# Patient Record
Sex: Female | Born: 1975 | Race: White | Hispanic: No | Marital: Married | Smoking: Never smoker
Health system: Southern US, Community
[De-identification: ages and names within clinical notes are randomized; demographics above are authoritative.]

## PROBLEM LIST (undated history)

## (undated) DIAGNOSIS — C801 Malignant (primary) neoplasm, unspecified: Secondary | ICD-10-CM

## (undated) DIAGNOSIS — N979 Female infertility, unspecified: Secondary | ICD-10-CM

## (undated) DIAGNOSIS — M1990 Unspecified osteoarthritis, unspecified site: Secondary | ICD-10-CM

## (undated) DIAGNOSIS — I739 Peripheral vascular disease, unspecified: Secondary | ICD-10-CM

## (undated) DIAGNOSIS — F329 Major depressive disorder, single episode, unspecified: Secondary | ICD-10-CM

## (undated) DIAGNOSIS — F32A Depression, unspecified: Secondary | ICD-10-CM

## (undated) DIAGNOSIS — E0590 Thyrotoxicosis, unspecified without thyrotoxic crisis or storm: Secondary | ICD-10-CM

## (undated) DIAGNOSIS — E119 Type 2 diabetes mellitus without complications: Secondary | ICD-10-CM

## (undated) DIAGNOSIS — D6851 Activated protein C resistance: Principal | ICD-10-CM

## (undated) DIAGNOSIS — D759 Disease of blood and blood-forming organs, unspecified: Secondary | ICD-10-CM

## (undated) DIAGNOSIS — I10 Essential (primary) hypertension: Secondary | ICD-10-CM

## (undated) DIAGNOSIS — R7303 Prediabetes: Secondary | ICD-10-CM

## (undated) DIAGNOSIS — R600 Localized edema: Secondary | ICD-10-CM

## (undated) DIAGNOSIS — G4730 Sleep apnea, unspecified: Secondary | ICD-10-CM

## (undated) DIAGNOSIS — M549 Dorsalgia, unspecified: Secondary | ICD-10-CM

## (undated) DIAGNOSIS — F419 Anxiety disorder, unspecified: Secondary | ICD-10-CM

## (undated) DIAGNOSIS — R51 Headache: Secondary | ICD-10-CM

## (undated) DIAGNOSIS — E282 Polycystic ovarian syndrome: Secondary | ICD-10-CM

## (undated) HISTORY — DX: Activated protein C resistance: D68.51

## (undated) HISTORY — DX: Essential (primary) hypertension: I10

## (undated) HISTORY — PX: TONSILLECTOMY: SUR1361

## (undated) HISTORY — DX: Major depressive disorder, single episode, unspecified: F32.9

## (undated) HISTORY — DX: Depression, unspecified: F32.A

## (undated) HISTORY — DX: Dorsalgia, unspecified: M54.9

## (undated) HISTORY — DX: Sleep apnea, unspecified: G47.30

## (undated) HISTORY — DX: Female infertility, unspecified: N97.9

## (undated) HISTORY — DX: Localized edema: R60.0

## (undated) HISTORY — PX: DILATION AND CURETTAGE OF UTERUS: SHX78

## (undated) HISTORY — DX: Prediabetes: R73.03

## (undated) HISTORY — DX: Polycystic ovarian syndrome: E28.2

---

## 1997-09-06 ENCOUNTER — Emergency Department (HOSPITAL_COMMUNITY): Admission: EM | Admit: 1997-09-06 | Discharge: 1997-09-06 | Payer: Self-pay | Admitting: Emergency Medicine

## 1998-07-11 ENCOUNTER — Ambulatory Visit (HOSPITAL_COMMUNITY): Admission: RE | Admit: 1998-07-11 | Discharge: 1998-07-11 | Payer: Self-pay | Admitting: Internal Medicine

## 1998-07-11 ENCOUNTER — Encounter: Payer: Self-pay | Admitting: Internal Medicine

## 1999-03-13 ENCOUNTER — Other Ambulatory Visit: Admission: RE | Admit: 1999-03-13 | Discharge: 1999-03-13 | Payer: Self-pay | Admitting: Obstetrics and Gynecology

## 1999-05-10 ENCOUNTER — Emergency Department (HOSPITAL_COMMUNITY): Admission: EM | Admit: 1999-05-10 | Discharge: 1999-05-10 | Payer: Self-pay | Admitting: Emergency Medicine

## 2000-07-28 ENCOUNTER — Ambulatory Visit (HOSPITAL_COMMUNITY): Admission: RE | Admit: 2000-07-28 | Discharge: 2000-07-28 | Payer: Self-pay | Admitting: Internal Medicine

## 2002-08-31 ENCOUNTER — Encounter: Payer: Self-pay | Admitting: Neurology

## 2002-08-31 ENCOUNTER — Ambulatory Visit (HOSPITAL_COMMUNITY): Admission: RE | Admit: 2002-08-31 | Discharge: 2002-08-31 | Payer: Self-pay | Admitting: Neurology

## 2002-09-05 ENCOUNTER — Ambulatory Visit (HOSPITAL_COMMUNITY): Admission: RE | Admit: 2002-09-05 | Discharge: 2002-09-05 | Payer: Self-pay | Admitting: Neurology

## 2002-09-05 ENCOUNTER — Encounter: Payer: Self-pay | Admitting: Neurology

## 2002-09-13 ENCOUNTER — Encounter: Payer: Self-pay | Admitting: Neurology

## 2002-09-13 ENCOUNTER — Ambulatory Visit: Admission: RE | Admit: 2002-09-13 | Discharge: 2002-09-13 | Payer: Self-pay | Admitting: Neurology

## 2003-04-17 ENCOUNTER — Other Ambulatory Visit: Admission: RE | Admit: 2003-04-17 | Discharge: 2003-04-17 | Payer: Self-pay | Admitting: Obstetrics and Gynecology

## 2003-06-06 ENCOUNTER — Encounter: Admission: RE | Admit: 2003-06-06 | Discharge: 2003-07-27 | Payer: Self-pay | Admitting: Internal Medicine

## 2004-05-24 ENCOUNTER — Other Ambulatory Visit: Admission: RE | Admit: 2004-05-24 | Discharge: 2004-05-24 | Payer: Self-pay | Admitting: Obstetrics and Gynecology

## 2005-05-12 ENCOUNTER — Inpatient Hospital Stay (HOSPITAL_COMMUNITY): Admission: AD | Admit: 2005-05-12 | Discharge: 2005-05-12 | Payer: Self-pay | Admitting: Obstetrics and Gynecology

## 2005-05-29 ENCOUNTER — Inpatient Hospital Stay (HOSPITAL_COMMUNITY): Admission: AD | Admit: 2005-05-29 | Discharge: 2005-06-02 | Payer: Self-pay | Admitting: Obstetrics and Gynecology

## 2005-07-10 ENCOUNTER — Other Ambulatory Visit: Admission: RE | Admit: 2005-07-10 | Discharge: 2005-07-10 | Payer: Self-pay | Admitting: Obstetrics and Gynecology

## 2007-10-07 ENCOUNTER — Encounter: Admission: RE | Admit: 2007-10-07 | Discharge: 2007-10-07 | Payer: Self-pay | Admitting: Internal Medicine

## 2007-10-14 ENCOUNTER — Encounter: Admission: RE | Admit: 2007-10-14 | Discharge: 2007-10-14 | Payer: Self-pay | Admitting: Internal Medicine

## 2008-07-05 ENCOUNTER — Encounter: Admission: RE | Admit: 2008-07-05 | Discharge: 2008-07-05 | Payer: Self-pay | Admitting: Obstetrics and Gynecology

## 2009-07-06 ENCOUNTER — Encounter: Admission: RE | Admit: 2009-07-06 | Discharge: 2009-07-06 | Payer: Self-pay | Admitting: Internal Medicine

## 2009-07-31 IMAGING — MG MM SCREEN MAMMOGRAM BILATERAL
6 series · 6 of 6 positions shown · non-contrast
Comparison: none

DG SCREEN MAMMOGRAM BILATERAL
Bilateral CC and MLO view(s) were taken.

DIGITAL SCREENING MAMMOGRAM WITH CAD:
The breast tissue is almost entirely fatty.  No masses or malignant type calcifications are 
identified.

[R CC (1 of 2)]
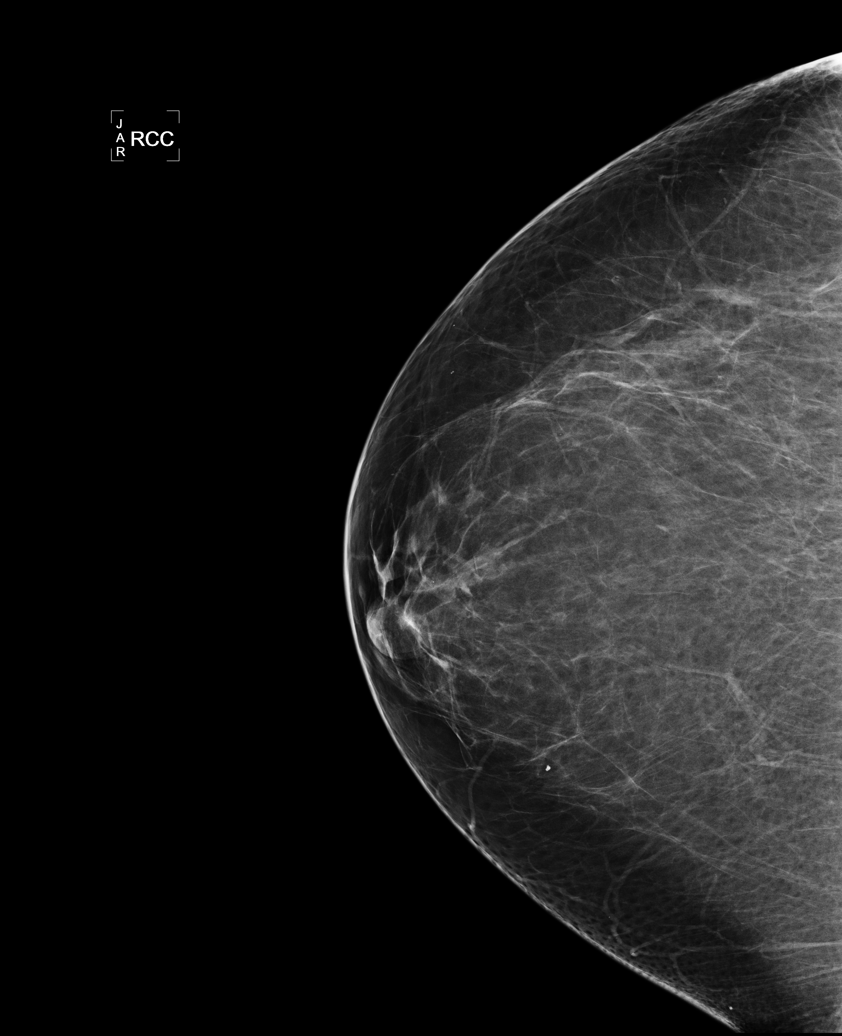

[L CC (1 of 2)]
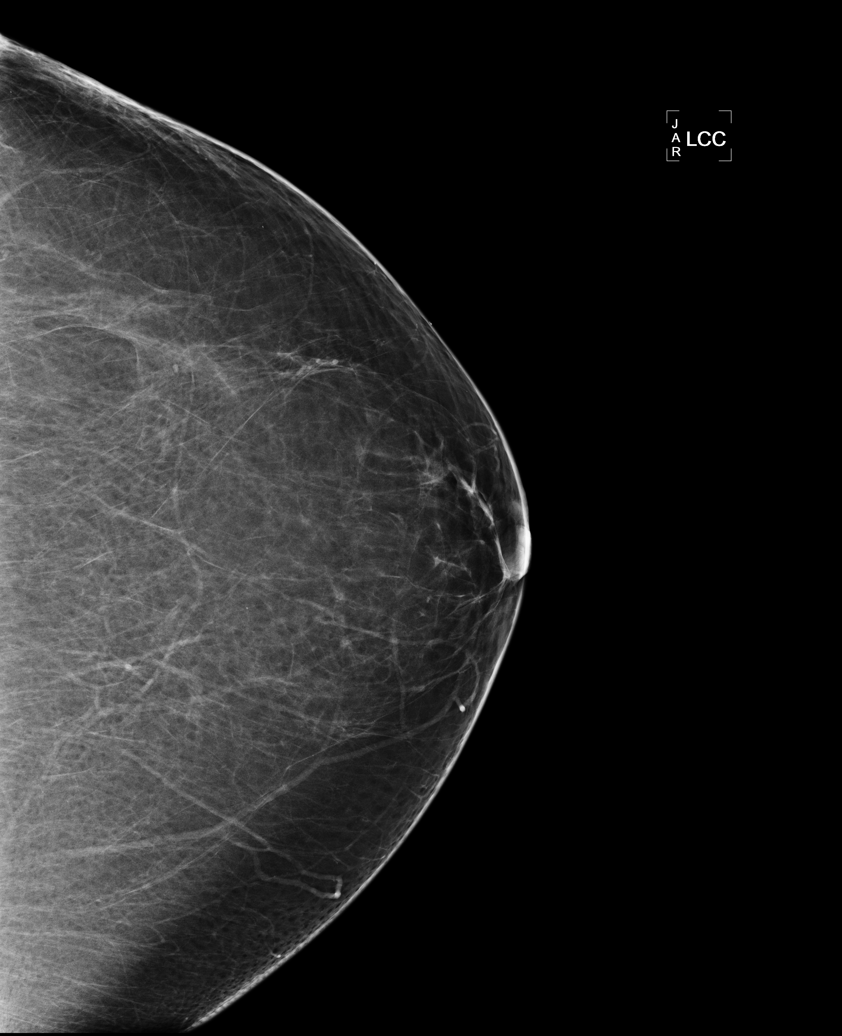

[L MLO]
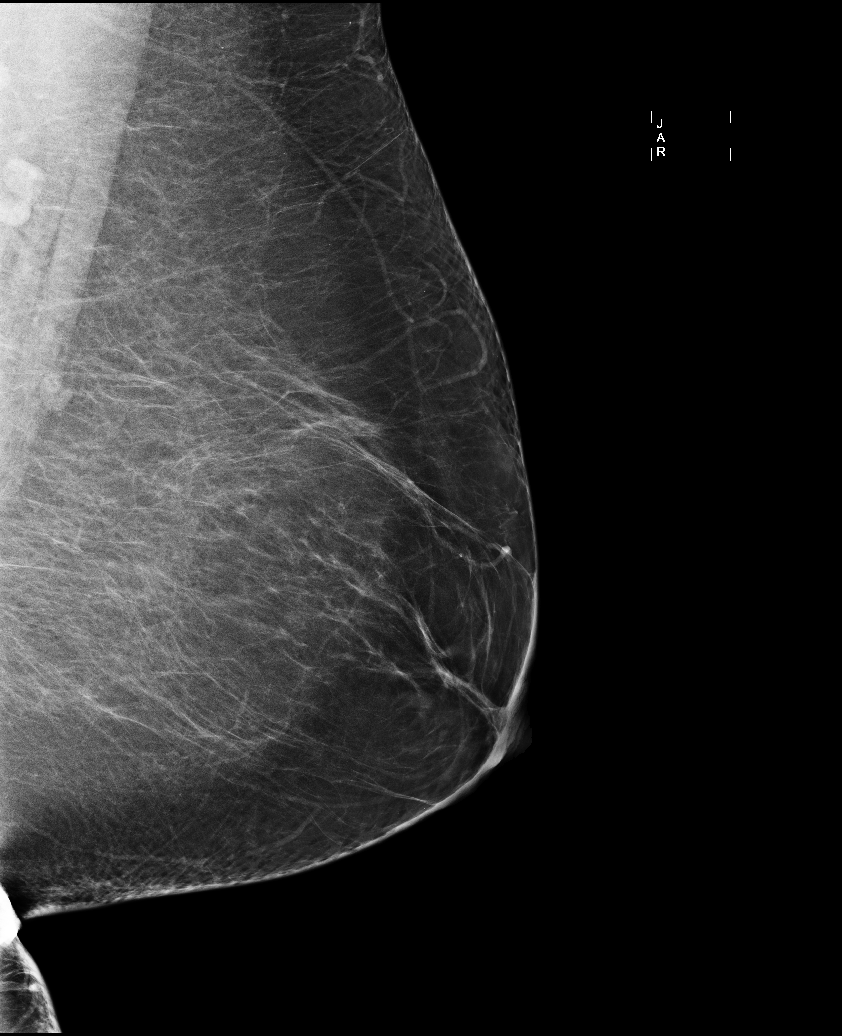

[R MLO]
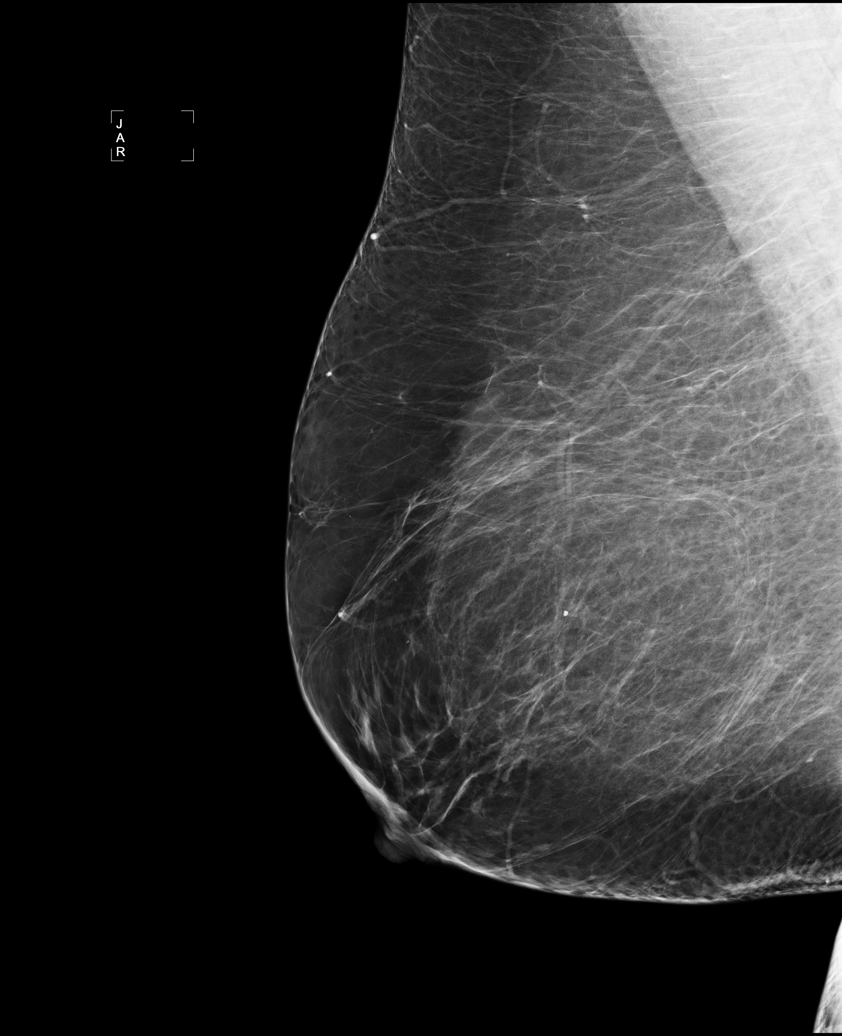

[R CC (2 of 2)]
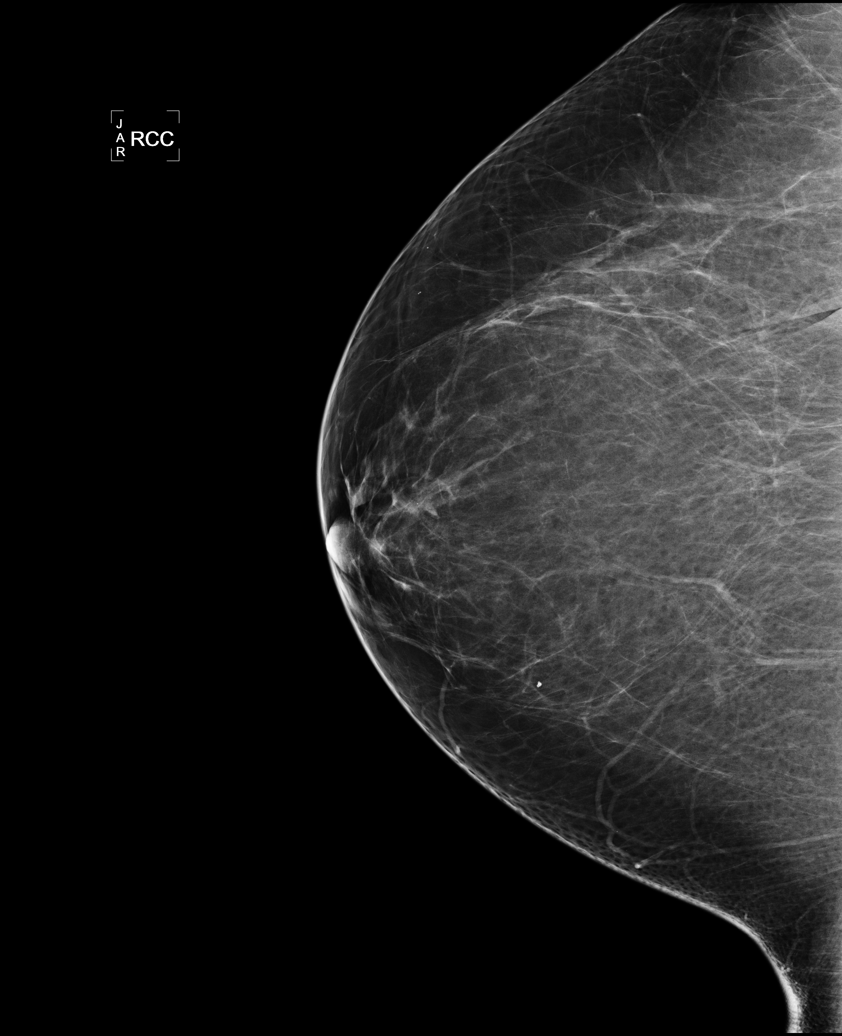

[L CC (2 of 2)]
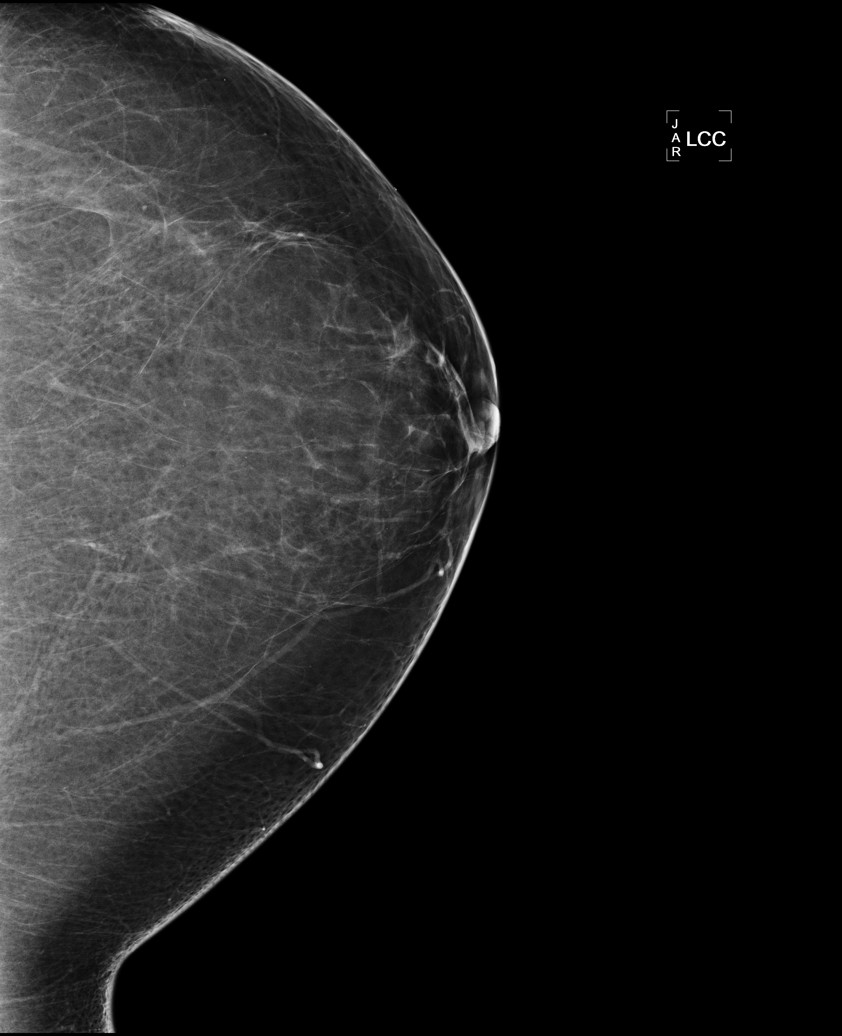

[6 of 6 positions shown; findings below may reference images not displayed]

IMPRESSION: No specific mammographic evidence of malignancy.  Next screening mammogram is recommended in one 
year.

A result letter of this screening mammogram will be mailed directly to the patient.

ASSESSMENT: Negative - BI-RADS 1

Screening mammogram in 1 year.
ANALYZED BY COMPUTER AIDED DETECTION. , THIS PROCEDURE WAS A DIGITAL MAMMOGRAM.

## 2010-04-10 ENCOUNTER — Other Ambulatory Visit: Payer: Self-pay | Admitting: Neurology

## 2010-04-17 ENCOUNTER — Ambulatory Visit
Admission: RE | Admit: 2010-04-17 | Discharge: 2010-04-17 | Disposition: A | Discharge status: Home / Self Care | Payer: Managed Care, Other (non HMO) | Source: Ambulatory Visit | Attending: Neurology | Admitting: Neurology

## 2010-04-17 DIAGNOSIS — R519 Headache, unspecified: Secondary | ICD-10-CM

## 2010-04-17 MED ORDER — GADOBENATE DIMEGLUMINE 529 MG/ML IV SOLN
20.0000 mL | Freq: Once | INTRAVENOUS | Status: AC | PRN
  Administered 2010-04-17: 20 mL via INTRAVENOUS

## 2010-06-17 ENCOUNTER — Other Ambulatory Visit: Payer: Self-pay | Admitting: Internal Medicine

## 2010-06-17 DIAGNOSIS — N63 Unspecified lump in unspecified breast: Secondary | ICD-10-CM

## 2010-06-17 DIAGNOSIS — Z1231 Encounter for screening mammogram for malignant neoplasm of breast: Secondary | ICD-10-CM

## 2010-07-01 ENCOUNTER — Ambulatory Visit
Admission: RE | Admit: 2010-07-01 | Discharge: 2010-07-01 | Disposition: A | Discharge status: Home / Self Care | Payer: Managed Care, Other (non HMO) | Source: Ambulatory Visit | Attending: Internal Medicine | Admitting: Internal Medicine

## 2010-07-01 ENCOUNTER — Other Ambulatory Visit: Payer: Self-pay | Admitting: Internal Medicine

## 2010-07-01 DIAGNOSIS — N63 Unspecified lump in unspecified breast: Secondary | ICD-10-CM

## 2010-07-08 ENCOUNTER — Ambulatory Visit: Payer: Managed Care, Other (non HMO)

## 2010-07-26 NOTE — Op Note (Signed)
Katie Harris, Katie Harris                ACCOUNT NO.:  [id number]   MEDICAL RECORD NO.:  [id number]          PATIENT TYPE:  INP   LOCATION:  9143                          FACILITY:  [facility]   PHYSICIAN:  Huel Cote, M.D. DATE OF BIRTH:  03/30/1975   DATE OF PROCEDURE:  05/29/2005  DATE OF DISCHARGE:                                 OPERATIVE REPORT   PREOPERATIVE DIAGNOSES:  1.  Term pregnancy at 39+ weeks.  2.  Arrest of dilation.  3.  Obesity.  4.  Maternal fever.   POSTOPERATIVE DIAGNOSES:  1.  Term pregnancy at 39+ weeks.  2.  Arrest of dilation.  3.  Obesity.  4.  Maternal fever.   PROCEDURE:  Primary low transverse cesarean section with double layer  closure of the uterus.   SURGEON:  Dr. Huel Cote.   ASSISTANT:  None.   ANESTHESIA:  Spinal.   ESTIMATED BLOOD LOSS:  800 mL.   URINE OUTPUT:  150 mL clear urine.   IV FLUIDS:  2000 mL LR.   FINDINGS:  There is a vigorous female infant, vertex presentation.  Apgars  were 8 and 9.  Weight was 7 pounds 15 ounces.  Normal ovaries and tubes were  noted bilaterally.   PROCEDURE:  The patient was taken to the operating room where epidural  anesthesia was found to be adequate by Allis clamp test.  She was then  prepped and draped in the normal sterile fashion in the dorsal supine  position with a leftward tilt.  Because of her obesity, the abdomen was  taped up to expose the lower abdomen as could best be done.  This provided  nice exposure and the patient was prepped and draped in normal sterile  fashion with a Foley catheter in place.  The incision was then made in a  Pfannenstiel fashion with the scalpel and carried through to the underlying  layer of fascia by both sharp dissection and Bovie cautery.  The fascia was  then opened in the midline, and the incision was extended laterally with  Mayo scissors.  The inferior aspect was grasped with Kocher clamps,  dissected off the rectus muscles.  The superior aspect  was dissected off the  rectus muscles as well.  The peritoneal cavity was then entered bluntly and  the incision extended both superiorly and inferiorly with careful attention  to avoid both bowel, bladder, the peritoneal cavity opened.  The Alexis self-  retaining wound retractor was placed within the incision, and it was ensured  that there was no bowel or omentum underneath the retractor which was then  retracted downward.  The lower uterine segment was then exposed and an  incision made in a transverse fashion with scalpel.  It was noted that the  lower uterine segment was still somewhat thick despite the patient's  laboring for 12-14 hours with adequate contractions.  The incision was then  extended with bandage scissors, and the infant's head was then delivered  atraumatically and bulb suctioned.  The remainder of the infant's body was  delivered and the cord clamped and cut  and handed off to the awaiting  pediatricians.  The placenta was then delivered manually, the uterus cleared  of all clots and debris with moist lap sponge.  The uterine incision was  then closed in 2 layers with 0 chromic.  The first layer a running locked  layer, the second an imbricating layer of the same.  This provided excellent  hemostasis, and no active bleeding was noted.  The uterus was well  contracted.  At this point, the ovaries and tubes were inspected and found  be normal.  The gutters were cleared of all clots and debris.  Any active  bleeding along the fascia was controlled with Bovie cautery, and there was  no active bleeding noted in the subfascial planes.  The fascia was then  closed with 0 Vicryl in a running fashion.  Subcutaneous tissue was closed  with 2-0 plain in a running fashion, and the skin was closed with staples.  Sponge, lap, and needle counts were correct x2, and the patient was taken to  the recovery room in stable condition.      Huel Cote, M.D.  Electronically  Signed     KR/MEDQ  D:  05/30/2005  T:  05/31/2005  Job:  [id number]

## 2010-07-26 NOTE — Discharge Summary (Signed)
Katie Harris, Katie Harris                ACCOUNT NO.:  [id number]   MEDICAL RECORD NO.:  [id number]          PATIENT TYPE:  INP   LOCATION:  9143                          FACILITY:  [facility]   PHYSICIAN:  Huel Cote, M.D. DATE OF BIRTH:  1975/08/29   DATE OF ADMISSION:  05/29/2005  DATE OF DISCHARGE:  06/02/2005                                 DISCHARGE SUMMARY   DISCHARGE DIAGNOSES:  1.  Term pregnancy at 39+ weeks delivered.  2.  Chronic headaches related to pseudotumor cerebri.  3.  Back pain related to herniated disc.  4.  Obesity.  5.  Status post primary low transverse cesarean section.   DISCHARGE MEDICATIONS:  1.  Motrin 600 mg p.o. every 6 hours.  2.  Percocet one to two tablets p.o. every 4 hours.  3.  Protonix 40 mg p.o. daily.   DISCHARGE FOLLOWUP:  The patient is to followup in the office in  approximately 3 days for staple removal.   HOSPITAL COURSE:  The patient is a 35 year old G1, P0 who is admitted at 39+  weeks for induction of labor given chronic headaches, back pain and  favorable cervix status at term.  She had had some borderline blood  pressures but no evidence of preeclampsia.  Indeed, had a history of some  borderline chronic hypertension.  The patient had been requiring some  Vicodin intermittently for her headaches and herniated disk pain, and for  this reason it was felt prudent to proceed with induction of labor.  Prenatal labs are as follows:  O positive, antibody negative, RPR  nonreactive, rubella immune, hepatitis B surface antigen negative, HIV  declined, GC negative, chlamydia negative, group B strep negative, 1-hour  Glucola 146, 3-hour Glucola normal.   PAST OBSTETRICAL HISTORY:  None.   PAST GYNECOLOGICAL HISTORY:  None.   PAST MEDICAL HISTORY:  1.  Pseudotumor cerebri.  2.  Chronic hypertension.  3.  History of a herniated disk.   PAST SURGICAL HISTORY:  Tonsillectomy.   ALLERGIES:  None.   MEDICATIONS:  Vicodin p.r.n. and  Protonix.   She is afebrile with stable vital signs.  Fetal heart rate was reactive.  On  admission cervix was 50, 2-3, and -2 station.  She did have rupture of  membranes performed with internal monitors placed as the baby was not well  traced externally secondary to her body habitus.  The patient was morbidly-  obese with a weight of approximately 330 pounds.  The patient progressed  into adequate labor quite readily with IV Pitocin and by her IUPC had  adequate [place] units.  She reached approximately 5-6 cm at 5:40 p.m.  and at that point began to make extremely slow progress.  Several things  were attempted including increasing Pitocin and position changes to a Sims  position for a possible OP presentation.  The patient was given an  additional 4 hours; however, achieved only one more centimeter of dilation  and no descent of the fetal vertex.  At this point she developed a maternal  temperature to 101.1, and though the fetal status remained overall  reassuring there were some mild variables and some fetal tachycardia noted.  Given her obvious arrest of dilation and possible developing  chorioamnionitis it was felt that it would be the best decision to proceed  with a cesarean section.  The patient was counseled as to risks and benefits  of this procedure carefully and agreed to proceed.  She underwent a primary  low transverse cesarean section with a double layer closure of her uterus  and was delivered of a vigorous female infant in the vertex presentation.  Apgars were 8 and 9, weight was 7 pounds 15 ounces.  She was noted to have  normal ovaries and tubes at the time of C-section.  She was placed on IV  Unasyn both prior to and immediately postoperatively with a good resolution  of her temperature within 12 hours.  Her postoperative hemoglobin was 8.9.  She then did well and postoperative day #4 was tolerating a regular diet,  having no problems with voiding or ambulating, and  was overall doing quite  well.  She did complain of some chest pain on postoperative day #3 which  radiated to her back and was placed on Tums p.r.n. although she had never  restarted her H2 blocker.  This pain did improve significantly at this point  and she was instructed that should it return or become any problem that she  should notify the office immediately.  She had no shortness of breath.  Her  pulse oximetry was 97% on room air and there were no other associated  symptoms with the pain.  Therefore, the patient was felt stable for  discharge home and was discharged on Motrin and Percocet.  She will return  to office in 2-3 days for staple removal of her incision.      Huel Cote, M.D.  Electronically Signed     KR/MEDQ  D:  06/02/2005  T:  06/03/2005  Job:  [id number]

## 2011-05-27 ENCOUNTER — Other Ambulatory Visit: Payer: Self-pay | Admitting: Internal Medicine

## 2011-05-27 DIAGNOSIS — Z1231 Encounter for screening mammogram for malignant neoplasm of breast: Secondary | ICD-10-CM

## 2011-07-02 ENCOUNTER — Other Ambulatory Visit: Payer: Self-pay | Admitting: Internal Medicine

## 2011-07-02 ENCOUNTER — Ambulatory Visit
Admission: RE | Admit: 2011-07-02 | Discharge: 2011-07-02 | Disposition: A | Discharge status: Home / Self Care | Payer: Managed Care, Other (non HMO) | Source: Ambulatory Visit | Attending: Internal Medicine | Admitting: Internal Medicine

## 2011-07-02 DIAGNOSIS — Z1231 Encounter for screening mammogram for malignant neoplasm of breast: Secondary | ICD-10-CM

## 2011-07-08 ENCOUNTER — Ambulatory Visit
Admission: RE | Admit: 2011-07-08 | Discharge: 2011-07-08 | Disposition: A | Discharge status: Home / Self Care | Payer: Managed Care, Other (non HMO) | Source: Ambulatory Visit | Attending: Internal Medicine | Admitting: Internal Medicine

## 2011-07-08 DIAGNOSIS — Z1231 Encounter for screening mammogram for malignant neoplasm of breast: Secondary | ICD-10-CM

## 2012-03-15 ENCOUNTER — Encounter (HOSPITAL_COMMUNITY): Payer: Self-pay | Admitting: Pharmacist

## 2012-03-22 ENCOUNTER — Encounter (HOSPITAL_COMMUNITY)
Admission: RE | Admit: 2012-03-22 | Discharge: 2012-03-22 | Disposition: A | Discharge status: Home / Self Care | Payer: Managed Care, Other (non HMO) | Source: Ambulatory Visit | Attending: Obstetrics and Gynecology | Admitting: Obstetrics and Gynecology

## 2012-03-22 ENCOUNTER — Encounter (HOSPITAL_COMMUNITY): Payer: Self-pay

## 2012-03-22 HISTORY — DX: Thyrotoxicosis, unspecified without thyrotoxic crisis or storm: E05.90

## 2012-03-22 HISTORY — DX: Headache: R51

## 2012-03-22 HISTORY — DX: Essential (primary) hypertension: I10

## 2012-03-22 LAB — CBC
HCT: 38.4 % (ref 36.0–46.0)
Hemoglobin: 13 g/dL (ref 12.0–15.0)
MCH: 29.7 pg (ref 26.0–34.0)
MCHC: 33.9 g/dL (ref 30.0–36.0)
RBC: 4.37 MIL/uL (ref 3.87–5.11)

## 2012-03-22 LAB — BASIC METABOLIC PANEL
BUN: 8 mg/dL (ref 6–23)
CO2: 25 mEq/L (ref 19–32)
Chloride: 100 mEq/L (ref 96–112)
Glucose, Bld: 121 mg/dL — ABNORMAL HIGH (ref 70–99)
Potassium: 3.8 mEq/L (ref 3.5–5.1)

## 2012-03-22 NOTE — Patient Instructions (Addendum)
Your procedure is scheduled on:03/25/12  Enter through the Main Entrance at :6am Pick up desk phone and dial [phone] and inform us of your arrival.  Please call [phone] if you have any problems the morning of surgery.  Remember: Do not eat or drink after midnight:Wed   Take these meds the morning of surgery with a sip of water:BP pill  DO NOT wear jewelry, eye make-up, lipstick,body lotion, or dark fingernail polish. Do not shave for 48 hours prior to surgery.  If you are to be admitted after surgery, leave suitcase in car until your room has been assigned. Patients discharged on the day of surgery will not be allowed to drive home.

## 2012-03-24 ENCOUNTER — Encounter (HOSPITAL_COMMUNITY): Payer: Self-pay | Admitting: Anesthesiology

## 2012-03-24 NOTE — Anesthesia Preprocedure Evaluation (Addendum)
Anesthesia Evaluation  Patient identified by MRN, date of birth, ID band Patient awake    Reviewed: Allergy & Precautions, H&P , NPO status , Patient's Chart, lab work & pertinent test results  Airway Mallampati: III TM Distance: >3 FB Neck ROM: Full    Dental No notable dental hx. (+) Teeth Intact   Pulmonary neg pulmonary ROS,  breath sounds clear to auscultation  Pulmonary exam normal       Cardiovascular hypertension, Pt. on medications and Pt. on home beta blockers Rhythm:Regular Rate:Normal     Neuro/Psych  Headaches, negative psych ROS   GI/Hepatic negative GI ROS, Neg liver ROS,   Endo/Other  Hyperthyroidism Morbid obesity  Renal/GU negative Renal ROS  negative genitourinary   Musculoskeletal negative musculoskeletal ROS (+)   Abdominal (+) + obese,   Peds  Hematology negative hematology ROS (+)   Anesthesia Other Findings   Reproductive/Obstetrics Cervical Polyp                          Anesthesia Physical Anesthesia Plan  ASA: III  Anesthesia Plan: General   Post-op Pain Management:    Induction: Intravenous  Airway Management Planned: LMA  Additional Equipment:   Intra-op Plan:   Post-operative Plan:   Informed Consent: I have reviewed the patients History and Physical, chart, labs and discussed the procedure including the risks, benefits and alternatives for the proposed anesthesia with the patient or authorized representative who has indicated his/her understanding and acceptance.   Dental advisory given  Plan Discussed with: CRNA, Anesthesiologist and Surgeon  Anesthesia Plan Comments:       Anesthesia Quick Evaluation

## 2012-03-24 NOTE — H&P (Signed)
Katie Harris is an 37 y.o. female G1P1  Presents for Hysteroscopy/polypectomy for abnormal uterine bleeding for the last month.  Started post-coital and progressed to daily spotting.  EMB negative and SIUS shos a large endometrial polyp. Pertinent Gynecological History: OB History: G1 P1  2007 C-section 7#15oz   Menstrual History:  No LMP recorded.    Past Medical History  Diagnosis Date  . Hypertension   . Hyperthyroidism     levels were abnormal in Dec- no treatment currently  . Headache     chronic due to pseudo tumor cerebri    Past Surgical History  Procedure Date  . Cesarean section   . Tonsillectomy     No family history on file.  Social History:  reports that she has never smoked. She does not have any smokeless tobacco history on file. She reports that she does not drink alcohol or use illicit drugs.  Allergies: No Known Allergies  No prescriptions prior to admission    ROS  There were no vitals taken for this visit. Physical Exam  Constitutional: She is oriented to person, place, and time. She appears well-developed and well-nourished.  Cardiovascular: Normal rate and regular rhythm.   Respiratory: Effort normal and breath sounds normal.  GI: Soft. Bowel sounds are normal.  Genitourinary: Vagina normal and uterus normal.  Neurological: She is alert and oriented to person, place, and time.  Psychiatric: She has a normal mood and affect. Her behavior is normal.    No results found for this or any previous visit (from the past 24 hour(s)).  No results found.  Assessment/Plan: The pt was counseled ZO:XWRUE and beenfits of procedure including bleeding, infection, and possible uterine perforation.  She will use cytotec prior to procedure.  Oliver Pila 03/24/2012, 5:21 PM

## 2012-03-25 ENCOUNTER — Encounter (HOSPITAL_COMMUNITY)
Admission: RE | Disposition: A | Discharge status: Home / Self Care | Payer: Self-pay | Source: Ambulatory Visit | Attending: Obstetrics and Gynecology

## 2012-03-25 ENCOUNTER — Ambulatory Visit (HOSPITAL_COMMUNITY)
Admission: RE | Admit: 2012-03-25 | Discharge: 2012-03-25 | Disposition: A | Discharge status: Home / Self Care | Payer: Managed Care, Other (non HMO) | Source: Ambulatory Visit | Attending: Obstetrics and Gynecology | Admitting: Obstetrics and Gynecology

## 2012-03-25 ENCOUNTER — Encounter (HOSPITAL_COMMUNITY): Payer: Self-pay | Admitting: Anesthesiology

## 2012-03-25 ENCOUNTER — Ambulatory Visit (HOSPITAL_COMMUNITY): Payer: Managed Care, Other (non HMO) | Admitting: Anesthesiology

## 2012-03-25 DIAGNOSIS — N939 Abnormal uterine and vaginal bleeding, unspecified: Secondary | ICD-10-CM

## 2012-03-25 DIAGNOSIS — N949 Unspecified condition associated with female genital organs and menstrual cycle: Secondary | ICD-10-CM | POA: Insufficient documentation

## 2012-03-25 DIAGNOSIS — Z01812 Encounter for preprocedural laboratory examination: Secondary | ICD-10-CM | POA: Insufficient documentation

## 2012-03-25 DIAGNOSIS — I10 Essential (primary) hypertension: Secondary | ICD-10-CM | POA: Insufficient documentation

## 2012-03-25 DIAGNOSIS — Z01818 Encounter for other preprocedural examination: Secondary | ICD-10-CM | POA: Insufficient documentation

## 2012-03-25 DIAGNOSIS — N938 Other specified abnormal uterine and vaginal bleeding: Secondary | ICD-10-CM | POA: Insufficient documentation

## 2012-03-25 DIAGNOSIS — N840 Polyp of corpus uteri: Secondary | ICD-10-CM | POA: Insufficient documentation

## 2012-03-25 SURGERY — DILATATION & CURETTAGE/HYSTEROSCOPY WITH TRUCLEAR
Anesthesia: Monitor Anesthesia Care | Site: Uterus | Wound class: Clean Contaminated

## 2012-03-25 MED ORDER — GLYCOPYRROLATE 0.2 MG/ML IJ SOLN
INTRAMUSCULAR | Status: AC
  Filled 2012-03-25: qty 1

## 2012-03-25 MED ORDER — KETOROLAC TROMETHAMINE 30 MG/ML IJ SOLN
INTRAMUSCULAR | Status: DC | PRN
  Administered 2012-03-25: 30 mg via INTRAVENOUS

## 2012-03-25 MED ORDER — LACTATED RINGERS IV SOLN
INTRAVENOUS | Status: DC
  Administered 2012-03-25: 07:00:00 via INTRAVENOUS

## 2012-03-25 MED ORDER — KETOROLAC TROMETHAMINE 30 MG/ML IJ SOLN
INTRAMUSCULAR | Status: AC
  Filled 2012-03-25: qty 1

## 2012-03-25 MED ORDER — FENTANYL CITRATE 0.05 MG/ML IJ SOLN: 25.0000 ug | INTRAMUSCULAR | Status: DC | PRN

## 2012-03-25 MED ORDER — EPHEDRINE 5 MG/ML INJ
INTRAVENOUS | Status: AC
  Filled 2012-03-25: qty 10

## 2012-03-25 MED ORDER — SODIUM CHLORIDE 0.9 % IR SOLN
Status: DC | PRN
  Administered 2012-03-25 (×2): 3000 mL

## 2012-03-25 MED ORDER — ONDANSETRON HCL 4 MG/2ML IJ SOLN
INTRAMUSCULAR | Status: DC | PRN
  Administered 2012-03-25: 4 mg via INTRAVENOUS

## 2012-03-25 MED ORDER — PROPOFOL 10 MG/ML IV EMUL
INTRAVENOUS | Status: AC
  Filled 2012-03-25: qty 40

## 2012-03-25 MED ORDER — NORETHINDRONE ACETATE 5 MG PO TABS: 5.0000 mg | ORAL_TABLET | Freq: Every day | ORAL | Status: DC

## 2012-03-25 MED ORDER — ONDANSETRON HCL 4 MG/2ML IJ SOLN
INTRAMUSCULAR | Status: AC
  Filled 2012-03-25: qty 2

## 2012-03-25 MED ORDER — EPHEDRINE SULFATE 50 MG/ML IJ SOLN
INTRAMUSCULAR | Status: DC | PRN
  Administered 2012-03-25: 10 mg via INTRAVENOUS

## 2012-03-25 MED ORDER — FENTANYL CITRATE 0.05 MG/ML IJ SOLN
INTRAMUSCULAR | Status: DC | PRN
  Administered 2012-03-25: 100 ug via INTRAVENOUS

## 2012-03-25 MED ORDER — LIDOCAINE HCL 1 % IJ SOLN
INTRAMUSCULAR | Status: DC | PRN
  Administered 2012-03-25: 20 mL

## 2012-03-25 MED ORDER — INDIGOTINDISULFONATE SODIUM 8 MG/ML IJ SOLN
INTRAMUSCULAR | Status: AC
  Filled 2012-03-25: qty 5

## 2012-03-25 MED ORDER — PROPOFOL 10 MG/ML IV EMUL
INTRAVENOUS | Status: DC | PRN
  Administered 2012-03-25: 320 mg via INTRAVENOUS

## 2012-03-25 MED ORDER — LIDOCAINE HCL (CARDIAC) 20 MG/ML IV SOLN
INTRAVENOUS | Status: DC | PRN
  Administered 2012-03-25: 50 mg via INTRAVENOUS

## 2012-03-25 MED ORDER — GLYCOPYRROLATE 0.2 MG/ML IJ SOLN
INTRAMUSCULAR | Status: DC | PRN
  Administered 2012-03-25: 0.2 mg via INTRAVENOUS

## 2012-03-25 MED ORDER — MIDAZOLAM HCL 5 MG/5ML IJ SOLN
INTRAMUSCULAR | Status: DC | PRN
  Administered 2012-03-25: 2 mg via INTRAVENOUS

## 2012-03-25 MED ORDER — LIDOCAINE HCL (CARDIAC) 20 MG/ML IV SOLN
INTRAVENOUS | Status: AC
  Filled 2012-03-25: qty 5

## 2012-03-25 MED ORDER — FENTANYL CITRATE 0.05 MG/ML IJ SOLN
INTRAMUSCULAR | Status: AC
  Filled 2012-03-25: qty 5

## 2012-03-25 MED ORDER — MIDAZOLAM HCL 2 MG/2ML IJ SOLN
INTRAMUSCULAR | Status: AC
  Filled 2012-03-25: qty 2

## 2012-03-25 MED ORDER — LACTATED RINGERS IV SOLN
INTRAVENOUS | Status: DC
  Administered 2012-03-25: 06:00:00 via INTRAVENOUS

## 2012-03-25 SURGICAL SUPPLY — 21 items
BLADE INCISOR TRUC PLUS 2.9 (ABLATOR) ×1 IMPLANT
CANISTERS HI-FLOW 3000CC (CANNISTER) IMPLANT
CATH ROBINSON RED A/P 16FR (CATHETERS) ×2 IMPLANT
CLOTH BEACON ORANGE TIMEOUT ST (SAFETY) ×2 IMPLANT
CONTAINER PREFILL 10% NBF 60ML (FORM) ×4 IMPLANT
DRAPE HYSTEROSCOPY (DRAPE) ×2 IMPLANT
DRESSING TELFA 8X3 (GAUZE/BANDAGES/DRESSINGS) ×2 IMPLANT
ELECT REM PT RETURN 9FT ADLT (ELECTROSURGICAL)
ELECTRODE REM PT RTRN 9FT ADLT (ELECTROSURGICAL) IMPLANT
GLOVE BIO SURGEON STRL SZ 6.5 (GLOVE) ×2 IMPLANT
GOWN PREVENTION PLUS XLARGE (GOWN DISPOSABLE) ×2 IMPLANT
GOWN STRL REIN XL XLG (GOWN DISPOSABLE) ×4 IMPLANT
INCISOR TRUC PLUS BLADE 2.9 (ABLATOR) ×2
KIT HYSTEROSCOPY TRUCLEAR (ABLATOR) IMPLANT
MORCELLATOR RECIP TRUCLEAR 4.0 (ABLATOR) ×2 IMPLANT
NEEDLE SPNL 22GX3.5 QUINCKE BK (NEEDLE) ×2 IMPLANT
PACK VAGINAL MINOR WOMEN LF (CUSTOM PROCEDURE TRAY) ×2 IMPLANT
PAD OB MATERNITY 4.3X12.25 (PERSONAL CARE ITEMS) ×2 IMPLANT
SYR CONTROL 10ML LL (SYRINGE) ×2 IMPLANT
TOWEL OR 17X24 6PK STRL BLUE (TOWEL DISPOSABLE) ×4 IMPLANT
WATER STERILE IRR 1000ML POUR (IV SOLUTION) ×2 IMPLANT

## 2012-03-25 NOTE — Transfer of Care (Signed)
Immediate Anesthesia Transfer of Care Note  Patient: Katie Harris  Procedure(s) Performed: Procedure(s) (LRB) with comments: DILATATION & CURETTAGE/HYSTEROSCOPY WITH TRUCLEAR (N/A)  Patient Location: PACU  Anesthesia Type:General  Level of Consciousness: awake, alert  and oriented  Airway & Oxygen Therapy: Patient Spontanous Breathing and Patient connected to nasal cannula oxygen  Post-op Assessment: Report given to PACU RN and Post -op Vital signs reviewed and stable  Post vital signs: Reviewed and stable  Complications: No apparent anesthesia complications

## 2012-03-25 NOTE — Anesthesia Postprocedure Evaluation (Signed)
Anesthesia Post Note  Patient: Katie Harris  Procedure(s) Performed: Procedure(s) (LRB): DILATATION & CURETTAGE/HYSTEROSCOPY WITH TRUCLEAR (N/A)  Anesthesia type: General  Patient location: PACU  Post pain: Pain level controlled  Post assessment: Post-op Vital signs reviewed  Last Vitals:  Filed Vitals:   03/25/12 0900  BP: 139/63  Pulse: 77  Temp: 36.8 C  Resp: 16    Post vital signs: Reviewed  Level of consciousness: sedated  Complications: No apparent anesthesia complications

## 2012-03-25 NOTE — Op Note (Signed)
Operative note  Preoperative diagnosis Abnormal uterine bleeding Possible endometrial polyp on ultrasound  Postoperative diagnosis Small endometrial polyps  Procedure Hysteroscopy and truclear resection of small polyps  Surgeon Dr. Huel Cote  Anesthesia LMA Paracervical block  Findings The uterine cavity had small polyps on the posterior surface and some proliferative lining. The cavity otherwise appeared normal  Fluids Estimated blood loss minimal Urine output 50 cc straight catheter prior procedure IV fluid 600 cc LR Hysteroscopic deficit 20 cc  Specimen Endometrial curettings and sampling   Procedure note  After informed consent was obtained from the patient she was taken to the operating room where LMA anesthesia was obtained without difficulty.she was prepped and draped in the normal sterile fashion in the dorsal lithotomy position. An appropriate time out was performed. A speculum was placed within the vagina cervix identified and grasped with a tenaculum on the anterior lip after injection with 1% plain lidocaine. An additional paracervical block was performed with a total of 1% plain lidocaine of 20 cc.  The cervix was partially dilated from her Cytotec use and was easily sounded and dilated with [facility] dilators. The true clear hysteroscope was introduced into the uterine cavity without difficulty and the cavity visualized with findings as previously stated. The true clear blade was then introduced and the polyps removed as well as endometrial sampling performed. At the conclusion of the procedure a curettage was performed as well and all samples were sent to pathology. All instruments and sponges were removed from the vagina and counts were correct. The tenaculum site was treated with silver nitrate for hemostasis. Patient was taken to the recovery room in good condition.

## 2012-03-25 NOTE — Discharge Instructions (Signed)
DISCHARGE INSTRUCTIONS: HYSTEROSCOPY / ENDOMETRIAL ABLATION The following instructions have been prepared to help you care for yourself upon your return home.  MAY TAKE IBUPROFEN/MOTRIN AFTER 2:05PM AS NEEDED FOR CRAMPS  Personal hygiene:  Use sanitary pads for vaginal drainage, not tampons.  Shower the day after your procedure.  NO tub baths, pools or Jacuzzis for 2-3 weeks.  Wipe front to back after using the bathroom.  Activity and limitations:  Do NOT drive or operate any equipment for 24 hours. The effects of anesthesia are still present and drowsiness may result.  Do NOT rest in bed all day.  Walking is encouraged.  Walk up and down stairs slowly.  You may resume your normal activity in one to two days or as indicated by your physician. Sexual activity: NO intercourse for at least 2 weeks after the procedure, or as indicated by your Doctor.  Diet: Eat a light meal as desired this evening. You may resume your usual diet tomorrow.  Return to Work: You may resume your work activities in one to two days or as indicated by Therapist, sports.  What to expect after your surgery: Expect to have vaginal bleeding/discharge for 2-3 days and spotting for up to 10 days. It is not unusual to have soreness for up to 1-2 weeks. You may have a slight burning sensation when you urinate for the first day. Mild cramps may continue for a couple of days. You may have a regular period in 2-6 weeks.  Call your doctor for any of the following:  Excessive vaginal bleeding or clotting, saturating and changing one pad every hour.  Inability to urinate 6 hours after discharge from hospital.  Pain not relieved by pain medication.  Fever of 100.4 F or greater.  Unusual vaginal discharge or odor.  Post Anesthesia Care Unit [phone]

## 2012-03-25 NOTE — Brief Op Note (Signed)
03/25/2012  8:17 AM  PATIENT:  Harless Nakayama  37 y.o. female  PRE-OPERATIVE DIAGNOSIS:  Polyp on [name]  POST-OPERATIVE DIAGNOSIS:  endometrial polyp  PROCEDURE:  Procedure(s) (LRB) with comments: DILATATION & CURETTAGE/HYSTEROSCOPY WITH TRUCLEAR (N/A)  SURGEON:  Surgeon(s) and Role:    * Oliver Pila, MD - Primary    ANESTHESIA:   IV sedation and paracervical block  EBL:  Total I/O In: -  Out: 60 [Urine:50; Blood:10]  BLOOD ADMINISTERED:none  DRAINS: none   LOCAL MEDICATIONS USED:  LIDOCAINE   SPECIMEN:  Endometrial sampling DISPOSITION OF SPECIMEN:  PATHOLOGY  COUNTS:  YES  TOURNIQUET:  * No tourniquets in log *  DICTATION: .Dragon Dictation  PLAN OF CARE: Discharge to home after PACU  PATIENT DISPOSITION:  PACU - hemodynamically stable.

## 2012-03-25 NOTE — Progress Notes (Signed)
Patient ID: Katie Harris, female   DOB: 1975-04-25, 37 y.o.   MRN: [id number] Per pt no changes in dictated H&P.  Brief exam WNL.  Ready to proceed.

## 2012-04-19 ENCOUNTER — Telehealth: Payer: Self-pay | Admitting: Oncology

## 2012-04-19 NOTE — Telephone Encounter (Signed)
LVOM for pt to return call.  °

## 2012-04-20 ENCOUNTER — Telehealth: Payer: Self-pay | Admitting: Oncology

## 2012-04-20 NOTE — Telephone Encounter (Signed)
S/W pt in re NP appt 03/04 @ 3:30 w/Dr. Cyndie Chime.  Referring Dr. Senaida Ores Dx- Factor 5 Welcome packet mailed.

## 2012-04-20 NOTE — Telephone Encounter (Signed)
C/D 04/20/12 for appt. 05/11/12

## 2012-04-21 ENCOUNTER — Encounter: Payer: Self-pay | Admitting: Oncology

## 2012-04-21 ENCOUNTER — Other Ambulatory Visit: Payer: Self-pay | Admitting: Oncology

## 2012-04-21 DIAGNOSIS — D6851 Activated protein C resistance: Secondary | ICD-10-CM | POA: Insufficient documentation

## 2012-04-21 DIAGNOSIS — E0590 Thyrotoxicosis, unspecified without thyrotoxic crisis or storm: Secondary | ICD-10-CM

## 2012-04-21 DIAGNOSIS — I10 Essential (primary) hypertension: Secondary | ICD-10-CM

## 2012-04-21 HISTORY — DX: Activated protein C resistance: D68.51

## 2012-04-21 HISTORY — DX: Thyrotoxicosis, unspecified without thyrotoxic crisis or storm: E05.90

## 2012-04-21 HISTORY — DX: Essential (primary) hypertension: I10

## 2012-04-22 ENCOUNTER — Telehealth: Payer: Self-pay | Admitting: Oncology

## 2012-04-22 NOTE — Telephone Encounter (Signed)
Per 2/12 pof obtain lab appt 2wks prior to 3/4 new pt appt. lmonvm for pt on both cell/home phone re lb appt for 2/19.

## 2012-04-23 ENCOUNTER — Telehealth: Payer: Self-pay | Admitting: Oncology

## 2012-04-23 NOTE — Telephone Encounter (Signed)
Talked to patient, gave her appt for 2/19, unfortunately she cannot make  thatt appt, she will call us Monday to r/s the lab and Financial vis

## 2012-04-24 ENCOUNTER — Other Ambulatory Visit: Payer: Self-pay

## 2012-04-28 ENCOUNTER — Other Ambulatory Visit: Payer: Managed Care, Other (non HMO) | Admitting: Lab

## 2012-04-28 ENCOUNTER — Ambulatory Visit: Payer: Managed Care, Other (non HMO)

## 2012-05-11 ENCOUNTER — Ambulatory Visit: Payer: Managed Care, Other (non HMO)

## 2012-05-11 ENCOUNTER — Ambulatory Visit: Payer: Managed Care, Other (non HMO) | Admitting: Oncology

## 2012-05-11 ENCOUNTER — Other Ambulatory Visit: Payer: Managed Care, Other (non HMO) | Admitting: Lab

## 2012-05-11 ENCOUNTER — Encounter: Payer: Managed Care, Other (non HMO) | Admitting: Oncology

## 2012-09-27 ENCOUNTER — Other Ambulatory Visit: Payer: Self-pay

## 2012-09-27 DIAGNOSIS — Z1231 Encounter for screening mammogram for malignant neoplasm of breast: Secondary | ICD-10-CM

## 2012-10-13 ENCOUNTER — Ambulatory Visit: Payer: Managed Care, Other (non HMO)

## 2012-10-15 ENCOUNTER — Ambulatory Visit
Admission: RE | Admit: 2012-10-15 | Discharge: 2012-10-15 | Disposition: A | Discharge status: Home / Self Care | Payer: Managed Care, Other (non HMO) | Source: Ambulatory Visit

## 2012-10-15 DIAGNOSIS — Z1231 Encounter for screening mammogram for malignant neoplasm of breast: Secondary | ICD-10-CM

## 2013-01-13 ENCOUNTER — Other Ambulatory Visit: Payer: Self-pay

## 2013-09-07 ENCOUNTER — Other Ambulatory Visit: Payer: Self-pay

## 2013-09-07 DIAGNOSIS — Z1231 Encounter for screening mammogram for malignant neoplasm of breast: Secondary | ICD-10-CM

## 2013-10-17 ENCOUNTER — Ambulatory Visit
Admission: RE | Admit: 2013-10-17 | Discharge: 2013-10-17 | Disposition: A | Discharge status: Home / Self Care | Payer: Managed Care, Other (non HMO) | Source: Ambulatory Visit

## 2013-10-17 DIAGNOSIS — Z1231 Encounter for screening mammogram for malignant neoplasm of breast: Secondary | ICD-10-CM

## 2014-08-24 ENCOUNTER — Ambulatory Visit (INDEPENDENT_AMBULATORY_CARE_PROVIDER_SITE_OTHER): Payer: Managed Care, Other (non HMO) | Admitting: Family Medicine

## 2014-08-24 ENCOUNTER — Ambulatory Visit (INDEPENDENT_AMBULATORY_CARE_PROVIDER_SITE_OTHER): Payer: Managed Care, Other (non HMO)

## 2014-08-24 DIAGNOSIS — M62830 Muscle spasm of back: Secondary | ICD-10-CM | POA: Diagnosis not present

## 2014-08-24 DIAGNOSIS — E282 Polycystic ovarian syndrome: Secondary | ICD-10-CM

## 2014-08-24 DIAGNOSIS — M542 Cervicalgia: Secondary | ICD-10-CM | POA: Diagnosis not present

## 2014-08-24 LAB — POCT URINE PREGNANCY: PREG TEST UR: NEGATIVE

## 2014-08-24 MED ORDER — INDOMETHACIN ER 75 MG PO CPCR: 75.0000 mg | ORAL_CAPSULE | Freq: Two times a day (BID) | ORAL | Status: DC

## 2014-08-24 MED ORDER — CYCLOBENZAPRINE HCL 10 MG PO TABS: 10.0000 mg | ORAL_TABLET | Freq: Three times a day (TID) | ORAL | Status: DC | PRN

## 2014-08-24 MED ORDER — HYDROCODONE-ACETAMINOPHEN 5-325 MG PO TABS: 1.0000 | ORAL_TABLET | ORAL | Status: DC | PRN

## 2014-08-24 NOTE — Patient Instructions (Signed)
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 exercises.  Complete these exercises as instructed by your physician, physical therapist or athletic trainer. Progress the resistance and repetitions only as guided.  You may experience muscle soreness or fatigue, but the pain or discomfort you are trying to eliminate should never worsen during these exercises. If this pain does worsen, stop and make certain you are following the directions exactly. If the pain is still present after adjustments, discontinue the exercise until you can discuss the trouble with your clinician. STRENGTHENING - Deep Abdominals, Pelvic Tilt   Lie on a firm bed or floor. Keeping your legs in front of you, bend your knees so they are both pointed toward the ceiling and your feet are flat on the floor.  Tense your lower abdominal muscles to press your low back into the floor. This motion will rotate your pelvis so that your tail bone is scooping upwards rather than pointing at your feet or into the floor.  With a gentle tension and even breathing, hold  this position for __________ seconds. Repeat __________ times. Complete this exercise __________ times per day.  STRENGTHENING - Abdominals, Crunches   Lie on a firm bed or floor. Keeping your legs in front of you, bend your knees so they are both pointed toward the ceiling and your feet are flat  on the floor. Cross your arms over your chest.  Slightly tip your chin down without bending your neck.  Tense your abdominals and slowly lift your trunk high enough to just clear your shoulder blades. Lifting higher can put excessive stress on the low back and does not further strengthen your abdominal muscles.  Control your return to the starting position. Repeat __________ times. Complete this exercise __________ times per day.  STRENGTHENING - Quadruped, Opposite UE/LE Lift   Assume a hands and knees position on a firm surface. Keep your hands under your shoulders and your knees under your hips. You may place padding under your knees for comfort.  Find your neutral spine and gently tense your abdominal muscles so that you can maintain this position. Your shoulders and hips should form a rectangle that is parallel with the floor and is not twisted.  Keeping your trunk steady, lift your right hand no higher than your shoulder and then your left leg no higher than your hip. Make sure you are not holding your breath. Hold this position __________ seconds.  Continuing to keep your abdominal muscles tense and your back steady, slowly return to your starting position. Repeat with the opposite arm and leg. Repeat __________ times. Complete this exercise __________ times per day.  STRENGTHENING - Lower Abdominals, Double Knee Lift  Lie on a firm bed or floor. Keeping your legs in front of you, bend your knees so they are both pointed toward the ceiling and your feet are flat on the floor.  Tense your abdominal muscles to brace your low back and slowly lift both of your knees until they come over your  hips. Be certain not to hold your breath.  Hold __________ seconds. Using your abdominal muscles, return to the starting position in a slow and controlled manner. Repeat __________ times. Complete this exercise __________ times per day.  POSTURE AND BODY MECHANICS CONSIDERATIONS - Spondylolisthesis Keeping correct posture when sitting, standing or completing your activities will reduce the stress put on different body tissues, allowing injured tissues a chance to heal and limiting painful experiences. The following are general guidelines for improved posture. Your physician or physical therapist will provide you with any instructions specific to your needs. While reading these guidelines, remember:  The exercises prescribed by your provider will help you have the flexibility and strength to maintain correct postures.  The correct posture provides the optimal environment for your joints to work. All of your joints have less wear and tear when properly supported by a spine with good posture. This means you will experience a healthier, less painful body.  Correct posture must be practiced with all of your activities, especially prolonged sitting and standing. Correct posture is as important when doing repetitive low-stress activities (typing) as it is when doing a single heavy-load activity (lifting). PROPER SITTING POSTURE In order to minimize stress and discomfort on your spine, you must sit with correct posture. Sitting with good posture should be effortless for a healthy body. Returning to good posture is a gradual process. Many people can work toward this most comfortably by using various supports until they have the flexibility and strength to maintain this posture on their own. When sitting with proper posture, your ears will fall over your shoulders and your shoulders will fall over your hips. You should use the back of the chair to support your upper back. Your low back will be in a neutral  position, just slightly arched. You may place a small  pillow or folded towel at the base of your low back for  support.  When working at a desk, create an environment that supports good, upright posture. Without extra support, muscles fatigue and lead to excessive strain on joints and other tissues. Keep these recommendations in mind: CHAIR:  A chair should be able to slide under your desk when your back makes contact with the back of the chair. This allows you to work closely.  The chair's height should allow your eyes to be level with the upper part of your monitor and your hands to be slightly lower than your elbows. BODY POSITION  Your feet should make contact with the floor. If this is not possible, use a foot rest.  Keep your ears over your shoulders. This will reduce stress on your neck and low back. INCORRECT SITTING POSTURES If you are feeling tired and unable to assume a healthy sitting posture, do not slouch or slump. This puts excessive strain on your back tissues, causing more damage and pain. Healthier options include:  Using more support, like a lumbar pillow.  Switching tasks to something that requires you to be upright or walking.  Taking a brief walk.  Lying down to rest in a neutral-spine position.  CORRECT LIFTING TECHNIQUES DO:   Assume a wide stance. This will provide you more stability and the opportunity to get as close as possible to the object which you are lifting.  Tense your abdominals to brace your spine; then bend at the knees and hips. Keeping your back locked in a neutral-spine position, lift using your leg muscles. Lift with your legs, keeping your back straight.  Test the weight of unknown objects before attempting to lift them.  Try to keep your elbows locked down at your sides in order get the best strength from your shoulders when carrying an object.  Always ask for help when lifting heavy or awkward objects. INCORRECT LIFTING TECHNIQUES DO  NOT:   Lock your knees when lifting, even if it is a small object.  Bend and twist. Pivot at your feet or move your feet when needing to change directions.  Assume that you cannot safely pick up a paperclip without proper posture. Document Released: 02/24/2005 Document Revised: 07/11/2013 Document Reviewed: 06/08/2008 [facility] Patient Information 2015 [place], [place]. This information is not intended to replace advice given to you by your health care provider. Make sure you discuss any questions you have with your health care provider.
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
## 2014-09-07 ENCOUNTER — Telehealth: Payer: Self-pay

## 2014-09-07 DIAGNOSIS — S134XXS Sprain of ligaments of cervical spine, sequela: Secondary | ICD-10-CM

## 2014-09-07 NOTE — Telephone Encounter (Signed)
Referral placed.

## 2014-09-07 NOTE — Telephone Encounter (Signed)
Can we refer? 

## 2014-09-07 NOTE — Telephone Encounter (Signed)
Pt is requesting a referral to a chiropractor. She says she is not feeling any better

## 2014-09-08 NOTE — Telephone Encounter (Signed)
Spoke with pt, advised referral placed.

## 2014-09-15 ENCOUNTER — Other Ambulatory Visit: Payer: Self-pay

## 2014-09-15 DIAGNOSIS — Z1231 Encounter for screening mammogram for malignant neoplasm of breast: Secondary | ICD-10-CM

## 2014-10-23 ENCOUNTER — Ambulatory Visit: Payer: Managed Care, Other (non HMO)

## 2014-11-28 ENCOUNTER — Encounter: Payer: Self-pay | Admitting: Internal Medicine

## 2015-07-12 ENCOUNTER — Ambulatory Visit (INDEPENDENT_AMBULATORY_CARE_PROVIDER_SITE_OTHER): Payer: Managed Care, Other (non HMO) | Admitting: Internal Medicine

## 2015-07-12 ENCOUNTER — Encounter: Payer: Self-pay | Admitting: Internal Medicine

## 2015-07-12 VITALS — BP 138/102 | HR 107 | Ht 68.5 in | Wt 349.6 lb

## 2015-07-12 DIAGNOSIS — E0580 Other thyrotoxicosis without thyrotoxic crisis or storm: Secondary | ICD-10-CM

## 2015-07-12 DIAGNOSIS — E0590 Thyrotoxicosis, unspecified without thyrotoxic crisis or storm: Secondary | ICD-10-CM | POA: Diagnosis not present

## 2015-07-12 NOTE — Progress Notes (Signed)
Patient ID: Katie Harris, female   DOB: [birth year], 40 y.o.   MRN: [id number]   HPI  Katie Harris is a 40 y.o.-year-old female, referred by her PCP, Dr.Shamleffer, for evaluation for and management of  thyrotoxicosis.  Pt has a h/o thyrotoxicosis, dx 11/2014.  I reviewed pt's thyroid tests: 11/07/2014: TSH <0.01, fT4 1.54 (0.61-1.18)  She is on Toprol XL 50 mg in am.   Pt c/o feeling nodules in neck, but no hoarseness, dysphagia/odynophagia, SOB with lying down; she also c/o: - no weight loss - + fatigue - + excessive sweating/heat intolerance - + tremors - + anxiety - + palpitations - no hyperdefecation - + hair thinning and loss  Pt does have a FH of thyroid ds: hypothyroidism in mother, possibly MGM. No FH of thyroid cancer. No h/o radiation tx to head or neck.  No seaweed or kelp, no recent contrast studies.  She is on B6, Boswellia, but was previously on [place], [employer] - stopped 3-4 weeks ago.  + Biotin use - last dose last night.  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 No recent use.  I reviewed her chart and she also has a history of PCOS, pseudotumor cerebri.  ROS: Constitutional: + see HPI Eyes: no blurry vision, no xerophthalmia ENT: no sore throat, + nodules palpated in throat, no dysphagia/odynophagia, + hoarseness, + hypoacusis, + tinnitus Cardiovascular: no CP/SOB/+ palpitations/+ leg swelling Respiratory: +  Cough/no SOB Gastrointestinal: + N/+ V/+ D/no C Musculoskeletal: + muscle/no joint aches Skin: no rashes, + hair loss Neurological: no tremors/numbness/tingling/dizziness, + HA Psychiatric: + both: depression/anxiety  Past Medical History  Diagnosis Date  . Hypertension   . Hyperthyroidism     levels were abnormal in Dec- no treatment currently  . 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 Factor 5 Leiden mutation, heterozygous ([place]) 04/21/2012    Lab 04/16/12  in view of positive Leiden in her father; no personal hx of thrombosis despite prior pregnancy & C-section 2007  . HTN (hypertension), benign 04/21/2012  . Hyperthyroidism 04/21/2012   Past Surgical History  Procedure Laterality Date  . Cesarean section    . Tonsillectomy     Social History   Social History  . Marital Status: Married    Spouse Name: N/A  . Number of Children: 1   Occupational History  . Project coordinator   Social History Main Topics  . Smoking status: Never Smoker   . Smokeless tobacco: Not on file  . Alcohol Use: No  . Drug Use: No   Current Outpatient Prescriptions on File Prior to Visit  Medication Sig Dispense Refill  . cyclobenzaprine (FLEXERIL) 10 MG tablet Take 1 tablet (10 mg total) by mouth 3 (three) times daily as needed for muscle spasms. (Patient taking differently: Take 10 mg by mouth at bedtime. ) 60 tablet 0  . HYDROcodone-acetaminophen (NORCO/VICODIN) 5-325 MG per tablet Take 1-2 tablets by mouth every 4 (four) hours as needed for moderate pain. 45 tablet 0  . indomethacin (INDOCIN SR) 75 MG CR capsule Take 1 capsule (75 mg total) by mouth 2 (two) times daily with a meal. (Patient not taking: Reported on 07/12/2015) 30 capsule 1   No current facility-administered medications on file prior to visit.   No Known Allergies Family History  Problem Relation Age of Onset  . Heart disease Father   . Diabetes Father   . Hypertension  Father   . Hyperlipidemia Father   . Diabetes Maternal Grandmother   . Hyperlipidemia Maternal Grandmother   . Hyperlipidemia Maternal Grandfather   . Hyperlipidemia Paternal Grandmother   . Diabetes Paternal Grandmother   . Cancer Paternal Grandmother   . Heart disease Paternal Grandfather     PE: BP 170/102 mmHg  Pulse 107  Ht 5' 8.5" (1.74 m)  Wt 349 lb 9.6 oz (158.578 kg)  BMI 52.38 kg/m2  SpO2 97%  LMP 07/02/2015 Wt Readings from Last 3 Encounters:  07/12/15 349 lb 9.6 oz (158.578 kg)  08/24/14 353 lb  (160.12 kg)  03/22/12 362 lb (164.202 kg)   Constitutional: obese, in NAD Eyes: PERRLA, EOMI, no exophthalmos, no lid lag, no stare ENT: moist mucous membranes, + thyromegaly - L lobe enlarged, no thyroid bruits, no cervical lymphadenopathy Cardiovascular: tachycardia, RR, No MRG Respiratory: CTA B Gastrointestinal: abdomen soft, NT, ND, BS+ Musculoskeletal: no deformities, strength intact in all 4 Skin: moist, warm, no rashes Neurological: + mild tremor with outstretched hands, DTR normal in all 4  ASSESSMENT: 1. Thyrotoxicosis  PLAN:  1. Patient with a a h/o 2 sets of abnormal TFTs (thyrotoxic profile) - one in 10/2014 and the second in 02/2015 (I only have records of her 10/2014 tests), with thyrotoxic sxs: heat intolerance, tremors, palpitations, anxiety.  - We discussed that possible causes of thyrotoxicosis are:  - lab error  - doubt this as she has congruent sxs and had 2x abnormal test sets - exogenous causes - very likely >> last year, her chiropractor started her on herbal meds, of which 2: Coleus and Boswellia, can stimulate the thyroid. She stopped Coleus 3-4 weeks ago but continues Boswellia. She is also on Biotin, which could artificially lower TSH and increase fT4. I advised her to stop Boswellia from now on, stay away from [facility] and Coleus and hold Biotin x 5-7 days before checking her TFTs - endogenous causes: Graves ds   Thyroiditis toxic multinodular goiter/ toxic adenoma (I cannot feel nodules at palpation of her thyroid). - I suggested that we check the TSH, fT3 and fT4 and also add thyroid stimulating antibodies to screen for Graves' disease- will check in 1 week to give her time off the Biotin. - If the tests remain abnormal, we may need an uptake and scan to differentiate between the 3 above possible etiologies  - we discussed about possible modalities of treatment for the above conditions, to include methimazole use, radioactive iodine ablation or (last resort)  surgery. - I did advice her that we might need to do thyroid ultrasound depending on the results of the uptake and scan (if a cold nodule is present) - I will increase her beta blocker, since she is still tachycardic, tremulous and with High BP - see below - RTC in 3 months, but sooner for repeat labs  Patient Instructions  Please stop Boswellia.  Come back for labs in 1 week. Stop Biotin until then, but you may restart right after labs.  Do not take other herbal supplements for now.  Please increase Toprol XL to 50 mg 2x a day. Let me know if your pulse at home gets <60.   Please come back for a follow-up appointment in 3 months.   Component     Latest Ref Rng 07/19/2015  Triiodothyronine,Free,Serum     2.3 - 4.2 pg/mL 5.2 (H)  T4,Free(Direct)     0.60 - 1.60 ng/dL 1.52  TSH     0.35 - 4.50  uIU/mL 0.05 (L)  TSI     <140 % baseline <89   TFTs are improved and the TSI antibodies are not elevated. This is consistent with improving exogenous thyrotoxicosis. I would like to repeat the thyroid tests in 5-6 weeks after the previous.

## 2015-07-12 NOTE — Progress Notes (Signed)
Pre visit review using our clinic review tool, if applicable. No additional management support is needed unless otherwise documented below in the visit note. 

## 2015-07-12 NOTE — Patient Instructions (Addendum)
Please stop Boswellia.  Come back for labs in 1 week. Stop Biotin until then, but you may restart right after labs.  Do not take other herbal supplements for now.  Please increase Toprol XL to 50 mg 2x a day. Let me know if your pulse at home gets <60.   Please come back for a follow-up appointment in 3 months.  Hyperthyroidism Hyperthyroidism is when the thyroid is too active (overactive). Your thyroid is a large gland that is located in your neck. The thyroid helps to control how your body uses food (metabolism). When your thyroid is overactive, it produces too much of a hormone called thyroxine.  CAUSES Causes of hyperthyroidism may include:  Graves disease. This is when your immune system attacks the thyroid gland. This is the most common cause.  Inflammation of the thyroid gland.  Tumor in the thyroid gland or somewhere else.  Excessive use of thyroid medicines, including:  Prescription thyroid supplement.  Herbal supplements that mimic thyroid hormones.  Solid or fluid-filled lumps within your thyroid gland (thyroid nodules).  Excessive ingestion of iodine. RISK FACTORS  Being female.  Having a family history of thyroid conditions. SIGNS AND SYMPTOMS Signs and symptoms of hyperthyroidism may include:  Nervousness.  Inability to tolerate heat.  Unexplained weight loss.  Diarrhea.  Change in the texture of hair or skin.  Heart skipping beats or making extra beats.  Rapid heart rate.  Loss of menstruation.  Shaky hands.  Fatigue.  Restlessness.  Increased appetite.  Sleep problems.  Enlarged thyroid gland or nodules. DIAGNOSIS  Diagnosis of hyperthyroidism may include:  Medical history and physical exam.  Blood tests.  Ultrasound tests. TREATMENT Treatment may include:  Medicines to control your thyroid.  Surgery to remove your thyroid.  Radiation therapy. HOME CARE INSTRUCTIONS   Take medicines only as directed by your health care  provider.  Do not use any tobacco products, including cigarettes, chewing tobacco, or electronic cigarettes. If you need help quitting, ask your health care provider.  Do not exercise or do physical activity until your health care provider approves.  Keep all follow-up appointments as directed by your health care provider. This is important. SEEK MEDICAL CARE IF:  Your symptoms do not get better with treatment.  You have fever.  You are taking thyroid replacement medicine and you:  Have depression.  Feel mentally and physically slow.  Have weight gain. SEEK IMMEDIATE MEDICAL CARE IF:   You have decreased alertness or a change in your awareness.  You have abdominal pain.  You feel dizzy.  You have a rapid heartbeat.  You have an irregular heartbeat.   This information is not intended to replace advice given to you by your health care provider. Make sure you discuss any questions you have with your health care provider.   Document Released: 02/24/2005 Document Revised: 03/17/2014 Document Reviewed: 07/12/2013 Elsevier Interactive Patient Education [employer].

## 2015-07-19 ENCOUNTER — Other Ambulatory Visit (INDEPENDENT_AMBULATORY_CARE_PROVIDER_SITE_OTHER): Payer: Managed Care, Other (non HMO)

## 2015-07-19 DIAGNOSIS — E0590 Thyrotoxicosis, unspecified without thyrotoxic crisis or storm: Secondary | ICD-10-CM

## 2015-07-19 LAB — TSH: TSH: 0.05 u[IU]/mL — AB (ref 0.35–4.50)

## 2015-07-19 LAB — T3, FREE: T3 FREE: 5.2 pg/mL — AB (ref 2.3–4.2)

## 2015-07-19 LAB — T4, FREE: FREE T4: 1.52 ng/dL (ref 0.60–1.60)

## 2015-07-24 ENCOUNTER — Other Ambulatory Visit: Payer: Self-pay | Admitting: Internal Medicine

## 2015-07-24 DIAGNOSIS — E0590 Thyrotoxicosis, unspecified without thyrotoxic crisis or storm: Secondary | ICD-10-CM | POA: Insufficient documentation

## 2015-07-24 DIAGNOSIS — E0580 Other thyrotoxicosis without thyrotoxic crisis or storm: Secondary | ICD-10-CM

## 2015-07-24 LAB — THYROID STIMULATING IMMUNOGLOBULIN

## 2015-08-31 ENCOUNTER — Other Ambulatory Visit (INDEPENDENT_AMBULATORY_CARE_PROVIDER_SITE_OTHER): Payer: Managed Care, Other (non HMO)

## 2015-08-31 DIAGNOSIS — E0580 Other thyrotoxicosis without thyrotoxic crisis or storm: Secondary | ICD-10-CM

## 2015-08-31 LAB — T4, FREE: Free T4: 1.32 ng/dL (ref 0.60–1.60)

## 2015-08-31 LAB — T3, FREE: T3 FREE: 5 pg/mL — AB (ref 2.3–4.2)

## 2015-08-31 LAB — TSH: TSH: 0.18 u[IU]/mL — AB (ref 0.35–4.50)

## 2015-10-15 ENCOUNTER — Encounter: Payer: Self-pay | Admitting: Internal Medicine

## 2015-10-15 ENCOUNTER — Ambulatory Visit (INDEPENDENT_AMBULATORY_CARE_PROVIDER_SITE_OTHER): Payer: Managed Care, Other (non HMO) | Admitting: Internal Medicine

## 2015-10-15 VITALS — BP 132/84 | HR 72 | Ht 68.0 in | Wt 356.0 lb

## 2015-10-15 DIAGNOSIS — E0590 Thyrotoxicosis, unspecified without thyrotoxic crisis or storm: Secondary | ICD-10-CM | POA: Diagnosis not present

## 2015-10-15 LAB — T4, FREE: Free T4: 1.6 ng/dL (ref 0.60–1.60)

## 2015-10-15 LAB — T3, FREE: T3, Free: 4.5 pg/mL — ABNORMAL HIGH (ref 2.3–4.2)

## 2015-10-15 LAB — TSH: TSH: 0.03 u[IU]/mL — AB (ref 0.35–4.50)

## 2015-10-15 MED ORDER — METHIMAZOLE 5 MG PO TABS: 5.0000 mg | ORAL_TABLET | Freq: Every day | ORAL | 1 refills | Status: DC

## 2015-10-15 NOTE — Progress Notes (Addendum)
Patient ID: Katie Harris, female   DOB: [birth year], 40 y.o.   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 New PCP: Dr. Fara Olden  Pt has a h/o thyrotoxicosis, dx 11/2014. At that time, she was taking several thyrotoxic supplements and also took Biotin. When I saw her last, she was on Biotin, B6 vitamin, Boswellia, but was previously on [place], [employer] - stopped 3-4 weeks prior to the appt. I advised her to stay off all the supplements and hold the Biotin few days before next TFTs.  TFTs started to improve and the TSI antibodies were not elevated. This was consistent with improving exogenous thyrotoxicosis.   I reviewed pt's thyroid tests: Component     Latest Ref Rng & Units 08/31/2015  Triiodothyronine,Free,Serum     2.3 - 4.2 pg/mL 5.0 (H)  T4,Free(Direct)     0.60 - 1.60 ng/dL 1.32  TSH     0.35 - 4.50 uIU/mL 0.18 (L)   Component     Latest Ref Rng 07/19/2015  Triiodothyronine,Free,Serum     2.3 - 4.2 pg/mL 5.2 (H)  T4,Free(Direct)     0.60 - 1.60 ng/dL 1.52  TSH     0.35 - 4.50 uIU/mL 0.05 (L)  TSI     <140 % baseline <89  11/07/2014: TSH <0.01, fT4 1.54 (0.61-1.18)  She is on Toprol XL 50 mg in am >> increased to 75 mg a day by PCP.   Pt c/o feeling nodules in neck, but no hoarseness, dysphagia/odynophagia, SOB with lying down; she denies: - weight gain/loss - fatigue - + excessive sweating/heat intolerance - + tremors - + anxiety - + palpitations  - no hyperdefecation - + hair loss  Pt does have a FH of thyroid ds: hypothyroidism in mother, possibly MGM. No FH of thyroid cancer. No h/o radiation tx to head or neck.  + Biotin use - last dose few days ago.  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 No recent use.  I reviewed her chart and she also has a history of PCOS, pseudotumor  cerebri.  ROS: Constitutional: + see HPI Eyes: no blurry vision, no xerophthalmia ENT: no sore throat, + nodules palpated in throat, no dysphagia/odynophagia, no hoarseness, + hypoacusis, + tinnitus Cardiovascular: no CP/SOB/+ palpitations/+ leg swelling Respiratory: no Cough/no SOB Gastrointestinal: no N/V/D/C Musculoskeletal: + muscle/no joint aches Skin: no rashes, + hair loss Neurological: no tremors/numbness/tingling/dizziness, + HA  Past Medical History:  Diagnosis Date  . Factor 5 Leiden mutation, heterozygous ([place]) 04/21/2012   Lab 04/16/12 in view of positive Leiden in her father; no personal hx of thrombosis despite prior pregnancy & C-section 2007  . 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 HTN (hypertension), benign 04/21/2012  . Hypertension   . Hyperthyroidism    levels were abnormal in Dec- no treatment currently  . Hyperthyroidism 04/21/2012   Past Surgical History:  Procedure Laterality Date  . CESAREAN SECTION    . TONSILLECTOMY     Social History   Social History  . Marital Status: Married    Spouse Name: N/A  . Number of Children: 1   Occupational History  . Project coordinator   Social History Main Topics  . Smoking status: Never Smoker   . Smokeless tobacco: Not on file  . Alcohol Use: No  .  Drug Use: No   Current Outpatient Prescriptions on File Prior to Visit  Medication Sig Dispense Refill  . cyclobenzaprine (FLEXERIL) 10 MG tablet Take 1 tablet (10 mg total) by mouth 3 (three) times daily as needed for muscle spasms. (Patient taking differently: Take 10 mg by mouth at bedtime. ) 60 tablet 0  . DULoxetine (CYMBALTA) 60 MG capsule Take 60 mg by mouth at bedtime.  6  . Multiple Vitamin (MULTIVITAMIN) tablet Take 1 tablet by mouth daily.    Marland Kitchen HYDROcodone-acetaminophen (NORCO/VICODIN) 5-325 MG per tablet Take 1-2 tablets by mouth every 4 (four) hours as needed for moderate pain. (Patient not taking: Reported on 10/15/2015) 45 tablet 0   . indomethacin (INDOCIN SR) 75 MG CR capsule Take 1 capsule (75 mg total) by mouth 2 (two) times daily with a meal. (Patient not taking: Reported on 07/12/2015) 30 capsule 1  . metoprolol succinate (TOPROL-XL) 50 MG 24 hr tablet Take 50 mg by mouth at bedtime.  11   No current facility-administered medications on file prior to visit.    No Known Allergies Family History  Problem Relation Age of Onset  . Heart disease Father   . Diabetes Father   . Hypertension Father   . Hyperlipidemia Father   . Diabetes Maternal Grandmother   . Hyperlipidemia Maternal Grandmother   . Hyperlipidemia Maternal Grandfather   . Hyperlipidemia Paternal Grandmother   . Diabetes Paternal Grandmother   . Cancer Paternal Grandmother   . Heart disease Paternal Grandfather     PE: BP 132/84 (BP Location: Right Arm, Patient Position: Sitting)   Pulse 72   Ht 5\' 8"  (1.727 m)   Wt (!) 356 lb (161.5 kg)   LMP 10/15/2015   SpO2 98%   BMI 54.13 kg/m  Wt Readings from Last 3 Encounters:  10/15/15 (!) 356 lb (161.5 kg)  07/12/15 (!) 349 lb 9.6 oz (158.6 kg)  08/24/14 (!) 353 lb (160.1 kg)   Constitutional: obese, in NAD Eyes: PERRLA, EOMI, no exophthalmos, no lid lag, no stare ENT: moist mucous membranes, + thyromegaly - L lobe enlarged, no thyroid bruits, no cervical lymphadenopathy Cardiovascular: tachycardia, RR, No MRG Respiratory: CTA B Gastrointestinal: abdomen soft, NT, ND, BS+ Musculoskeletal: no deformities, strength intact in all 4 Skin: moist, warm, no rashes Neurological: + mild tremor with outstretched hands, DTR normal in all 4  ASSESSMENT: 1. Thyrotoxicosis  PLAN:  1. Patient with a a h/o 2 sets of abnormal TFTs (thyrotoxic profile) - one in 10/2014 and the second in 02/2015 (I only have records of her 10/2014 tests), with thyrotoxic sxs: heat intolerance, tremors, palpitations, anxiety.  - in 2016 she started several herbal meds, of which 2: Coleus and Boswellia, can stimulate the  thyroid. We stopped Boswellia and she had already stopped Coleus 3-4 weeks prior to our last visit. She was also on Biotin, which could artificially lower TSH and increase fT4 >> advised her to hold Biotin 5 days before labs >> she did not take this since last visit. Her TFTs have progressively improved after stopping the supplements. She still has hot flushes and tremors, although better. - If the tests remain abnormal, we may need an uptake and scan to differentiate between the 3 above possible etiologies  - we again discussed about possible modalities of treatment for the hyperthyroid conditions, to include methimazole use, radioactive iodine ablation or (last resort) surgery. - we might need to do thyroid ultrasound depending on the results of the uptake and scan (if  a cold nodule is present) - continue beta blocker - RTC in 4 months, but sooner for repeat labs  Component     Latest Ref Rng & Units 10/15/2015  Triiodothyronine,Free,Serum     2.3 - 4.2 pg/mL 4.5 (H)  T4,Free(Direct)     0.60 - 1.60 ng/dL 1.60  TSH     0.35 - 4.50 uIU/mL 0.03 (L)   TFTs are still abnormal. At this point, I would like to obtain an uptake and scan. Since she is symptomatic, with palpitations, heat intolerance and tremors, will also start low-dose methimazole, 5 mg daily which we would need to stop 4 days prior to the uptake and scan and resume it afterwards. I plan to obtain a new set of TFTs 6 weeks after starting methimazole.  NM THYROID SNG UPTAKE W/IMAGING  Order: [id number]  Status:  Final result Visible to patient:  No (Not Released) Dx:  Thyrotoxicosis without thyroid storm,...  Details   Reading Physician Reading Date Result Priority  Lavonia Dana, MD 10/25/2015   Narrative    CLINICAL DATA: Thyrotoxicosis without thyroid storm  EXAM: THYROID SCAN AND UPTAKE - 24 HOURS  TECHNIQUE: Following the per oral administration of I-131 sodium iodide, the patient returned at 24 hours and uptake  measurements were acquired with the uptake probe centered on the neck. Thyroid imaging was performed following the intravenous administration of the Tc-46m Pertechnetate.  RADIOPHARMACEUTICALS: 6.1 MicroCuries I-131 sodium iodide orally and 10.1 mCi Technetium-48m pertechnetate IV  COMPARISON: None  FINDINGS: 24 hour radio iodine uptake is calculated at 42%, above the normal range compatible with hyperthyroidism.  Images of the thyroid gland in 3 projections demonstrate a large hot nodule occupying the majority of the LEFT lobe compatible with a thyroid adenoma.  Suppression of uptake in RIGHT lobe.  IMPRESSION: Elevated 24 hour radio iodine uptake of 42%.  Large hot nodule within LEFT thyroid lobe with suppression of uptake within the RIGHT lobe consistent with a toxic adenoma.   Electronically Signed By: Lavonia Dana M.D. 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 Will suggest RAI treatment. We'll stop the methimazole one week prior to this.   Philemon Kingdom, MD PhD [facility]

## 2015-10-15 NOTE — Patient Instructions (Signed)
Please stop at the lab.  Please come back for a follow-up appointment in 4 months.   

## 2015-10-24 ENCOUNTER — Ambulatory Visit (HOSPITAL_COMMUNITY)
Admission: RE | Admit: 2015-10-24 | Discharge: 2015-10-24 | Disposition: A | Discharge status: Home / Self Care | Payer: Managed Care, Other (non HMO) | Source: Ambulatory Visit | Attending: Internal Medicine | Admitting: Internal Medicine

## 2015-10-24 MED ORDER — SODIUM IODIDE I 131 CAPSULE
6.1000 | Freq: Once | INTRAVENOUS | Status: AC | PRN
  Administered 2015-10-24: 6.1 via ORAL

## 2015-10-25 ENCOUNTER — Encounter (HOSPITAL_COMMUNITY)
Admission: RE | Admit: 2015-10-25 | Discharge: 2015-10-25 | Disposition: A | Discharge status: Home / Self Care | Payer: Managed Care, Other (non HMO) | Source: Ambulatory Visit | Attending: Internal Medicine | Admitting: Internal Medicine

## 2015-10-25 DIAGNOSIS — E0590 Thyrotoxicosis, unspecified without thyrotoxic crisis or storm: Secondary | ICD-10-CM | POA: Diagnosis present

## 2015-10-25 MED ORDER — SODIUM PERTECHNETATE TC 99M INJECTION
10.1000 | Freq: Once | INTRAVENOUS | Status: AC | PRN
Start: 2015-10-25 — End: 2015-10-25
  Administered 2015-10-25: 10.1 via INTRAVENOUS

## 2015-10-26 ENCOUNTER — Encounter: Payer: Self-pay | Admitting: Internal Medicine

## 2015-10-30 ENCOUNTER — Encounter: Payer: Self-pay | Admitting: Internal Medicine

## 2015-10-30 ENCOUNTER — Other Ambulatory Visit: Payer: Self-pay | Admitting: Internal Medicine

## 2015-10-30 DIAGNOSIS — E0510 Thyrotoxicosis with toxic single thyroid nodule without thyrotoxic crisis or storm: Secondary | ICD-10-CM | POA: Insufficient documentation

## 2015-11-09 ENCOUNTER — Encounter (HOSPITAL_COMMUNITY): Payer: Self-pay

## 2015-11-09 ENCOUNTER — Encounter (HOSPITAL_COMMUNITY)
Admission: RE | Admit: 2015-11-09 | Discharge: 2015-11-09 | Disposition: A | Discharge status: Home / Self Care | Payer: Managed Care, Other (non HMO) | Source: Ambulatory Visit | Attending: Internal Medicine | Admitting: Internal Medicine

## 2015-11-09 DIAGNOSIS — E0510 Thyrotoxicosis with toxic single thyroid nodule without thyrotoxic crisis or storm: Secondary | ICD-10-CM | POA: Insufficient documentation

## 2015-11-09 LAB — HCG, SERUM, QUALITATIVE: Preg, Serum: NEGATIVE

## 2015-11-23 ENCOUNTER — Encounter: Payer: Self-pay | Admitting: Internal Medicine

## 2015-12-21 ENCOUNTER — Encounter: Payer: Self-pay | Admitting: Internal Medicine

## 2015-12-21 ENCOUNTER — Other Ambulatory Visit (INDEPENDENT_AMBULATORY_CARE_PROVIDER_SITE_OTHER): Payer: Managed Care, Other (non HMO)

## 2015-12-21 DIAGNOSIS — E0590 Thyrotoxicosis, unspecified without thyrotoxic crisis or storm: Secondary | ICD-10-CM

## 2015-12-21 LAB — T3, FREE: T3 FREE: 3.8 pg/mL (ref 2.3–4.2)

## 2015-12-21 LAB — TSH: TSH: 0.11 u[IU]/mL — AB (ref 0.35–4.50)

## 2015-12-21 LAB — T4, FREE: FREE T4: 0.89 ng/dL (ref 0.60–1.60)

## 2016-02-14 ENCOUNTER — Ambulatory Visit: Payer: Managed Care, Other (non HMO) | Admitting: Internal Medicine

## 2016-03-28 ENCOUNTER — Encounter: Payer: Self-pay | Admitting: Internal Medicine

## 2016-03-28 NOTE — Telephone Encounter (Signed)
Pt has confirmed the appt move from Tuesday to Monday at 430. Pt is moved

## 2016-03-31 ENCOUNTER — Ambulatory Visit (INDEPENDENT_AMBULATORY_CARE_PROVIDER_SITE_OTHER): Payer: Managed Care, Other (non HMO) | Admitting: Internal Medicine

## 2016-03-31 ENCOUNTER — Encounter: Payer: Self-pay | Admitting: Internal Medicine

## 2016-03-31 VITALS — BP 124/84 | HR 89 | Wt 365.0 lb

## 2016-03-31 DIAGNOSIS — E0510 Thyrotoxicosis with toxic single thyroid nodule without thyrotoxic crisis or storm: Secondary | ICD-10-CM | POA: Diagnosis not present

## 2016-03-31 NOTE — Patient Instructions (Signed)
Please stop at the lab.  If we need to start levothyroxine, take the thyroid hormone every day, with water, at least 30 minutes before breakfast, separated by at least 4 hours from: - acid reflux medications - calcium - iron - multivitamins  Please come back for a follow-up appointment in 4 months.  

## 2016-03-31 NOTE — Progress Notes (Signed)
Patient ID: SUMIE NESTA, female   DOB: 1975/11/16, 41 y.o.   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 Last visit 5 mo ago. PCP: Dr. Fara Olden  Reviewed and addended hx: Pt has a h/o thyrotoxicosis, dx 11/2014. At that time, she was taking several thyrotoxic supplements and also took Biotin. When I saw her last, she was on Biotin, B6 vitamin, Boswellia, but was previously on [employer], [employer] - stopped 3-4 weeks prior to the appt. I advised her to stay off all the supplements and hold the Biotin few days before next TFTs.  TSI antibodies were not elevated.   TFTs continued to fluctuate with suppressed TSH and elevated free T3.  At last visit, I suggested a Thyroid Uptake and scan which she had on 10/25/2015: Elevated 24 hour radio iodine uptake of 42%.  Large hot nodule within LEFT thyroid lobe with suppression of uptake within the RIGHT lobe consistent with a toxic adenoma.  11/09/2015: RAI tx  I reviewed pt's thyroid tests - last set of TFTs 1 mo after RAI tx >> better: Lab Results  Component Value Date   TSH 0.11 (L) 12/21/2015   TSH 0.03 (L) 10/15/2015   TSH 0.18 (L) 08/31/2015   TSH 0.05 (L) 07/19/2015   FREET4 0.89 12/21/2015   FREET4 1.60 10/15/2015   FREET4 1.32 08/31/2015   FREET4 1.52 07/19/2015   Full panels: Component     Latest Ref Rng & Units 08/31/2015  Triiodothyronine,Free,Serum     2.3 - 4.2 pg/mL 5.0 (H)  T4,Free(Direct)     0.60 - 1.60 ng/dL 1.32  TSH     0.35 - 4.50 uIU/mL 0.18 (L)   Component     Latest Ref Rng 07/19/2015  Triiodothyronine,Free,Serum     2.3 - 4.2 pg/mL 5.2 (H)  T4,Free(Direct)     0.60 - 1.60 ng/dL 1.52  TSH     0.35 - 4.50 uIU/mL 0.05 (L)  TSI     <140 % baseline <89  11/07/2014: TSH <0.01, fT4 1.54 (0.61-1.18)  She was on Toprol XL 75 mg a day in am >> decreased to 50 mg   Pt c/o feeling nodules  in neck, but no hoarseness, dysphagia/odynophagia, SOB with lying down; she c/o: - + fatigue  - felt more hot flushes in last 2 weeks - + softer stools in the last week - + weight gain - + fatigue - resolved tremors - resolved anxiety - rosolved palpitations  - no hyperdefecation - + hair loss  Pt does have a FH of thyroid ds: hypothyroidism in mother, possibly MGM. No FH of thyroid cancer. No h/o radiation tx to head or neck. No Biotin use.  She was on Prednisone in the past, for a prolonged period of time for chondrocondritis in the past. No recent use.  I reviewed her chart and she also has a history of PCOS, pseudotumor cerebri.  ROS: Constitutional: + see HPI Eyes: no blurry vision, no xerophthalmia ENT: no sore throat, + nodules palpated in throat, no dysphagia/odynophagia, no hoarseness, + hypoacusis, + tinnitus Cardiovascular: no CP/SOB/palpitations/leg swelling Respiratory: no Cough/no SOB Gastrointestinal: no N/V/D/C Musculoskeletal: no muscle/no joint aches Skin: no rashes, + hair loss Neurological: no tremors/numbness/tingling/dizziness, + HA  Past Medical History:  Diagnosis Date  . Factor 5 Leiden mutation, heterozygous ([place]) 04/21/2012   Lab 04/16/12 in view of positive Leiden in her father; no  personal hx of thrombosis despite prior pregnancy & C-section 2007  . 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 HTN (hypertension), benign 04/21/2012  . Hypertension   . Hyperthyroidism    levels were abnormal in Dec- no treatment currently  . Hyperthyroidism 04/21/2012   Past Surgical History:  Procedure Laterality Date  . CESAREAN SECTION    . TONSILLECTOMY     Social History   Social History  . Marital Status: Married    Spouse Name: N/A  . Number of Children: 1   Occupational History  . Project coordinator   Social History Main Topics  . Smoking status: Never Smoker   . Smokeless tobacco: Not on file  . Alcohol Use: No  . Drug Use: No    Current Outpatient Prescriptions on File Prior to Visit  Medication Sig Dispense Refill  . cyclobenzaprine (FLEXERIL) 10 MG tablet Take 1 tablet (10 mg total) by mouth 3 (three) times daily as needed for muscle spasms. (Patient taking differently: Take 10 mg by mouth at bedtime. ) 60 tablet 0  . DULoxetine (CYMBALTA) 60 MG capsule Take 60 mg by mouth at bedtime.  6  . HYDROcodone-acetaminophen (NORCO/VICODIN) 5-325 MG per tablet Take 1-2 tablets by mouth every 4 (four) hours as needed for moderate pain. (Patient not taking: Reported on 10/15/2015) 45 tablet 0  . indomethacin (INDOCIN SR) 75 MG CR capsule Take 1 capsule (75 mg total) by mouth 2 (two) times daily with a meal. (Patient not taking: Reported on 07/12/2015) 30 capsule 1  . methimazole (TAPAZOLE) 5 MG tablet Take 1 tablet (5 mg total) by mouth daily. 60 tablet 1  . metoprolol (LOPRESSOR) 50 MG tablet     . metoprolol succinate (TOPROL-XL) 50 MG 24 hr tablet Take 50 mg by mouth at bedtime.  11  . Multiple Vitamin (MULTIVITAMIN) tablet Take 1 tablet by mouth daily.     No current facility-administered medications on file prior to visit.    No Known Allergies Family History  Problem Relation Age of Onset  . Heart disease Father   . Diabetes Father   . Hypertension Father   . Hyperlipidemia Father   . Diabetes Maternal Grandmother   . Hyperlipidemia Maternal Grandmother   . Hyperlipidemia Maternal Grandfather   . Hyperlipidemia Paternal Grandmother   . Diabetes Paternal Grandmother   . Cancer Paternal Grandmother   . Heart disease Paternal Grandfather     PE: BP 124/84 (BP Location: Right Arm, Patient Position: Sitting)   Pulse 89   Wt (!) 365 lb (165.6 kg)   LMP 02/22/2016   SpO2 98%   BMI 55.50 kg/m  Wt Readings from Last 3 Encounters:  03/31/16 (!) 365 lb (165.6 kg)  10/15/15 (!) 356 lb (161.5 kg)  07/12/15 (!) 349 lb 9.6 oz (158.6 kg)   Constitutional: obese, in NAD Eyes: PERRLA, EOMI, no exophthalmos, no lid lag,  no stare ENT: moist mucous membranes, + thyromegaly - L lobe full, no cervical lymphadenopathy Cardiovascular: RRR, No MRG Respiratory: CTA B Gastrointestinal: abdomen soft, NT, ND, BS+ Musculoskeletal: no deformities, strength intact in all 4 Skin: moist, warm, no rashes Neurological: + very slight tremor with outstretched hands, DTR normal in all 4  ASSESSMENT: 1. Toxic thyroid nodule  PLAN:  1. 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 TFTs improved 1 mo later, but she did not come for labs afterwards as advised - we will check TFTs today >> may need LT4 - we discussed how to take this correctly in case we need to start: every day, with water, at least 30 minutes before breakfast, separated by at least 4 hours from: - acid reflux medications - calcium - iron - multivitamins - continue beta blocker (this was started before, for HTN) - RTC in 4 months, but sooner for repeat labs  Component     Latest Ref Rng & Units 03/31/2016  Triiodothyronine,Free,Serum     2.3 - 4.2 pg/mL 2.7  T4,Free(Direct)     0.60 - 1.60 ng/dL 0.55 (L)  TSH     0.35 - 4.50 uIU/mL 2.37   TSH and fT3 normal, but free t4 low. Will need to continue to follow them closely >> repeat in 6 weeks.  Philemon Kingdom, MD PhD [facility]

## 2016-04-01 ENCOUNTER — Ambulatory Visit: Payer: Managed Care, Other (non HMO) | Admitting: Internal Medicine

## 2016-04-01 LAB — T4, FREE: Free T4: 0.55 ng/dL — ABNORMAL LOW (ref 0.60–1.60)

## 2016-04-01 LAB — TSH: TSH: 2.37 u[IU]/mL (ref 0.35–4.50)

## 2016-04-01 LAB — T3, FREE: T3 FREE: 2.7 pg/mL (ref 2.3–4.2)

## 2016-06-03 ENCOUNTER — Other Ambulatory Visit: Payer: Managed Care, Other (non HMO)

## 2016-07-09 ENCOUNTER — Encounter (HOSPITAL_COMMUNITY): Payer: Self-pay | Admitting: *Deleted

## 2016-07-09 ENCOUNTER — Emergency Department (HOSPITAL_COMMUNITY)
Admission: EM | Admit: 2016-07-09 | Discharge: 2016-07-09 | Disposition: A | Discharge status: Home / Self Care | Payer: Managed Care, Other (non HMO) | Attending: Emergency Medicine | Admitting: Emergency Medicine

## 2016-07-09 ENCOUNTER — Emergency Department (HOSPITAL_COMMUNITY): Payer: Managed Care, Other (non HMO)

## 2016-07-09 DIAGNOSIS — R109 Unspecified abdominal pain: Secondary | ICD-10-CM | POA: Diagnosis present

## 2016-07-09 DIAGNOSIS — Z79899 Other long term (current) drug therapy: Secondary | ICD-10-CM | POA: Diagnosis not present

## 2016-07-09 DIAGNOSIS — I10 Essential (primary) hypertension: Secondary | ICD-10-CM | POA: Insufficient documentation

## 2016-07-09 DIAGNOSIS — R1033 Periumbilical pain: Secondary | ICD-10-CM | POA: Diagnosis not present

## 2016-07-09 LAB — CBC WITH DIFFERENTIAL/PLATELET
BASOS PCT: 1 %
Basophils Absolute: 0.1 10*3/uL (ref 0.0–0.1)
EOS ABS: 0.2 10*3/uL (ref 0.0–0.7)
Eosinophils Relative: 2 %
HCT: 37.3 % (ref 36.0–46.0)
Hemoglobin: 12.3 g/dL (ref 12.0–15.0)
Lymphocytes Relative: 27 %
Lymphs Abs: 2.5 10*3/uL (ref 0.7–4.0)
MCH: 30.1 pg (ref 26.0–34.0)
MCHC: 33 g/dL (ref 30.0–36.0)
MCV: 91.2 fL (ref 78.0–100.0)
MONO ABS: 0.5 10*3/uL (ref 0.1–1.0)
MONOS PCT: 5 %
Neutro Abs: 5.9 10*3/uL (ref 1.7–7.7)
Neutrophils Relative %: 65 %
Platelets: 264 10*3/uL (ref 150–400)
RBC: 4.09 MIL/uL (ref 3.87–5.11)
RDW: 13.5 % (ref 11.5–15.5)
WBC: 9 10*3/uL (ref 4.0–10.5)

## 2016-07-09 LAB — COMPREHENSIVE METABOLIC PANEL
ALBUMIN: 3.5 g/dL (ref 3.5–5.0)
ALK PHOS: 65 U/L (ref 38–126)
ALT: 22 U/L (ref 14–54)
AST: 23 U/L (ref 15–41)
Anion gap: 6 (ref 5–15)
BUN: 6 mg/dL (ref 6–20)
CALCIUM: 9 mg/dL (ref 8.9–10.3)
CO2: 27 mmol/L (ref 22–32)
CREATININE: 0.65 mg/dL (ref 0.44–1.00)
Chloride: 104 mmol/L (ref 101–111)
GFR calc non Af Amer: >60 mL/min (ref >60)
GLUCOSE: 99 mg/dL (ref 65–99)
Potassium: 3.9 mmol/L (ref 3.5–5.1)
SODIUM: 137 mmol/L (ref 135–145)
Total Bilirubin: 0.6 mg/dL (ref 0.3–1.2)
Total Protein: 6.7 g/dL (ref 6.5–8.1)

## 2016-07-09 LAB — POC URINE PREG, ED: PREG TEST UR: NEGATIVE

## 2016-07-09 MED ORDER — DOXYCYCLINE HYCLATE 100 MG PO CAPS: 100.0000 mg | ORAL_CAPSULE | Freq: Two times a day (BID) | ORAL | 0 refills | Status: DC

## 2016-07-09 MED ORDER — HYDROCODONE-ACETAMINOPHEN 5-325 MG PO TABS: 1.0000 | ORAL_TABLET | Freq: Four times a day (QID) | ORAL | 0 refills | Status: DC | PRN

## 2016-07-09 MED ORDER — IOPAMIDOL (ISOVUE-300) INJECTION 61%
INTRAVENOUS | Status: AC
  Administered 2016-07-09: 100 mL
  Filled 2016-07-09: qty 100

## 2016-07-09 MED ORDER — MORPHINE SULFATE (PF) 4 MG/ML IV SOLN
4.0000 mg | Freq: Once | INTRAVENOUS | Status: AC
  Administered 2016-07-09: 4 mg via INTRAVENOUS
  Filled 2016-07-09: qty 1

## 2016-07-09 NOTE — ED Triage Notes (Signed)
Pt states last nite was leaning over to get groceries and felt a sharp stabbing pain to belly button, no bleeding.  Then got in shower and it started bleeding.  She has had belly button infections before.

## 2016-07-09 NOTE — ED Provider Notes (Signed)
[facility] DEPT Provider Note   CSN: [id number] Arrival date & time: 07/09/16  1016     History   Chief Complaint Chief Complaint  Patient presents with  . Abdominal Pain    HPI Katie Harris is a 41 y.o. female.  The history is provided by the patient and medical records. No language interpreter was used.  Abdominal Pain   Associated symptoms include nausea.   Katie Harris is a 41 y.o. female  with a PMH of Factor V Leiden who presents to the Emergency Department complaining of periumbilical pain since yesterday around 5 pm. Patient states that she was in her car when she leaned over across the center console to grab something that was falling. When this occurred, she felt sharp periumbilical pain. Pain was worse with any type of movement. She has a history of infections to the belly button, therefore looked at the site very thoroughly and felt as if everything was fine from that standpoint. Late last night, she took a shower and noted bleeding from the site. Bleeding has persisted throughout the night and into the morning. She scheduled an appointment with her primary doctor who referred her to the emergency department for further evaluation, recommending possible CT scan. No fever or chills. Intermittently feels nauseous, but feels as if this is more related to the pain. No emesis. No back pain. No medications taken today for her symptoms. She feels as if pain is better when lying completely still. This does feel different from prior periumbilical infections.   Past Medical History:  Diagnosis Date  . Factor 5 Leiden mutation, heterozygous ([place]) 04/21/2012   Lab 04/16/12 in view of positive Leiden in her father; no personal hx of thrombosis despite prior pregnancy & C-section 2007  . 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 HTN (hypertension), benign 04/21/2012  . Hypertension   . Hyperthyroidism    levels were abnormal in Dec- no treatment currently  .  Hyperthyroidism 04/21/2012    Patient Active Problem List   Diagnosis Date Noted  . 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 Thyrotoxicosis 07/24/2015  . Factor 5 Leiden mutation, heterozygous ([place]) 04/21/2012  . HTN (hypertension), benign 04/21/2012    Past Surgical History:  Procedure Laterality Date  . CESAREAN SECTION    . TONSILLECTOMY      OB History    No data available       Home Medications    Prior to Admission medications   Medication Sig Start Date End Date Taking? Authorizing Provider  cyclobenzaprine (FLEXERIL) 10 MG tablet Take 1 tablet (10 mg total) by mouth 3 (three) times daily as needed for muscle spasms. Patient taking differently: Take 10 mg by mouth at bedtime.  08/24/14  Yes Shawnee Knapp, MD  DULoxetine (CYMBALTA) 60 MG capsule Take 60 mg by mouth at bedtime. 06/29/15  Yes Historical Provider, MD  metoprolol (LOPRESSOR) 50 MG tablet Take 50 mg by mouth 2 (two) times daily.  10/14/15  Yes Historical Provider, MD  Multiple Vitamin (MULTIVITAMIN) tablet Take 1 tablet by mouth daily.   Yes Historical Provider, MD  doxycycline (VIBRAMYCIN) 100 MG capsule Take 1 capsule (100 mg total) by mouth 2 (two) times daily. 07/09/16   Ozella Almond Ward, PA-C  HYDROcodone-acetaminophen (NORCO/VICODIN) 5-325 MG per tablet Take 1-2 tablets by mouth every 4 (four) hours as needed for moderate pain. Patient not taking: Reported on 10/15/2015 08/24/14   Shawnee Knapp, MD  indomethacin (INDOCIN  SR) 75 MG CR capsule Take 1 capsule (75 mg total) by mouth 2 (two) times daily with a meal. Patient not taking: Reported on 07/12/2015 08/24/14   Shawnee Knapp, MD  metoprolol succinate (TOPROL-XL) 50 MG 24 hr tablet Take 50 mg by mouth at bedtime. 06/09/15   Historical Provider, MD    Family History Family History  Problem Relation Age of Onset  . Heart disease Father   . Diabetes Father   . Hypertension Father   . Hyperlipidemia Father   . Diabetes Maternal Grandmother   . Hyperlipidemia Maternal  Grandmother   . Hyperlipidemia Maternal Grandfather   . Hyperlipidemia Paternal Grandmother   . Diabetes Paternal Grandmother   . Cancer Paternal Grandmother   . Heart disease Paternal Grandfather     Social History Social History  Substance Use Topics  . Smoking status: Never Smoker  . Smokeless tobacco: Never Used  . Alcohol use No     Allergies   Patient has no known allergies.   Review of Systems Review of Systems  Gastrointestinal: Positive for abdominal pain and nausea.  Skin: Positive for wound (Bleeding from belly button).  All other systems reviewed and are negative.    Physical Exam Updated Vital Signs BP 122/82   Pulse 76   Temp 99.1 F (37.3 C) (Oral)   Resp 16   LMP 05/24/2016 Comment: Pt having abnormal bleeding  SpO2 98%   Physical Exam  Constitutional: She is oriented to person, place, and time. She appears well-developed and well-nourished. No distress.  HENT:  Head: Normocephalic and atraumatic.  Cardiovascular: Normal rate, regular rhythm and normal heart sounds.   No murmur heard. Pulmonary/Chest: Effort normal and breath sounds normal. No respiratory distress.  Abdominal: Soft. Bowel sounds are normal. She exhibits no distension. There is tenderness.  Periumbilical erythema which is mildly warm to touch and tender to palpation. She does have bleeding from the umbilicus as well as small amount of purulent discharge noted with Q-tip placed deep.   Musculoskeletal: She exhibits no edema.  Neurological: She is alert and oriented to person, place, and time.  Skin: Skin is warm and dry.  Nursing note and vitals reviewed.    ED Treatments / Results  Labs (all labs ordered are listed, but only abnormal results are displayed) Labs Reviewed  COMPREHENSIVE METABOLIC PANEL  CBC WITH DIFFERENTIAL/PLATELET  POC URINE PREG, ED    EKG  EKG Interpretation None       Radiology Ct Abdomen Pelvis W Contrast  Result Date: 07/09/2016 CLINICAL  DATA:  Periumbilical region pain EXAM: CT ABDOMEN AND PELVIS WITH CONTRAST TECHNIQUE: Multidetector CT imaging of the abdomen and pelvis was performed using the standard protocol following bolus administration of intravenous contrast. CONTRAST:  1104mL ISOVUE-300 IOPAMIDOL (ISOVUE-300) INJECTION 61% COMPARISON:  None. FINDINGS: Lower chest: Lung bases are clear. Hepatobiliary: There is hepatic steatosis. No focal liver lesions are evident. Gallbladder wall is not appreciably thickened. There is no biliary duct dilatation. Pancreas: No pancreatic mass or inflammatory focus. Spleen: No splenic lesions are evident. There is a small accessory spleen medial to the spleen inferiorly. Adrenals/Urinary Tract: Adrenals appear unremarkable bilaterally. There is a 1 x 1 cm parapelvic cyst on the left. There is no appreciable hydronephrosis on either side. There is no renal or ureteral calculus on either side. Urinary bladder is midline with wall thickness within normal limits. Stomach/Bowel: There is no bowel wall or mesenteric thickening. There is no bowel obstruction. No free air or portal  venous air. Vascular/Lymphatic: There is mild calcification in the aorta and proximal common iliac arteries. No aneurysm evident. Major mesenteric vessels appear patent. There is no appreciable adenopathy in the abdomen or pelvis. Reproductive: Uterus is anteverted. There is no pelvic mass or pelvic fluid collection. Other: Appendix appears diminutive. There is no periappendiceal region inflammation. No ascites or abscess evident in the abdomen or pelvis. There is a small ventral hernia containing only fat. Musculoskeletal: There is scarring in the periumbilical region. There are no blastic or lytic bone lesions. There is no intramuscular lesion. IMPRESSION: No bowel obstruction or bowel wall thickening. No abscess. No periappendiceal region inflammation. No renal or ureteral calculus.  No hydronephrosis. There is hepatic steatosis without  focal liver lesion. There is a small ventral hernia containing only fat. There is scarring in the periumbilical region. Electronically Signed   By: Lowella Grip III M.D.   On: 07/09/2016 14:48    Procedures Procedures (including critical care time)  Medications Ordered in ED Medications  morphine 4 MG/ML injection 4 mg (4 mg Intravenous Given 07/09/16 1413)  iopamidol (ISOVUE-300) 61 % injection (100 mLs  Contrast Given 07/09/16 1430)     Initial Impression / Assessment and Plan / ED Course  I have reviewed the triage vital signs and the nursing notes.  Pertinent labs & imaging results that were available during my care of the patient were reviewed by me and considered in my medical decision making (see chart for details).    Katie Harris is a 40 y.o. female with hx of Factor V Leiden who presents to ED for periumbilical pain with bleeding and discharge noted. Seen by PCP who sent patient to ER for further evaluation. Labs wdl. Afebrile and hemodynamically stable. CT reassuring with no abscess or intra-abdominal bleeding. There is scarring noted in them. Umbilical region. She does have very well demarcated area of cellulitis around the umbilicus. This was marked with a skin marker. Will treat with doxycycline and have patient follow up with PCP for wound check in 2 days. Patient understands reasons to return to ER including fevers, spreading redness, new/worsening symptoms. All questions answered.   Patient discussed with Dr. Venora Maples who agrees with treatment plan.    Final Clinical Impressions(s) / ED Diagnoses   Final diagnoses:  Periumbilical pain  Omphalitis    New Prescriptions New Prescriptions   DOXYCYCLINE (VIBRAMYCIN) 100 MG CAPSULE    Take 1 capsule (100 mg total) by mouth 2 (two) times daily.     [facility] Ward, PA-C 07/09/16 [place], MD 07/09/16 1540

## 2016-07-09 NOTE — ED Notes (Signed)
Pt ambulated to restroom to collect urine sample.

## 2016-07-09 NOTE — Discharge Instructions (Signed)
It was my pleasure taking care of you today!   Please take all of your antibiotics until finished!   Please keep area clean and dry. We have marked your infection site. If the redness spreads outside of this marked line despite antibiotic use, please return to ER as this is a sign infection is getting worse. Also return to ER for fevers, new or worsening symptoms, any additional concerns.

## 2016-07-29 ENCOUNTER — Ambulatory Visit (INDEPENDENT_AMBULATORY_CARE_PROVIDER_SITE_OTHER): Payer: Managed Care, Other (non HMO) | Admitting: Internal Medicine

## 2016-07-29 VITALS — BP 124/84 | HR 94 | Wt 378.0 lb

## 2016-07-29 DIAGNOSIS — E0510 Thyrotoxicosis with toxic single thyroid nodule without thyrotoxic crisis or storm: Secondary | ICD-10-CM | POA: Diagnosis not present

## 2016-07-29 DIAGNOSIS — E0580 Other thyrotoxicosis without thyrotoxic crisis or storm: Secondary | ICD-10-CM

## 2016-07-29 NOTE — Progress Notes (Signed)
Patient ID: ZAMZAM WHINERY, female   DOB: 03/24/1975, 41 y.o.   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 Last visit 4 months ago. PCP: Dr. Fara Olden  Reviewed and addended hx: Pt has a h/o thyrotoxicosis, dx 11/2014. At that time, she was taking several thyrotoxic supplements and also took Biotin. When I saw her last, she was on Biotin, B6 vitamin, [place], but was previously on [place], [employer] - stopped 3-4 weeks prior to the appt. I advised her to stay off all the supplements and hold the Biotin few days before next TFTs.  TSI antibodies were not elevated.   TFTs continued to fluctuate but she continued to have suppressed TSH and elevated free T3.  Thyroid Uptake and scan which she had on 10/25/2015: Elevated 24 hour radio iodine uptake of 42%.  Large hot nodule within LEFT thyroid lobe with suppression of uptake within the RIGHT lobe consistent with a toxic adenoma.   11/09/2015: RAI tx  I reviewed pt's thyroid tests - last set of TFTs 1 mo after RAI tx >> better: Lab Results  Component Value Date   TSH 2.37 03/31/2016   TSH 0.11 (L) 12/21/2015   TSH 0.03 (L) 10/15/2015   TSH 0.18 (L) 08/31/2015   TSH 0.05 (L) 07/19/2015   FREET4 0.55 (L) 03/31/2016   FREET4 0.89 12/21/2015   FREET4 1.60 10/15/2015   FREET4 1.32 08/31/2015   FREET4 1.52 07/19/2015   T3FREE 2.7 03/31/2016   T3FREE 3.8 12/21/2015   T3FREE 4.5 (H) 10/15/2015   T3FREE 5.0 (H) 08/31/2015   T3FREE 5.2 (H) 07/19/2015  11/07/2014: TSH <0.01, fT4 1.54 (0.61-1.18)  Component     Latest Ref Rng 07/19/2015  TSI     <140 % baseline <89   She was on Toprol XL 75 mg a day in am >> now on 50 mg 2x a day.  Pt c/o: - + Fatigue  - + weight gain - + palpitations (pulse high at rest occasionally) - + in last 1.5 mo >> more hot flushes - no diarrhea - resolved tremors - resolved anxiety, + depession - no  hyperdefecation - no hair loss - + irreg menses  Pt does have a FH of thyroid ds: hypothyroidism in mother, possibly MGM. No FH of thyroid cancer. No h/o radiation tx to head or neck.  No seaweed or kelp. No recent contrast studies. No herbal supplements. No Biotin use. No recent steroids use.   She also has a history of PCOS and pseudotumor cerebri.  ROS: Constitutional:+ see HPI Eyes: no blurry vision, no xerophthalmia ENT: no sore throat, no nodules palpated in throat, no dysphagia, no odynophagia, no hoarseness Cardiovascular: no CP/no SOB/+ palpitations/no leg swelling Respiratory: no cough/no SOB/no wheezing Gastrointestinal: no N/no V/no D/no C/no acid reflux Musculoskeletal: no muscle aches/no joint aches Skin: no rashes, no hair loss Neurological: no tremors/no numbness/no tingling/no dizziness  I reviewed pt's medications, allergies, PMH, social hx, family hx, and changes were documented in the history of present illness. Otherwise, unchanged from my initial visit note.  Past Medical History:  Diagnosis Date  . Factor 5 Leiden mutation, heterozygous ([place]) 04/21/2012   Lab 04/16/12 in view of positive Leiden in her father; no personal hx of thrombosis despite prior pregnancy & C-section 2007  . 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 HTN (hypertension), benign 04/21/2012  . Hypertension   .  Hyperthyroidism    levels were abnormal in Dec- no treatment currently  . Hyperthyroidism 04/21/2012   Past Surgical History:  Procedure Laterality Date  . CESAREAN SECTION    . TONSILLECTOMY     Social History   Social History  . Marital Status: Married    Spouse Name: N/A  . Number of Children: 1   Occupational History  . Project coordinator   Social History Main Topics  . Smoking status: Never Smoker   . Smokeless tobacco: Not on file  . Alcohol Use: No  . Drug Use: No   Current Outpatient Prescriptions on File Prior to Visit  Medication Sig  Dispense Refill  . cyclobenzaprine (FLEXERIL) 10 MG tablet Take 1 tablet (10 mg total) by mouth 3 (three) times daily as needed for muscle spasms. (Patient taking differently: Take 10 mg by mouth at bedtime. ) 60 tablet 0  . doxycycline (VIBRAMYCIN) 100 MG capsule Take 1 capsule (100 mg total) by mouth 2 (two) times daily. 20 capsule 0  . DULoxetine (CYMBALTA) 60 MG capsule Take 60 mg by mouth at bedtime.  6  . HYDROcodone-acetaminophen (NORCO/VICODIN) 5-325 MG tablet Take 1 tablet by mouth every 6 (six) hours as needed for severe pain. 8 tablet 0  . indomethacin (INDOCIN SR) 75 MG CR capsule Take 1 capsule (75 mg total) by mouth 2 (two) times daily with a meal. (Patient not taking: Reported on 07/12/2015) 30 capsule 1  . metoprolol (LOPRESSOR) 50 MG tablet Take 50 mg by mouth 2 (two) times daily.     . metoprolol succinate (TOPROL-XL) 50 MG 24 hr tablet Take 50 mg by mouth at bedtime.  11  . Multiple Vitamin (MULTIVITAMIN) tablet Take 1 tablet by mouth daily.     No current facility-administered medications on file prior to visit.    No Known Allergies Family History  Problem Relation Age of Onset  . Heart disease Father   . Diabetes Father   . Hypertension Father   . Hyperlipidemia Father   . Diabetes Maternal Grandmother   . Hyperlipidemia Maternal Grandmother   . Hyperlipidemia Maternal Grandfather   . Hyperlipidemia Paternal Grandmother   . Diabetes Paternal Grandmother   . Cancer Paternal Grandmother   . Heart disease Paternal Grandfather     PE: BP 124/84 (BP Location: Left Arm, Patient Position: Sitting)   Pulse 94   Wt (!) 378 lb (171.5 kg)   LMP 07/16/2016   SpO2 98%   BMI 57.47 kg/m  Wt Readings from Last 3 Encounters:  07/29/16 (!) 378 lb (171.5 kg)  03/31/16 (!) 365 lb (165.6 kg)  10/15/15 (!) 356 lb (161.5 kg)   Constitutional: obese, in NAD Eyes: PERRLA, EOMI, no exophthalmos ENT: moist mucous membranes, + thyromegaly - Palpable left thyroid nodule, no cervical  lymphadenopathy Cardiovascular: RRR, No MRG Respiratory: CTA B Gastrointestinal: abdomen soft, NT, ND, BS+ Musculoskeletal: no deformities, strength intact in all 4 Skin: moist, warm, no rashes Neurological: no tremor with outstretched hands, DTR normal in all 4   ASSESSMENT: 1. Toxic thyroid nodule - s/p ablation  PLAN:  1. Patient with a h/o thyrotoxicosis, with a recently found left toxic adenoma, now status post RAI treatment in 11/2015. Subsequent TFTs were normal in 03/2016, except for a slightly low free T4. She did not return for labs since then, unfortunately. At this visit, she complains of weight gain (she gained 13 pounds since last visit), fatigue, tachycardia, anxiety intolerance. We discussed that this is a combination of  thyrotoxic and hypothyroid symptoms. - We will check her TFTs today to see if she needs thyroid hormone supplementation. In this case, we discussed about how to take her levothyroxine correctly: Every day, with water, at least 30 minutes before breakfast, separated by at least 4 hours from acid reflux medicines, calcium, iron, multivitamins. - She continues on the blockers she still has tachycardia so we'll not stop. However, these were started before, for high blood pressure. - I will see her back in 4 months, but most likely sooner for repeat labs   Office Visit on 07/29/2016  Component Date Value Ref Range Status  . TSH 07/29/2016 2.78  0.35 - 4.50 uIU/mL Final  . Free T4 07/29/2016 0.72  0.60 - 1.60 ng/dL Final   Comment: Specimens from patients who are undergoing biotin therapy and /or ingesting biotin supplements may contain high levels of biotin.  The higher biotin concentration in these specimens interferes with this Free T4 assay.  Specimens that contain high levels  of biotin may cause false high results for this Free T4 assay.  Please interpret results in light of the total clinical presentation of the patient.    . T3, Free 07/29/2016 3.3  2.3 -  4.2 pg/mL Final   Normal TFTs >> will repeat in 4 mo.  Philemon Kingdom, MD PhD [facility]

## 2016-07-29 NOTE — Patient Instructions (Signed)
Please stop at the lab.  If we need to start Levothyroxine, Take the thyroid hormone every day, with water, at least 30 minutes before breakfast, separated by at least 4 hours from: - acid reflux medications - calcium - iron - multivitamins  Please return in 4 months.

## 2016-07-30 ENCOUNTER — Encounter: Payer: Self-pay | Admitting: Internal Medicine

## 2016-07-30 LAB — T3, FREE: T3 FREE: 3.3 pg/mL (ref 2.3–4.2)

## 2016-07-30 LAB — T4, FREE: FREE T4: 0.72 ng/dL (ref 0.60–1.60)

## 2016-07-30 LAB — TSH: TSH: 2.78 u[IU]/mL (ref 0.35–4.50)

## 2016-10-09 ENCOUNTER — Encounter (HOSPITAL_COMMUNITY)
Admission: RE | Admit: 2016-10-09 | Discharge: 2016-10-09 | Disposition: A | Discharge status: Home / Self Care | Payer: Managed Care, Other (non HMO) | Source: Ambulatory Visit | Attending: Obstetrics and Gynecology | Admitting: Obstetrics and Gynecology

## 2016-10-09 ENCOUNTER — Other Ambulatory Visit: Payer: Self-pay

## 2016-10-09 ENCOUNTER — Encounter (HOSPITAL_COMMUNITY): Payer: Self-pay

## 2016-10-09 DIAGNOSIS — Z01818 Encounter for other preprocedural examination: Secondary | ICD-10-CM | POA: Insufficient documentation

## 2016-10-09 DIAGNOSIS — N939 Abnormal uterine and vaginal bleeding, unspecified: Secondary | ICD-10-CM | POA: Diagnosis not present

## 2016-10-09 HISTORY — DX: Malignant (primary) neoplasm, unspecified: C80.1

## 2016-10-09 HISTORY — DX: Type 2 diabetes mellitus without complications: E11.9

## 2016-10-09 HISTORY — DX: Anxiety disorder, unspecified: F41.9

## 2016-10-09 HISTORY — DX: Unspecified osteoarthritis, unspecified site: M19.90

## 2016-10-09 HISTORY — DX: Peripheral vascular disease, unspecified: I73.9

## 2016-10-09 HISTORY — DX: Disease of blood and blood-forming organs, unspecified: D75.9

## 2016-10-09 LAB — CBC
HEMATOCRIT: 38.5 % (ref 36.0–46.0)
HEMOGLOBIN: 13 g/dL (ref 12.0–15.0)
MCH: 30.5 pg (ref 26.0–34.0)
MCHC: 33.8 g/dL (ref 30.0–36.0)
MCV: 90.4 fL (ref 78.0–100.0)
Platelets: 294 10*3/uL (ref 150–400)
RBC: 4.26 MIL/uL (ref 3.87–5.11)
RDW: 14.2 % (ref 11.5–15.5)
WBC: 8.6 10*3/uL (ref 4.0–10.5)

## 2016-10-09 LAB — TYPE AND SCREEN
ABO/RH(D): O POS
ANTIBODY SCREEN: NEGATIVE

## 2016-10-09 LAB — BASIC METABOLIC PANEL
ANION GAP: 8 (ref 5–15)
BUN: 10 mg/dL (ref 6–20)
CO2: 26 mmol/L (ref 22–32)
Calcium: 9 mg/dL (ref 8.9–10.3)
Chloride: 102 mmol/L (ref 101–111)
Creatinine, Ser: 0.62 mg/dL (ref 0.44–1.00)
GFR calc Af Amer: >60 mL/min (ref >60)
Glucose, Bld: 149 mg/dL — ABNORMAL HIGH (ref 65–99)
POTASSIUM: 4.1 mmol/L (ref 3.5–5.1)
SODIUM: 136 mmol/L (ref 135–145)

## 2016-10-09 LAB — ABO/RH: ABO/RH(D): O POS

## 2016-10-09 NOTE — Patient Instructions (Addendum)
Your procedure is scheduled on: Friday October 17, 2016 at 7:30 am  Enter through the [employer] [facility] at: 6:00 am  Pick up the phone at the desk and dial [phone].  Call this number if you have problems the morning of surgery: [phone].  Remember: Do NOT eat food or drink any liquids after Midnight on Thursday August 9th:  Take these medicines the morning of surgery with a SIP OF WATER: Lopressor(Metoprolol), Cymbalta(DULoxetine)  STOP ALL VITAMINS AND SUPPLEMENTS TODAY  DO NOT SMOKE DAY OF SURGERY  Do NOT wear jewelry (body piercing), metal hair clips/bobby pins, make-up, or nail polish. Do NOT wear lotions, powders, or perfumes.  You may wear deoderant. Do NOT shave for 48 hours prior to surgery. Do NOT bring valuables to the hospital. Contacts, dentures, or bridgework may not be worn into surgery.  Have a responsible adult drive you home and stay with you for 24 hours after your procedure

## 2016-10-16 ENCOUNTER — Encounter (HOSPITAL_COMMUNITY): Payer: Self-pay | Admitting: Anesthesiology

## 2016-10-16 NOTE — H&P (Signed)
Katie Harris is an 41 y.o. female G1P1 who presents for scheduled hysteroscopy, D&C polypectomy and placement of Mirena IUD. Patient has had increasingly abnormal bleeding.  She has had heavy irregular bleeding lasting greater than ten days at time. Heavy with clots and unpredictable.  EMB negative 06/03/16. She has a history of endometrial polyps and had a D&C polypectomy in 2014 which good result until recently.  On SIUS has an area suspicious for a sessile polyp.     Pertinent Gynecological History:  OB History:C-Section x 1 Menstrual History:  No LMP recorded.--irregular    Past Medical History:  Diagnosis Date  . Anxiety   . Arthritis    lower back  . 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 Cancer ([place])    basal cell removed from left hairline area  . Diabetes mellitus without complication ([place])    recent A1C 6.5 no meds presently  . Factor 5 Leiden mutation, heterozygous ([place]) 04/21/2012   Lab 04/16/12 in view of positive Leiden in her father; no personal hx of thrombosis despite prior pregnancy & C-section 2007  . 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 HTN (hypertension), benign 04/21/2012  . Hypertension   . Hyperthyroidism    levels were abnormal in Dec- no treatment currently  . Hyperthyroidism 04/21/2012  . Peripheral vascular disease ([facility])    neuropathy bilateral feet    Past Surgical History:  Procedure Laterality Date  . CESAREAN SECTION    . DILATION AND CURETTAGE OF UTERUS     polpectomy  . TONSILLECTOMY      Family History  Problem Relation Age of Onset  . Heart disease Father   . Diabetes Father   . Hypertension Father   . Hyperlipidemia Father   . Diabetes Maternal Grandmother   . Hyperlipidemia Maternal Grandmother   . Hyperlipidemia Maternal Grandfather   . Hyperlipidemia Paternal Grandmother   . Diabetes Paternal Grandmother   . Cancer Paternal Grandmother   . Heart disease Paternal Grandfather     Social History:   reports that she has never smoked. She has never used smokeless tobacco. She reports that she does not drink alcohol or use drugs.  Allergies: No Known Allergies  No prescriptions prior to admission.    Review of Systems  Cardiovascular: Negative for chest pain.  Gastrointestinal: Negative for abdominal pain.    There were no vitals taken for this visit. Physical Exam  Constitutional:  Obese  Cardiovascular: Normal rate and regular rhythm.   Respiratory: Effort normal.  GI: Soft.  Genitourinary: Vagina normal and uterus normal.  Neurological: She is alert.  Psychiatric: She has a normal mood and affect.    No results found for this or any previous visit (from the past 24 hour(s)).  No results found.  Assessment/Plan: Pt has been counseled on risks and benefits of Hysteroscopy/polypectomy including bleeding and uterine perforation.  She was given option of placing Mirena after D&C to try and keep lining thin and prevent the heavy irregular bleeding and would like to proceed with having that placed as well.  Ready to proceed.  Logan Bores 10/16/2016, 5:39 PM

## 2016-10-16 NOTE — Anesthesia Preprocedure Evaluation (Addendum)
Anesthesia Evaluation  Patient identified by MRN, date of birth, ID band Patient awake    Reviewed: Allergy & Precautions, NPO status , Patient's Chart, lab work & pertinent test results  Airway Mallampati: III  TM Distance: >3 FB Neck ROM: Full    Dental no notable dental hx. (+) Teeth Intact   Pulmonary neg pulmonary ROS,    Pulmonary exam normal breath sounds clear to auscultation       Cardiovascular hypertension, Pt. on medications and Pt. on home beta blockers + Peripheral Vascular Disease  Normal cardiovascular exam Rhythm:Regular Rate:Normal     Neuro/Psych  Headaches, Anxiety Hx/o Pseudotumor cerebri- chronic HA    GI/Hepatic   Endo/Other  diabetes, Well Controlled, Type 2Hyperthyroidism Morbid obesity  Renal/GU   negative genitourinary   Musculoskeletal  (+) Arthritis ,   Abdominal (+) + obese,   Peds  Hematology  (+) Blood dyscrasia, , Factor V Leiden mutation- heterozygous   Anesthesia Other Findings   Reproductive/Obstetrics AUB                            Anesthesia Physical Anesthesia Plan  ASA: III  Anesthesia Plan: General   Post-op Pain Management:    Induction: Intravenous  PONV Risk Score and Plan: 4 or greater and Ondansetron, Dexamethasone, Midazolam, Scopolamine patch - Pre-op and Propofol infusion  Airway Management Planned: LMA  Additional Equipment:   Intra-op Plan: Delibrate Circulatory arrest per surgeon request  Post-operative Plan: Extubation in OR  Informed Consent: I have reviewed the patients History and Physical, chart, labs and discussed the procedure including the risks, benefits and alternatives for the proposed anesthesia with the patient or authorized representative who has indicated his/her understanding and acceptance.   Dental advisory given  Plan Discussed with: CRNA, Anesthesiologist and Surgeon  Anesthesia Plan Comments:         Anesthesia Quick Evaluation

## 2016-10-17 ENCOUNTER — Ambulatory Visit (HOSPITAL_COMMUNITY): Payer: Managed Care, Other (non HMO) | Admitting: Anesthesiology

## 2016-10-17 ENCOUNTER — Encounter (HOSPITAL_COMMUNITY)
Admission: RE | Disposition: A | Discharge status: Home / Self Care | Payer: Self-pay | Source: Ambulatory Visit | Attending: Obstetrics and Gynecology

## 2016-10-17 ENCOUNTER — Encounter (HOSPITAL_COMMUNITY): Payer: Self-pay

## 2016-10-17 ENCOUNTER — Ambulatory Visit (HOSPITAL_COMMUNITY)
Admission: RE | Admit: 2016-10-17 | Discharge: 2016-10-17 | Disposition: A | Discharge status: Home / Self Care | Payer: Managed Care, Other (non HMO) | Source: Ambulatory Visit | Attending: Obstetrics and Gynecology | Admitting: Obstetrics and Gynecology

## 2016-10-17 DIAGNOSIS — N840 Polyp of corpus uteri: Secondary | ICD-10-CM | POA: Diagnosis not present

## 2016-10-17 DIAGNOSIS — I10 Essential (primary) hypertension: Secondary | ICD-10-CM | POA: Diagnosis not present

## 2016-10-17 DIAGNOSIS — Z6841 Body Mass Index (BMI) 40.0 and over, adult: Secondary | ICD-10-CM | POA: Insufficient documentation

## 2016-10-17 DIAGNOSIS — N939 Abnormal uterine and vaginal bleeding, unspecified: Secondary | ICD-10-CM | POA: Diagnosis not present

## 2016-10-17 DIAGNOSIS — Z79899 Other long term (current) drug therapy: Secondary | ICD-10-CM | POA: Diagnosis not present

## 2016-10-17 DIAGNOSIS — Z85828 Personal history of other malignant neoplasm of skin: Secondary | ICD-10-CM | POA: Insufficient documentation

## 2016-10-17 DIAGNOSIS — F419 Anxiety disorder, unspecified: Secondary | ICD-10-CM | POA: Insufficient documentation

## 2016-10-17 DIAGNOSIS — D6851 Activated protein C resistance: Secondary | ICD-10-CM | POA: Diagnosis not present

## 2016-10-17 DIAGNOSIS — E1151 Type 2 diabetes mellitus with diabetic peripheral angiopathy without gangrene: Secondary | ICD-10-CM | POA: Insufficient documentation

## 2016-10-17 HISTORY — PX: DILATATION & CURETTAGE/HYSTEROSCOPY WITH MYOSURE: SHX6511

## 2016-10-17 HISTORY — PX: INTRAUTERINE DEVICE (IUD) INSERTION: SHX5877

## 2016-10-17 LAB — GLUCOSE, CAPILLARY
GLUCOSE-CAPILLARY: 144 mg/dL — AB (ref 65–99)
Glucose-Capillary: 141 mg/dL — ABNORMAL HIGH (ref 65–99)

## 2016-10-17 LAB — PREGNANCY, URINE: PREG TEST UR: NEGATIVE

## 2016-10-17 SURGERY — DILATATION & CURETTAGE/HYSTEROSCOPY WITH MYOSURE
Anesthesia: General

## 2016-10-17 MED ORDER — LIDOCAINE HCL 1 % IJ SOLN
INTRAMUSCULAR | Status: DC | PRN
  Administered 2016-10-17: 20 mL

## 2016-10-17 MED ORDER — MIDAZOLAM HCL 2 MG/2ML IJ SOLN
INTRAMUSCULAR | Status: AC
  Filled 2016-10-17: qty 2

## 2016-10-17 MED ORDER — ONDANSETRON HCL 4 MG/2ML IJ SOLN
INTRAMUSCULAR | Status: DC | PRN
  Administered 2016-10-17: 4 mg via INTRAVENOUS

## 2016-10-17 MED ORDER — MEPERIDINE HCL 25 MG/ML IJ SOLN: 6.2500 mg | INTRAMUSCULAR | Status: DC | PRN

## 2016-10-17 MED ORDER — LACTATED RINGERS IV SOLN
INTRAVENOUS | Status: DC
  Administered 2016-10-17 (×2): via INTRAVENOUS

## 2016-10-17 MED ORDER — SCOPOLAMINE 1 MG/3DAYS TD PT72
1.0000 | MEDICATED_PATCH | Freq: Once | TRANSDERMAL | Status: DC
  Administered 2016-10-17: 1.5 mg via TRANSDERMAL

## 2016-10-17 MED ORDER — KETOROLAC TROMETHAMINE 30 MG/ML IJ SOLN
INTRAMUSCULAR | Status: AC
  Filled 2016-10-17: qty 1

## 2016-10-17 MED ORDER — FENTANYL CITRATE (PF) 100 MCG/2ML IJ SOLN: 25.0000 ug | INTRAMUSCULAR | Status: DC | PRN

## 2016-10-17 MED ORDER — KETOROLAC TROMETHAMINE 30 MG/ML IJ SOLN
INTRAMUSCULAR | Status: DC | PRN
  Administered 2016-10-17: 30 mg via INTRAVENOUS

## 2016-10-17 MED ORDER — DEXAMETHASONE SODIUM PHOSPHATE 4 MG/ML IJ SOLN
INTRAMUSCULAR | Status: DC | PRN
  Administered 2016-10-17: 4 mg via INTRAVENOUS

## 2016-10-17 MED ORDER — LACTATED RINGERS IV SOLN: INTRAVENOUS | Status: DC

## 2016-10-17 MED ORDER — LIDOCAINE HCL (CARDIAC) 20 MG/ML IV SOLN
INTRAVENOUS | Status: AC
  Filled 2016-10-17: qty 5

## 2016-10-17 MED ORDER — SODIUM CHLORIDE 0.9 % IR SOLN
Status: DC | PRN
  Administered 2016-10-17: 3000 mL

## 2016-10-17 MED ORDER — MIDAZOLAM HCL 2 MG/2ML IJ SOLN
INTRAMUSCULAR | Status: DC | PRN
  Administered 2016-10-17: 2 mg via INTRAVENOUS

## 2016-10-17 MED ORDER — SCOPOLAMINE 1 MG/3DAYS TD PT72
MEDICATED_PATCH | TRANSDERMAL | Status: AC
  Administered 2016-10-17: 1.5 mg via TRANSDERMAL
  Filled 2016-10-17: qty 1

## 2016-10-17 MED ORDER — PROPOFOL 10 MG/ML IV BOLUS
INTRAVENOUS | Status: AC
  Filled 2016-10-17: qty 20

## 2016-10-17 MED ORDER — DEXAMETHASONE SODIUM PHOSPHATE 4 MG/ML IJ SOLN
INTRAMUSCULAR | Status: AC
  Filled 2016-10-17: qty 1

## 2016-10-17 MED ORDER — METOCLOPRAMIDE HCL 5 MG/ML IJ SOLN: 10.0000 mg | Freq: Once | INTRAMUSCULAR | Status: DC | PRN

## 2016-10-17 MED ORDER — LIDOCAINE HCL (CARDIAC) 20 MG/ML IV SOLN
INTRAVENOUS | Status: DC | PRN
  Administered 2016-10-17: 100 mg via INTRAVENOUS

## 2016-10-17 MED ORDER — PROPOFOL 10 MG/ML IV BOLUS
INTRAVENOUS | Status: DC | PRN
  Administered 2016-10-17: 280 mg via INTRAVENOUS

## 2016-10-17 MED ORDER — FENTANYL CITRATE (PF) 100 MCG/2ML IJ SOLN
INTRAMUSCULAR | Status: AC
  Filled 2016-10-17: qty 2

## 2016-10-17 MED ORDER — LIDOCAINE HCL 1 % IJ SOLN
INTRAMUSCULAR | Status: AC
  Filled 2016-10-17: qty 20

## 2016-10-17 MED ORDER — ONDANSETRON HCL 4 MG/2ML IJ SOLN
INTRAMUSCULAR | Status: AC
  Filled 2016-10-17: qty 2

## 2016-10-17 MED ORDER — FENTANYL CITRATE (PF) 100 MCG/2ML IJ SOLN
INTRAMUSCULAR | Status: DC | PRN
  Administered 2016-10-17: 100 ug via INTRAVENOUS

## 2016-10-17 MED ORDER — HYDROCODONE-ACETAMINOPHEN 7.5-325 MG PO TABS: 1.0000 | ORAL_TABLET | Freq: Once | ORAL | Status: DC | PRN

## 2016-10-17 SURGICAL SUPPLY — 18 items
CANISTER SUCT 3000ML PPV (MISCELLANEOUS) ×3 IMPLANT
CATH ROBINSON RED A/P 16FR (CATHETERS) ×3 IMPLANT
CLOTH BEACON ORANGE TIMEOUT ST (SAFETY) ×3 IMPLANT
CONTAINER PREFILL 10% NBF 60ML (FORM) ×6 IMPLANT
DEVICE MYOSURE LITE (MISCELLANEOUS) ×3 IMPLANT
DEVICE MYOSURE REACH (MISCELLANEOUS) IMPLANT
FILTER ARTHROSCOPY CONVERTOR (FILTER) ×3 IMPLANT
GLOVE BIO SURGEON STRL SZ 6.5 (GLOVE) ×2 IMPLANT
GLOVE BIO SURGEONS STRL SZ 6.5 (GLOVE) ×1
GLOVE BIOGEL PI IND STRL 7.0 (GLOVE) ×1 IMPLANT
GLOVE BIOGEL PI INDICATOR 7.0 (GLOVE) ×2
GOWN STRL REUS W/TWL LRG LVL3 (GOWN DISPOSABLE) ×6 IMPLANT
PACK VAGINAL MINOR WOMEN LF (CUSTOM PROCEDURE TRAY) ×3 IMPLANT
PAD OB MATERNITY 4.3X12.25 (PERSONAL CARE ITEMS) ×3 IMPLANT
SEAL ROD LENS SCOPE MYOSURE (ABLATOR) ×3 IMPLANT
TOWEL OR 17X24 6PK STRL BLUE (TOWEL DISPOSABLE) ×6 IMPLANT
TUBING AQUILEX INFLOW (TUBING) ×3 IMPLANT
TUBING AQUILEX OUTFLOW (TUBING) ×3 IMPLANT

## 2016-10-17 NOTE — Progress Notes (Signed)
Patient ID: Katie Harris, female   DOB: [birth year], 41 y.o.   MRN: [id number] Per pt no changes in dictated H&P.  Brief exam WNL.  Ready to proceed.

## 2016-10-17 NOTE — Anesthesia Procedure Notes (Signed)
Procedure Name: LMA Insertion Date/Time: 10/17/2016 7:26 AM Performed by: Raenette Rover Pre-anesthesia Checklist: Patient identified, Emergency Drugs available, Suction available and Patient being monitored Patient Re-evaluated:Patient Re-evaluated prior to induction Oxygen Delivery Method: Circle system utilized Preoxygenation: Pre-oxygenation with 100% oxygen Induction Type: IV induction Ventilation: Mask ventilation without difficulty LMA: LMA with gastric port inserted LMA Size: 4.0 Number of attempts: 1 Placement Confirmation: positive ETCO2,  CO2 detector and breath sounds checked- equal and bilateral Tube secured with: Tape Dental Injury: Teeth and Oropharynx as per pre-operative assessment

## 2016-10-17 NOTE — Transfer of Care (Signed)
Immediate Anesthesia Transfer of Care Note  Patient: Katie Harris  Procedure(s) Performed: Procedure(s) with comments: DILATATION & CURETTAGE/HYSTEROSCOPY WITH MYOSURE POLYPECTOMY (N/A) - myosure rep will be here confirmed on 10/06/16 INTRAUTERINE DEVICE (IUD) INSERTION MIRENA (N/A)  Patient Location: PACU  Anesthesia Type:General  Level of Consciousness: awake, alert , oriented and patient cooperative  Airway & Oxygen Therapy: Patient Spontanous Breathing and Patient connected to nasal cannula oxygen  Post-op Assessment: Report given to RN and Post -op Vital signs reviewed and stable  Post vital signs: Reviewed and stable  Last Vitals:  Vitals:   10/17/16 0618  BP: (!) 182/95  Pulse: 95  Resp: (!) 22  Temp: 37.2 C  SpO2: 100%    Last Pain:  Vitals:   10/17/16 0618  TempSrc: Oral      Patients Stated Pain Goal: 3 (03/70/96 4383)  Complications: No apparent anesthesia complications

## 2016-10-17 NOTE — Op Note (Signed)
Operative Note    Preoperative Diagnosis Abnormal uterine bleeding Endometrial polyp  Postoperative Diagnosis same  Procedure Hysteroscopy with Myosure resection of endometrial polyp and curretage Insertion of Mirena IUD  Surgeon Paula Compton, MD  Anesthesia LMA  Fluids: EBL 33mL UOP 71mL IVF 86mL Hysteroscopic deficit 156mL  Findings Normal uterine cavity except a sessile polyp on posterior wall, 1.5cm  Specimen Endometrial polyp and currettings  Procedure Note Patient was taken to the operating room where LMA anesthesia was obtained without difficulty. She was then prepped and draped in the normal sterile fashion in the dorsal lithotomy position. An appropriate time out was performed. A speculum was then placed within the vagina and the anterior lip of the cervix identified and injected with approximately 2 cc of 1% plain lidocaine. An additional 9 cc each was placed at 2 and 10:00 for a paracervical block. Uterus was then sounded to approximately 12 cm and the Pratt dilators utilized to dilate the cervix up to approximately 24. The Myosure operating scope was then introduced into the cervix and the cavity inspected with findings as previously noted. The Myosure lite operating blade was then introduced through the scope and under direct visualization the polyp was removed in entirety. There was no active bleeding at the conclusion of the removal. The operating scope was then removed from the cervix a small curette inserted into the uterine fundus. A gentle curettage was performed in all 4 quadrants to sample any remaining endometrium. All tissue specimens were handed off to pathology.  THe Mirena IUD was then easily introduced and deployed into the fundus, strings trimmed to 2cm.   The tenaculum was then removed from the cervix and the site rendered hemostatic with silver nitrate. Finally the speculum was removed from the vagina and the patient awakened and taken to the  recovery room in good condition.

## 2016-10-17 NOTE — OR Nursing (Signed)
MIRENA IUD PLACED BY DR. Marvel Plan.  GTIN [id number] S/N [id number] EXP NOV 2020 LOT [id number]  BROUGHT BY DR. Marvel Plan FROM HER OFFICE. IMPLANTED AT 0745.

## 2016-10-17 NOTE — Discharge Instructions (Signed)
No IBUPROFEN BEFORE 2 PM TODAY Post Anesthesia Home Care Instructions  Activity: Get plenty of rest for the remainder of the day. A responsible individual must stay with you for 24 hours following the procedure.  For the next 24 hours, DO NOT: -Drive a car -Paediatric nurse -Drink alcoholic beverages -Take any medication unless instructed by your physician -Make any legal decisions or sign important papers.  Meals: Start with liquid foods such as gelatin or soup. Progress to regular foods as tolerated. Avoid greasy, spicy, heavy foods. If nausea and/or vomiting occur, drink only clear liquids until the nausea and/or vomiting subsides. Call your physician if vomiting continues.  Special Instructions/Symptoms: Your throat may feel dry or sore from the anesthesia or the breathing tube placed in your throat during surgery. If this causes discomfort, gargle with warm salt water. The discomfort should disappear within 24 hours.  If you had a scopolamine patch placed behind your ear for the management of post- operative nausea and/or vomiting:  1. The medication in the patch is effective for 72 hours, after which it should be removed.  Wrap patch in a tissue and discard in the trash. Wash hands thoroughly with soap and water. 2. You may remove the patch earlier than 72 hours if you experience unpleasant side effects which may include dry mouth, dizziness or visual disturbances. 3. Avoid touching the patch. Wash your hands with soap and water after contact with the patch.   DISCHARGE INSTRUCTIONS: D&C / D&E The following instructions have been prepared to help you care for yourself upon your return home.   Personal hygiene:  Use sanitary pads for vaginal drainage, not tampons.  Shower the day after your procedure.  NO tub baths, pools or Jacuzzis for 2-3 weeks.  Wipe front to back after using the bathroom.  Activity and limitations:  Do NOT drive or operate any equipment for 24  hours. The effects of anesthesia are still present and drowsiness may result.  Do NOT rest in bed all day.  Walking is encouraged.  Walk up and down stairs slowly.  You may resume your normal activity in one to two days or as indicated by your physician.  Sexual activity: NO intercourse for at least 2 weeks after the procedure, or as indicated by your physician.  Diet: Eat a light meal as desired this evening. You may resume your usual diet tomorrow.  Return to work: You may resume your work activities in one to two days or as indicated by your doctor.  What to expect after your surgery: Expect to have vaginal bleeding/discharge for 2-3 days and spotting for up to 10 days. It is not unusual to have soreness for up to 1-2 weeks. You may have a slight burning sensation when you urinate for the first day. Mild cramps may continue for a couple of days. You may have a regular period in 2-6 weeks.  Call your doctor for any of the following:  Excessive vaginal bleeding, saturating and changing one pad every hour.  Inability to urinate 6 hours after discharge from hospital.  Pain not relieved by pain medication.  Fever of 100.4 F or greater.  Unusual vaginal discharge or odor.   Call for an appointment:    Patients signature: ______________________  Nurses signature ________________________  Support person's signature_______________________

## 2016-10-17 NOTE — Anesthesia Postprocedure Evaluation (Signed)
Anesthesia Post Note  Patient: Katie Harris  Procedure(s) Performed: Procedure(s) (LRB): DILATATION & CURETTAGE/HYSTEROSCOPY WITH MYOSURE POLYPECTOMY (N/A) INTRAUTERINE DEVICE (IUD) INSERTION MIRENA (N/A)     Patient location during evaluation: PACU Anesthesia Type: General Level of consciousness: awake and alert and oriented Pain management: pain level controlled Vital Signs Assessment: post-procedure vital signs reviewed and stable Respiratory status: spontaneous breathing, nonlabored ventilation and respiratory function stable Cardiovascular status: blood pressure returned to baseline and stable Postop Assessment: no signs of nausea or vomiting Anesthetic complications: no    Last Vitals:  Vitals:   10/17/16 0830 10/17/16 0900  BP: (!) 145/86 (!) 146/78  Pulse: 65 64  Resp: 16 17  Temp:    SpO2: 96% 98%    Last Pain:  Vitals:   10/17/16 0618  TempSrc: Oral   Pain Goal: Patients Stated Pain Goal: 3 (10/17/16 0900)               Henryk Ursin A.

## 2016-10-18 ENCOUNTER — Encounter (HOSPITAL_COMMUNITY): Payer: Self-pay | Admitting: Obstetrics and Gynecology

## 2016-11-28 ENCOUNTER — Ambulatory Visit: Payer: Managed Care, Other (non HMO) | Admitting: Internal Medicine

## 2016-12-02 ENCOUNTER — Telehealth: Payer: Self-pay

## 2016-12-02 NOTE — Telephone Encounter (Signed)
I called pt, per Dr. Jaynee Eagles request, to discuss if pt would be amenable to seeing Dr. Leta Baptist on 12/05/16 at 8:00am instead of Dr. Jaynee Eagles on 12/04/16 at 3:30pm. Pt cannot make the 8:00am appt on Friday and asked to keep the 12/04/16 appt as scheduled.

## 2016-12-04 ENCOUNTER — Ambulatory Visit (INDEPENDENT_AMBULATORY_CARE_PROVIDER_SITE_OTHER): Payer: Managed Care, Other (non HMO) | Admitting: Neurology

## 2016-12-04 ENCOUNTER — Encounter: Payer: Self-pay | Admitting: Neurology

## 2016-12-04 VITALS — BP 172/100 | HR 82 | Ht 69.0 in | Wt 378.8 lb

## 2016-12-04 DIAGNOSIS — E531 Pyridoxine deficiency: Secondary | ICD-10-CM

## 2016-12-04 DIAGNOSIS — E538 Deficiency of other specified B group vitamins: Secondary | ICD-10-CM | POA: Diagnosis not present

## 2016-12-04 DIAGNOSIS — R0683 Snoring: Secondary | ICD-10-CM

## 2016-12-04 DIAGNOSIS — E1140 Type 2 diabetes mellitus with diabetic neuropathy, unspecified: Secondary | ICD-10-CM | POA: Insufficient documentation

## 2016-12-04 DIAGNOSIS — R51 Headache with orthostatic component, not elsewhere classified: Secondary | ICD-10-CM

## 2016-12-04 DIAGNOSIS — H05119 Granuloma of unspecified orbit: Secondary | ICD-10-CM | POA: Diagnosis not present

## 2016-12-04 DIAGNOSIS — G8929 Other chronic pain: Secondary | ICD-10-CM

## 2016-12-04 DIAGNOSIS — G08 Intracranial and intraspinal phlebitis and thrombophlebitis: Secondary | ICD-10-CM

## 2016-12-04 DIAGNOSIS — H4710 Unspecified papilledema: Secondary | ICD-10-CM

## 2016-12-04 DIAGNOSIS — H5713 Ocular pain, bilateral: Secondary | ICD-10-CM | POA: Diagnosis not present

## 2016-12-04 DIAGNOSIS — G629 Polyneuropathy, unspecified: Secondary | ICD-10-CM

## 2016-12-04 DIAGNOSIS — G932 Benign intracranial hypertension: Secondary | ICD-10-CM

## 2016-12-04 DIAGNOSIS — H547 Unspecified visual loss: Secondary | ICD-10-CM

## 2016-12-04 DIAGNOSIS — R519 Headache, unspecified: Secondary | ICD-10-CM

## 2016-12-04 DIAGNOSIS — E519 Thiamine deficiency, unspecified: Secondary | ICD-10-CM | POA: Diagnosis not present

## 2016-12-04 DIAGNOSIS — G609 Hereditary and idiopathic neuropathy, unspecified: Secondary | ICD-10-CM

## 2016-12-04 NOTE — Patient Instructions (Signed)
Ophthalmology referral Dr. Katy Fitch MRI of the brain and orbits Sleep evaluation for sleep apnea [facility]  Labwork     Idiopathic Intracranial Hypertension Idiopathic intracranial hypertension (IIH) is a condition that increases pressure around the brain. The fluid that surrounds the brain and spinal cord (cerebrospinal fluid, CSF) increases and causes the pressure. Idiopathic means that the cause of this condition is not known. IIH affects the brain and spinal cord (is a neurological disorder). If this condition is not treated, it can cause vision loss or blindness. What increases the risk? You are more likely to develop this condition if:  You are severely overweight (obese).  You are a woman who has not gone through menopause.  You take certain medicines, such as birth control or steroids.  What are the signs or symptoms? Symptoms of IIH include:  Headaches. This is the most common symptom.  Pain in the shoulders or neck.  Nausea and vomiting.  A "rushing water" or pulsing sound within the ears (pulsatile tinnitus).  Double vision.  Blurred vision.  Brief episodes of complete vision loss.  How is this diagnosed? This condition may be diagnosed based on:  Your symptoms.  Your medical history.  CT scan of the brain.  MRI of the brain.  Magnetic resonance venogram (MRV) to check veins in the brain.  Diagnostic lumbar puncture. This is a procedure to remove and examine a sample of cerebrospinal fluid. This procedure can determine whether too much fluid may be causing IIH.  A thorough eye exam to check for swelling or nerve damage in the eyes.  How is this treated? Treatment for this condition depends on your symptoms. The goal of treatment is to decrease the pressure around your brain. Common treatments include:  Medicines to decrease the production of spinal fluid and lower the pressure within your skull.  Medicines to prevent or treat  headaches.  Surgery to place drains (shunts) in your brain to remove excess fluid.  Lumbar puncture to remove excess cerebrospinal fluid.  Follow these instructions at home:  If you are overweight or obese, work with your health care provider to lose weight.  Take over-the-counter and prescription medicines only as told by your health care provider.  Do not drive or use heavy machinery while taking medicines that can make you sleepy.  Keep all follow-up visits as told by your health care provider. This is important. Contact a health care provider if:  You have changes in your vision, such as: ? Double vision. ? Not being able to see colors (color vision). Get help right away if:  You have any of the following symptoms and they get worse or do not get better. ? Headaches. ? Nausea. ? Vomiting. ? Vision changes or difficulty seeing. Summary  Idiopathic intracranial hypertension (IIH) is a condition that increases pressure around the brain. The cause is not known (is idiopathic).  The most common symptom of IIH is headaches.  Treatment may include medicines or surgery to relieve the pressure on your brain. This information is not intended to replace advice given to you by your health care provider. Make sure you discuss any questions you have with your health care provider. Document Released: 05/05/2001 Document Revised: 01/16/2016 Document Reviewed: 01/16/2016 Elsevier Interactive Patient Education  2017 [employer].   Migraine Headache A migraine headache is an intense, throbbing pain on one side or both sides of the head. Migraines may also cause other symptoms, such as nausea, vomiting, and sensitivity  to light and noise. What are the causes? Doing or taking certain things may also trigger migraines, such as:  Alcohol.  Smoking.  Medicines, such as: ? Medicine used to treat chest pain (nitroglycerine). ? Birth control pills. ? Estrogen pills. ? Certain blood  pressure medicines.  Aged cheeses, chocolate, or caffeine.  Foods or drinks that contain nitrates, glutamate, aspartame, or tyramine.  Physical activity.  Other things that may trigger a migraine include:  Menstruation.  Pregnancy.  Hunger.  Stress, lack of sleep, too much sleep, or fatigue.  Weather changes.  What increases the risk? The following factors may make you more likely to experience migraine headaches:  Age. Risk increases with age.  Family history of migraine headaches.  Being Caucasian.  Depression and anxiety.  Obesity.  Being a woman.  Having a hole in the heart (patent foramen ovale) or other heart problems.  What are the signs or symptoms? The main symptom of this condition is pulsating or throbbing pain. Pain may:  Happen in any area of the head, such as on one side or both sides.  Interfere with daily activities.  Get worse with physical activity.  Get worse with exposure to bright lights or loud noises.  Other symptoms may include:  Nausea.  Vomiting.  Dizziness.  General sensitivity to bright lights, loud noises, or smells.  Before you get a migraine, you may get warning signs that a migraine is developing (aura). An aura may include:  Seeing flashing lights or having blind spots.  Seeing bright spots, halos, or zigzag lines.  Having tunnel vision or blurred vision.  Having numbness or a tingling feeling.  Having trouble talking.  Having muscle weakness.  How is this diagnosed? A migraine headache can be diagnosed based on:  Your symptoms.  A physical exam.  Tests, such as CT scan or MRI of the head. These imaging tests can help rule out other causes of headaches.  Taking fluid from the spine (lumbar puncture) and analyzing it (cerebrospinal fluid analysis, or CSF analysis).  How is this treated? A migraine headache is usually treated with medicines that:  Relieve pain.  Relieve nausea.  Prevent migraines  from coming back.  Treatment may also include:  Acupuncture.  Lifestyle changes like avoiding foods that trigger migraines.  Follow these instructions at home: Medicines  Take over-the-counter and prescription medicines only as told by your health care provider.  Do not drive or use heavy machinery while taking prescription pain medicine.  To prevent or treat constipation while you are taking prescription pain medicine, your health care provider may recommend that you: ? Drink enough fluid to keep your urine clear or pale yellow. ? Take over-the-counter or prescription medicines. ? Eat foods that are high in fiber, such as fresh fruits and vegetables, whole grains, and beans. ? Limit foods that are high in fat and processed sugars, such as fried and sweet foods. Lifestyle  Avoid alcohol use.  Do not use any products that contain nicotine or tobacco, such as cigarettes and e-cigarettes. If you need help quitting, ask your health care provider.  Get at least 8 hours of sleep every night.  Limit your stress. General instructions   Keep a journal to find out what may trigger your migraine headaches. For example, write down: ? What you eat and drink. ? How much sleep you get. ? Any change to your diet or medicines.  If you have a migraine: ? Avoid things that make your symptoms worse, such  as bright lights. ? It may help to lie down in a dark, quiet room. ? Do not drive or use heavy machinery. ? Ask your health care provider what activities are safe for you while you are experiencing symptoms.  Keep all follow-up visits as told by your health care provider. This is important. Contact a health care provider if:  You develop symptoms that are different or more severe than your usual migraine symptoms. Get help right away if:  Your migraine becomes severe.  You have a fever.  You have a stiff neck.  You have vision loss.  Your muscles feel weak or like you cannot  control them.  You start to lose your balance often.  You develop trouble walking.  You faint. This information is not intended to replace advice given to you by your health care provider. Make sure you discuss any questions you have with your health care provider. Document Released: 02/24/2005 Document Revised: 09/14/2015 Document Reviewed: 08/13/2015 Elsevier Interactive Patient Education  2017 [employer]    Peripheral Neuropathy Peripheral neuropathy is a type of nerve damage. It affects nerves that carry signals between the spinal cord and other parts of the body. These are called peripheral nerves. With peripheral neuropathy, one nerve or a group of nerves may be damaged. What are the causes? Many things can damage peripheral nerves. For some people with peripheral neuropathy, the cause is unknown. Some causes include:  Diabetes. This is the most common cause of peripheral neuropathy.  Injury to a nerve.  Pressure or stress on a nerve that lasts a long time.  Too little vitamin B. Alcoholism can lead to this.  Infections.  Autoimmune diseases, such as multiple sclerosis and systemic lupus erythematosus.  Inherited nerve diseases.  Some medicines, such as cancer drugs.  Toxic substances, such as lead and mercury.  Too little blood flowing to the legs.  Kidney disease.  Thyroid disease.  What are the signs or symptoms? Different people have different symptoms. The symptoms you have will depend on which of your nerves is damaged. Common symptoms include:  Loss of feeling (numbness) in the feet and hands.  Tingling in the feet and hands.  Pain that burns.  Very sensitive skin.  Weakness.  Not being able to move a part of the body (paralysis).  Muscle twitching.  Clumsiness or poor coordination.  Loss of balance.  Not being able to control your bladder.  Feeling dizzy.  Sexual problems.  How is this diagnosed? Peripheral neuropathy is a  symptom, not a disease. Finding the cause of peripheral neuropathy can be hard. To figure that out, your health care provider will take a medical history and do a physical exam. A neurological exam will also be done. This involves checking things affected by your brain, spinal cord, and nerves (nervous system). For example, your health care provider will check your reflexes, how you move, and what you can feel. Other types of tests may also be ordered, such as:  Blood tests.  A test of the fluid in your spinal cord.  Imaging tests, such as CT scans or an MRI.  Electromyography (EMG). This test checks the nerves that control muscles.  Nerve conduction velocity tests. These tests check how fast messages pass through your nerves.  Nerve biopsy. A small piece of nerve is removed. It is then checked under a microscope.  How is this treated?  Medicine is often used to treat peripheral neuropathy. Medicines may include: ? Pain-relieving medicines. Prescription  or over-the-counter medicine may be suggested. ? Antiseizure medicine. This may be used for pain. ? Antidepressants. These also may help ease pain from neuropathy. ? Lidocaine. This is a numbing medicine. You might wear a patch or be given a shot. ? Mexiletine. This medicine is typically used to help control irregular heart rhythms.  Surgery. Surgery may be needed to relieve pressure on a nerve or to destroy a nerve that is causing pain.  Physical therapy to help movement.  Assistive devices to help movement. Follow these instructions at home:  Only take over-the-counter or prescription medicines as directed by your health care provider. Follow the instructions carefully for any given medicines. Do not take any other medicines without first getting approval from your health care provider.  If you have diabetes, work closely with your health care provider to keep your blood sugar under control.  If you have numbness in your  feet: ? Check every day for signs of injury or infection. Watch for redness, warmth, and swelling. ? Wear padded socks and comfortable shoes. These help protect your feet.  Do not do things that put pressure on your damaged nerve.  Do not smoke. Smoking keeps blood from getting to damaged nerves.  Avoid or limit alcohol. Too much alcohol can cause a lack of B vitamins. These vitamins are needed for healthy nerves.  Develop a good support system. Coping with peripheral neuropathy can be stressful. Talk to a mental health specialist or join a support group if you are struggling.  Follow up with your health care provider as directed. Contact a health care provider if:  You have new signs or symptoms of peripheral neuropathy.  You are struggling emotionally from dealing with peripheral neuropathy.  You have a fever. Get help right away if:  You have an injury or infection that is not healing.  You feel very dizzy or begin vomiting.  You have chest pain.  You have trouble breathing. This information is not intended to replace advice given to you by your health care provider. Make sure you discuss any questions you have with your health care provider. Document Released: 02/14/2002 Document Revised: 08/02/2015 Document Reviewed: 11/01/2012 Elsevier Interactive Patient Education  2017 [employer].

## 2016-12-04 NOTE — Progress Notes (Signed)
[facility]    Provider:  Dr Jaynee Eagles Referring Provider: Aretta Nip, MD Primary Care Physician:  Aretta Nip, MD  CC:  Pseudotumor cerebri and peripheral neuropathy  HPI:  Katie Harris is a 41 y.o. female here as a referral from Dr. Radene Ou for peripheral neuropathy and idiopathic intracranial hypertension. Past medical history of high blood pressure, benign intracranial hypertension, thyrotoxicosis. Was diagnosed with IIH in 1994. She was treated by the [facility] by Dr. Earley Favor. She has headaches, every day continuously. She has seen multiple neurologists, Dr. Earley Favor and Dr. Melton Alar. She says no one can help her. She has had back pain since her last LP. She declines LPs. Headache has been stable for years has not changed wince 1994. She has pressure behind the eyes and in the temples. She has light and sound sensitivity. She doesn't remember what she took and nothing helped. She can't remember. She wakes up with headaches. She saw ophthalmology, neuropathy in the feet started in the balls of her feet, started 6-7 years ago, progressive, hgba1c at the time was prediabetic. No other focal neurologic deficits, associated symptoms, inciting events or modifiable factors.  Reviewed notes, labs and imaging from outside physicians, which showed:  Patient was seen by Dr. Maxwell Caul and had early stages neuropathy years ago with fever numbs and hands are beginning to become numb. Hydrocodone. At appointment only her right thumb was numb. He was diagnosed with pseudotumor cerebri but does not take medications. Has a history of thyrotoxicosis. Takes duloxetine and Flexeril for pain.  B12 is 938. Hemoglobin A1c 6.5.  Review of Systems: Patient complains of symptoms per HPI as well as the following symptoms: headache. Pertinent negatives and positives per HPI. All others negative.   Social History   Social History  . Marital status: Married    Spouse  name: N/A  . Number of children: N/A  . Years of education: N/A   Occupational History  . Not on file.   Social History Main Topics  . Smoking status: Never Smoker  . Smokeless tobacco: Never Used  . Alcohol use No  . Drug use: No  . Sexual activity: Yes    Birth control/ protection: None   Other Topics Concern  . Not on file   Social History Narrative  . No narrative on file    Family History  Problem Relation Age of Onset  . Heart disease Father   . Diabetes Father   . Hypertension Father   . Hyperlipidemia Father   . Diabetes Maternal Grandmother   . Hyperlipidemia Maternal Grandmother   . Hyperlipidemia Maternal Grandfather   . Hyperlipidemia Paternal Grandmother   . Diabetes Paternal Grandmother   . Cancer Paternal Grandmother   . Heart disease Paternal Grandfather     Past Medical History:  Diagnosis Date  . Anxiety   . Arthritis    lower back  . 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 Cancer ([place])    basal cell removed from left hairline area  . Diabetes mellitus without complication ([place])    recent A1C 6.5 no meds presently  . Factor 5 Leiden mutation, heterozygous ([place]) 04/21/2012   Lab 04/16/12 in view of positive Leiden in her father; no personal hx of thrombosis despite prior pregnancy & C-section 2007  . 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 HTN (hypertension), benign 04/21/2012  . Hypertension   . Hyperthyroidism  levels were abnormal in Dec- no treatment currently  . Hyperthyroidism 04/21/2012  . Peripheral vascular disease (H[facility])    neuropathy bilateral feet    Past Surgical History:  Procedure Laterality Date  . CESAREAN SECTION    . DILATATION & CURETTAGE/HYSTEROSCOPY WITH MYOSURE N/A 10/17/2016   Procedure: DILATATION & CURETTAGE/HYSTEROSCOPY WITH MYOSURE POLYPECTOMY;  Surgeon: Paula Compton, MD;  Location: [facility];  Service: Gynecology;  Laterality: N/A;  myosure rep will be here confirmed on 10/06/16  .  DILATION AND CURETTAGE OF UTERUS     polpectomy  . INTRAUTERINE DEVICE (IUD) INSERTION N/A 10/17/2016   Procedure: INTRAUTERINE DEVICE (IUD) INSERTION MIRENA;  Surgeon: Paula Compton, MD;  Location: [facility];  Service: Gynecology;  Laterality: N/A;  . TONSILLECTOMY      Current Outpatient Prescriptions  Medication Sig Dispense Refill  . acetaminophen (TYLENOL) 325 MG tablet Take 650 mg by mouth every 6 (six) hours as needed (for pain.).    Marland Kitchen cyclobenzaprine (FLEXERIL) 10 MG tablet Take 1 tablet (10 mg total) by mouth 3 (three) times daily as needed for muscle spasms. (Patient taking differently: Take 10 mg by mouth at bedtime. ) 60 tablet 0  . DULoxetine (CYMBALTA) 60 MG capsule Take 60 mg by mouth daily.   6  . metoprolol (LOPRESSOR) 50 MG tablet Take 50 mg by mouth 2 (two) times daily.     . Multiple Vitamin (MULTIVITAMIN WITH MINERALS) TABS tablet Take 1 tablet by mouth daily. (Alive Adult Multivitamin)     No current facility-administered medications for this visit.     Allergies as of 12/04/2016  . (No Known Allergies)    Vitals: BP (!) 172/100   Pulse 82   Ht 5\' 9"  (1.753 m)   Wt (!) 378 lb 12.8 oz (171.8 kg)   BMI 55.94 kg/m  Last Weight:  Wt Readings from Last 1 Encounters:  12/04/16 (!) 378 lb 12.8 oz (171.8 kg)   Last Height:   Ht Readings from Last 1 Encounters:  12/04/16 5\' 9"  (1.753 m)    Physical exam: Exam: Gen: NAD, conversant, well nourised, morbidly obese            CV: RRR, no MRG. No Carotid Bruits. No peripheral edema, warm, nontender Eyes: Conjunctivae clear without exudates or hemorrhage  Neuro: Detailed Neurologic Exam  Speech:    Speech is normal; fluent and spontaneous with normal comprehension.  Cognition:    The patient is oriented to person, place, and time;     recent and remote memory intact;     language fluent;     normal attention, concentration,     fund of knowledge Cranial Nerves:    The pupils are equal, round, and  reactive to light. Blurred margins, ONH edema?. Visual fields are full to finger confrontation. Extraocular movements are intact. Trigeminal sensation is intact and the muscles of mastication are normal. The face is symmetric. The palate elevates in the midline. Hearing intact. Voice is normal. Shoulder shrug is normal. The tongue has normal motion without fasciculations.   Coordination:    No dysmetria  Gait:    Wide based due to extremely large body habitus  Motor Observation:    No asymmetry, no atrophy, and no involuntary movements noted. Tone:    Normal muscle tone.    Posture:    Posture is normal. normal erect    Strength:    Strength is V/V in the upper and lower limbs.      Sensation: Decreased pinprick  and temperature distally in the lower extremities in a gradient fashion, intact proprioception and vibration     Reflex Exam:  DTR's:    Deep tendon reflexes in the upper and lower extremities are normal bilaterally.   Toes:    The toes are downgoing bilaterally.   Clonus:    Clonus is absent.      Assessment/Plan:  This is a morbidly obese female with a past medical history of idiopathic peripheral polyneuropathy and Idiopathic Intracranial HTN (IIH) that has been untreated for multiple years.   - Ophthalmology referral to evaluate visual fields and a fully dilated exam due to long-standing untreated IIH - Sleep study due to morbid obesity(BMI 55), continuous chronic headache, snoring, morning headache - Discussed medication overuse and not to take any types of analgesic medications or over-the-counter acute headache medications more than 10 times in a month. - For her Morbid obesity. Healthy weight and wellness center. BMI 55 - Idiopathic peripheral neuropathy: Progressive, risk factors include morbid obesity, PVD, DM, thyroid disease. Needs a thorough evaluation. 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 - Given visual changes, positional headache, need  MRI of the brain with and without contrast, also MRI of the orbits to evaluate for orbital pseudotumor given eye pain and MRV to evaluate for cerebral thrombosis given obesity and optic nerve edema.  Discussed: To prevent or relieve headaches, try the following: Cool Compress. Lie down and place a cool compress on your head.  Avoid headache triggers. If certain foods or odors seem to have triggered your migraines in the past, avoid them. A headache diary might help you identify triggers.  Include physical activity in your daily routine. Try a daily walk or other moderate aerobic exercise.  Manage stress. Find healthy ways to cope with the stressors, such as delegating tasks on your to-do list.  Practice relaxation techniques. Try deep breathing, yoga, massage and visualization.  Eat regularly. Eating regularly scheduled meals and maintaining a healthy diet might help prevent headaches. Also, drink plenty of fluids.  Follow a regular sleep schedule. Sleep deprivation might contribute to headaches Consider biofeedback. With this mind-body technique, you learn to control certain bodily functions - such as muscle tension, heart rate and blood pressure - to prevent headaches or reduce headache pain.    Proceed to emergency room if you experience new or worsening symptoms or symptoms do not resolve, if you have new neurologic symptoms or if headache is severe, or for any concerning symptom.   Orders Placed This Encounter  Procedures  . MR BRAIN W WO CONTRAST  . MR ORBITS W WO CONTRAST  . MR MRV HEAD WO CM  . Angiotensin converting enzyme  . HIV antibody  . Sedimentation rate  . ANA w/Reflex  . Sjogren's syndrome antibods(ssa + ssb)  . Pan-ANCA  . TSH  . B12 and Folate Panel  . RPR  . Hepatitis C antibody  . Tissue transglutaminase, IgA  . Gliadin antibodies, serum  . Rheumatoid factor  . Heavy metals, blood  . Vitamin B6  . Basic Metabolic Panel  . B. burgdorfi Antibody  .  Methylmalonic acid, serum  . Ambulatory referral to Ophthalmology  . Ambulatory referral to Sleep Studies  . Ambulatory referral to Family Practice     Cc: Dr. Tawny Asal, MD  [facility] [address] [place], [place] [address]  Phone [phone] Fax [phone]

## 2017-01-01 ENCOUNTER — Ambulatory Visit
Admission: RE | Admit: 2017-01-01 | Discharge: 2017-01-01 | Disposition: A | Discharge status: Home / Self Care | Payer: Managed Care, Other (non HMO) | Source: Ambulatory Visit | Attending: Neurology | Admitting: Neurology

## 2017-01-01 DIAGNOSIS — R51 Headache with orthostatic component, not elsewhere classified: Secondary | ICD-10-CM

## 2017-01-01 DIAGNOSIS — H4710 Unspecified papilledema: Secondary | ICD-10-CM

## 2017-01-01 DIAGNOSIS — H5713 Ocular pain, bilateral: Secondary | ICD-10-CM

## 2017-01-01 DIAGNOSIS — R519 Headache, unspecified: Secondary | ICD-10-CM

## 2017-01-01 DIAGNOSIS — H05119 Granuloma of unspecified orbit: Secondary | ICD-10-CM

## 2017-01-01 DIAGNOSIS — H547 Unspecified visual loss: Secondary | ICD-10-CM

## 2017-01-01 DIAGNOSIS — G08 Intracranial and intraspinal phlebitis and thrombophlebitis: Secondary | ICD-10-CM

## 2017-01-01 MED ORDER — GADOBENATE DIMEGLUMINE 529 MG/ML IV SOLN
20.0000 mL | Freq: Once | INTRAVENOUS | Status: AC | PRN
  Administered 2017-01-01: 20 mL via INTRAVENOUS

## 2017-01-05 ENCOUNTER — Telehealth: Payer: Self-pay | Admitting: *Deleted

## 2017-01-05 NOTE — Telephone Encounter (Signed)
-----   Message from Melvenia Beam, MD sent at 01/01/2017  6:46 PM EDT ----- She has a "partially empty sella" which can be seen normally but sometimes indicates IIH. However there is no extra fluid around the optic nerves or behind the eyes which we see with IIH so I think this is an unremarkable image. I still recommend repeat LP for opening pressure but she declines. Remindher of her appointment with Dr. Rexene Alberts our sleep doctor coming up as untreated sleep apnea can cause her symptoms and needs to be evaluated for. Thanks!

## 2017-01-05 NOTE — Telephone Encounter (Signed)
Called and spoke with patient. She verbalized understanding of unremarkable MRV, MRI brain, and result comments from Dr. Jaynee Eagles re: MR Orbits. She is aware that image overall looks unremarkable but that she has a "partially empty sella" which can be seen normally but sometimes indicates IIH. However there is no extra fluid around the optic nerves or behind the eyes which we see with IIH so Dr. Jaynee Eagles says this is unremarkable. However she still recommends a repeat LP for opening pressure. She stated she had already had 3 and she felt like that was enough but I told her to call us back if she changes her mind. I also reminded her of her appt with Dr. Rexene Alberts for sleep consult tomorrow @ 11:00 with an arrival time of 10:30. She verbalized understanding. I answered her questions.

## 2017-01-06 ENCOUNTER — Encounter: Payer: Self-pay | Admitting: Neurology

## 2017-01-06 ENCOUNTER — Ambulatory Visit (INDEPENDENT_AMBULATORY_CARE_PROVIDER_SITE_OTHER): Payer: Managed Care, Other (non HMO) | Admitting: Neurology

## 2017-01-06 ENCOUNTER — Other Ambulatory Visit (INDEPENDENT_AMBULATORY_CARE_PROVIDER_SITE_OTHER): Payer: Self-pay

## 2017-01-06 VITALS — BP 167/93 | HR 74 | Ht 69.0 in | Wt 376.0 lb

## 2017-01-06 DIAGNOSIS — Z6841 Body Mass Index (BMI) 40.0 and over, adult: Secondary | ICD-10-CM

## 2017-01-06 DIAGNOSIS — G4719 Other hypersomnia: Secondary | ICD-10-CM

## 2017-01-06 DIAGNOSIS — Z820 Family history of epilepsy and other diseases of the nervous system: Secondary | ICD-10-CM

## 2017-01-06 DIAGNOSIS — R519 Headache, unspecified: Secondary | ICD-10-CM

## 2017-01-06 DIAGNOSIS — R0683 Snoring: Secondary | ICD-10-CM | POA: Diagnosis not present

## 2017-01-06 DIAGNOSIS — R51 Headache: Principal | ICD-10-CM

## 2017-01-06 DIAGNOSIS — H4710 Unspecified papilledema: Secondary | ICD-10-CM

## 2017-01-06 DIAGNOSIS — H05119 Granuloma of unspecified orbit: Secondary | ICD-10-CM

## 2017-01-06 DIAGNOSIS — E538 Deficiency of other specified B group vitamins: Secondary | ICD-10-CM

## 2017-01-06 DIAGNOSIS — G08 Intracranial and intraspinal phlebitis and thrombophlebitis: Secondary | ICD-10-CM

## 2017-01-06 DIAGNOSIS — Z0289 Encounter for other administrative examinations: Secondary | ICD-10-CM

## 2017-01-06 DIAGNOSIS — G629 Polyneuropathy, unspecified: Secondary | ICD-10-CM

## 2017-01-06 DIAGNOSIS — E531 Pyridoxine deficiency: Secondary | ICD-10-CM

## 2017-01-06 DIAGNOSIS — G478 Other sleep disorders: Secondary | ICD-10-CM | POA: Diagnosis not present

## 2017-01-06 DIAGNOSIS — E519 Thiamine deficiency, unspecified: Secondary | ICD-10-CM

## 2017-01-06 DIAGNOSIS — G4761 Periodic limb movement disorder: Secondary | ICD-10-CM | POA: Diagnosis not present

## 2017-01-06 DIAGNOSIS — R351 Nocturia: Secondary | ICD-10-CM | POA: Diagnosis not present

## 2017-01-06 NOTE — Patient Instructions (Signed)

## 2017-01-06 NOTE — Progress Notes (Signed)
Subjective:    Patient ID: Katie Harris is a 41 y.o. female.  HPI     Star Age, MD, PhD [facility] [address], [address] [place], [place] [address]  Dear Katie Harris,   I saw your patient, Katie Harris, upon your kind request in my clinic today for initial consultation of her sleep disorder, in particular, concern for underlying obstructive sleep apnea. The patient is unaccompanied today. As you know, [name] is a 41 year old right-handed woman with an underlying medical history of idiopathic intracranial hypertension, anxiety, arthritis, factor V Leiden, recurrent headaches, hypertension, hyperthyroidism, history of neuropathy, and morbid obesity with a BMI of over 55, who reports snoring and morning headaches. She had an eye exam recently no papilledema. Tonsillectomy as a child. She is s/p RAI last year in September. Her father has OSA. Her husband has noted leg movements and twitching, while she is asleep.  I reviewed your office note from 12/04/2016. She had blood work today and felt a little lightheaded, improved after a little snack. Epworth sleepiness score is 5 out of 24, fatigue score is 38 out of 63. She is a nonsmoker and does not utilize alcohol, drinks caffeine about 2-3 servings per day. She lives at home with her husband and her 13 year old daughter. She works for a [employer], [employer]. She feels tired during the day and not fully rested when she first wakes up. She tries to be embedded before 11. Wakeup time is around 7. She denies telltale symptoms of restless leg syndrome. She had a tonsillectomy as a child. She has nocturia about once per average night. She has had morning headaches. Her father has sleep apnea. His snoring can be loud and disruptive to her husband's sleep.  Her Past Medical History Is Significant For: Past Medical History:  Diagnosis Date  . Anxiety   . Arthritis    lower back  . Blood dyscrasia    carrier for  Factor V   . Cancer ([place])    basal cell removed from left hairline area  . Diabetes mellitus without complication ([place])    recent A1C 6.5 no meds presently  . Factor 5 Leiden mutation, heterozygous ([place]) 04/21/2012   Lab 04/16/12 in view of positive Leiden in her father; no personal hx of thrombosis despite prior pregnancy & C-section 2007  . 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 HTN (hypertension), benign 04/21/2012  . Hypertension   . Hyperthyroidism    levels were abnormal in Dec- no treatment currently  . Hyperthyroidism 04/21/2012  . Peripheral vascular disease ([place])    neuropathy bilateral feet    Her Past Surgical History Is Significant For: Past Surgical History:  Procedure Laterality Date  . CESAREAN SECTION    . DILATATION & CURETTAGE/HYSTEROSCOPY WITH MYOSURE N/A 10/17/2016   Procedure: DILATATION & CURETTAGE/HYSTEROSCOPY WITH MYOSURE POLYPECTOMY;  Surgeon: Paula Compton, MD;  Location: [facility];  Service: Gynecology;  Laterality: N/A;  myosure rep will be here confirmed on 10/06/16  . DILATION AND CURETTAGE OF UTERUS     polpectomy  . INTRAUTERINE DEVICE (IUD) INSERTION N/A 10/17/2016   Procedure: INTRAUTERINE DEVICE (IUD) INSERTION MIRENA;  Surgeon: Paula Compton, MD;  Location: [facility];  Service: Gynecology;  Laterality: N/A;  . TONSILLECTOMY      Her Family History Is Significant For: Family History  Problem Relation Age of Onset  . Heart disease Father   . Diabetes Father   . Hypertension Father   .  Hyperlipidemia Father   . Diabetes Maternal Grandmother   . Hyperlipidemia Maternal Grandmother   . Hyperlipidemia Maternal Grandfather   . Hyperlipidemia Paternal Grandmother   . Diabetes Paternal Grandmother   . Cancer Paternal Grandmother   . Heart disease Paternal Grandfather     Her Social History Is Significant For: Social History   Social History  . Marital status: Married    Spouse name: N/A  . Number of children: N/A  . Years  of education: N/A   Social History Main Topics  . Smoking status: Never Smoker  . Smokeless tobacco: Never Used  . Alcohol use No  . Drug use: No  . Sexual activity: Yes    Birth control/ protection: None   Other Topics Concern  . None   Social History Narrative  . None    Her Allergies Are:  No Known Allergies:   Her Current Medications Are:  Outpatient Encounter Prescriptions as of 01/06/2017  Medication Sig  . acetaminophen (TYLENOL) 325 MG tablet Take 650 mg by mouth every 6 (six) hours as needed (for pain.).  Marland Kitchen cyclobenzaprine (FLEXERIL) 10 MG tablet Take 1 tablet (10 mg total) by mouth 3 (three) times daily as needed for muscle spasms. (Patient taking differently: Take 10 mg by mouth at bedtime. )  . DULoxetine (CYMBALTA) 60 MG capsule Take 60 mg by mouth daily.   . metoprolol (LOPRESSOR) 50 MG tablet Take 50 mg by mouth 2 (two) times daily.   . Multiple Vitamin (MULTIVITAMIN WITH MINERALS) TABS tablet Take 1 tablet by mouth daily. (Alive Adult Multivitamin)   No facility-administered encounter medications on file as of 01/06/2017.   :  Review of Systems:  Out of a complete 14 point review of systems, all are reviewed and negative with the exception of these symptoms as listed below: Review of Systems  Neurological:       Pt presents today to discuss her sleep. Pt has never had a sleep study but does endorse snoring.  Epworth Sleepiness Scale 0= would never doze 1= slight chance of dozing 2= moderate chance of dozing 3= high chance of dozing  Sitting and reading: 1 Watching TV: 1 Sitting inactive in a public place (ex. Theater or meeting): 0 As a passenger in a car for an hour without a break: 0 Lying down to rest in the afternoon: 2 Sitting and talking to someone: 0 Sitting quietly after lunch (no alcohol): 0 In a car, while stopped in traffic: 1 Total: 5     Objective:  Neurological Exam  Physical Exam Physical Examination:   Vitals:   01/06/17  1113  BP: (!) 167/93  Pulse: 74    General Examination: The patient is a very pleasant 41 y.o. female in no acute distress. She appears well-developed and well-nourished and well groomed.   HEENT: Normocephalic, atraumatic, pupils are equal, round and reactive to light and accommodation. Extraocular tracking is good without limitation to gaze excursion or nystagmus noted. Normal smooth pursuit is noted. Hearing is grossly intact. Face is symmetric with normal facial animation and normal facial sensation. Speech is clear with no dysarthria noted. There is no hypophonia. There is no lip, neck/Harris, jaw or voice tremor. Neck is supple with full range of passive and active motion. There are no carotid bruits on auscultation. Oropharynx exam reveals: mild mouth dryness, adequate dental hygiene and mild airway crowding secondary to smaller airway opening. Otherwise benign findings, tonsils are absent. Mallampati is class I. Neck circumference is 17 and  1/8 inches.   Chest: Clear to auscultation without wheezing, rhonchi or crackles noted.  Heart: S1+S2+0, regular and normal without murmurs, rubs or gallops noted.   Abdomen: Soft, non-tender and non-distended with normal bowel sounds appreciated on auscultation.  Extremities: There is no pitting edema in the distal lower extremities bilaterally. Pedal pulses are intact.  Skin: Warm and dry without trophic changes noted.  Musculoskeletal: exam reveals no obvious joint deformities, tenderness or joint swelling or erythema.   Neurologically:  Mental status: The patient is awake, alert and oriented in all 4 spheres. Her immediate and remote memory, attention, language skills and fund of knowledge are appropriate. There is no evidence of aphasia, agnosia, apraxia or anomia. Speech is clear with normal prosody and enunciation. Thought process is linear. Mood is normal and affect is normal.  Cranial nerves II - XII are as described above under HEENT exam.  In addition: shoulder shrug is normal with equal shoulder height noted. Motor exam: Normal bulk, strength and tone is noted. There is no drift, tremor or rebound. Romberg is negative. Reflexes are 2+ throughout. Fine motor skills and coordination: intact with normal finger taps, normal hand movements, normal rapid alternating patting, normal foot taps and normal foot agility.  Cerebellar testing: No dysmetria or intention tremor on finger to nose testing. Heel to shin is unremarkable bilaterally. There is no truncal or gait ataxia.  Sensory exam: intact to light touch in the upper and lower extremities.  Gait, station and balance: She stands easily. No veering to one side is noted. No leaning to one side is noted. Posture is age-appropriate and stance is narrow based. Gait shows normal stride length and normal pace. No problems turning are noted. Tandem walk is unremarkable.   Assessment and Plan:  In summary, EMILLIE CHASEN is a very pleasant 42 y.o.-year old female with an underlying medical history of idiopathic intracranial hypertension, anxiety, arthritis, factor V Leiden, recurrent headaches, hypertension, hyperthyroidism with s/p RAI in 9/17, history of neuropathy, and morbid obesity with a BMI of over 55, whose history and physical exam are concerning for obstructive sleep apnea (OSA). She also reports PLMs.  I had a long chat with the patient about my findings and the diagnosis of OSA, its prognosis and treatment options. We talked about medical treatments, surgical interventions and non-pharmacological approaches. I explained in particular the risks and ramifications of untreated moderate to severe OSA, especially with respect to developing cardiovascular disease down the Road, including congestive heart failure, difficult to treat hypertension, cardiac arrhythmias, or stroke. Even type 2 diabetes has, in part, been linked to untreated OSA. Symptoms of untreated OSA include daytime sleepiness,  memory problems, mood irritability and mood disorder such as depression and anxiety, lack of energy, as well as recurrent headaches, especially morning headaches. We talked about trying to maintain a healthy lifestyle in general, as well as the importance of weight control. I encouraged the patient to eat healthy, exercise daily and keep well hydrated, to keep a scheduled bedtime and wake time routine, to not skip any meals and eat healthy snacks in between meals. I advised the patient not to drive when feeling sleepy. I recommended the following at this time: sleep study with potential positive airway pressure titration. (We will score hypopneas at 4%).   I explained the sleep test procedure to the patient and also outlined possible surgical and non-surgical treatment options of OSA, including the use of a custom-made dental device (which would require a referral to a  specialist dentist or oral surgeon), upper airway surgical options, such as pillar implants, radiofrequency surgery, tongue base surgery, and UPPP (which would involve a referral to an ENT surgeon). Rarely, jaw surgery such as mandibular advancement may be considered.  I also explained the CPAP treatment option to the patient, who indicated that she would be willing to try CPAP if the need arises. I explained the importance of being compliant with PAP treatment, not only for insurance purposes but primarily to improve Her symptoms, and for the patient's long term health benefit, including to reduce Her cardiovascular risks. I answered all her questions today and the patient was in agreement. I will likely see her back after the sleep study is completed and encouraged her to call with any interim questions, concerns, problems or updates.   Thank you very much for allowing me to participate in the care of this nice patient. If I can be of any further assistance to you please do not hesitate to talk to me.  Sincerely,   Star Age, MD,  PhD

## 2017-01-08 ENCOUNTER — Telehealth: Payer: Self-pay | Admitting: Neurology

## 2017-01-08 NOTE — Telephone Encounter (Signed)
Dr. Jaynee Eagles to review results. Will call when they are ready for release to patient.

## 2017-01-08 NOTE — Telephone Encounter (Signed)
Pt is requesting a call when result of blood work is available

## 2017-01-09 LAB — TISSUE TRANSGLUTAMINASE, IGA

## 2017-01-09 LAB — SEDIMENTATION RATE: Sed Rate: 29 mm/hr (ref 0–32)

## 2017-01-09 LAB — PAN-ANCA
ANCA PROTEINASE 3: 5.1 U/mL — AB (ref 0.0–3.5)
Atypical pANCA: <1:20 {titer}
P-ANCA: <1:20 {titer}

## 2017-01-09 LAB — HEAVY METALS, BLOOD
Arsenic: 8 ug/L (ref 2–23)
LEAD, BLOOD: NOT DETECTED ug/dL (ref 0–4)
Mercury: NOT DETECTED ug/L (ref 0.0–14.9)

## 2017-01-09 LAB — RHEUMATOID FACTOR

## 2017-01-09 LAB — SJOGREN'S SYNDROME ANTIBODS(SSA + SSB)

## 2017-01-09 LAB — TSH: TSH: 4.26 u[IU]/mL (ref 0.450–4.500)

## 2017-01-09 LAB — B. BURGDORFI ANTIBODIES: Lyme IgG/IgM Ab: <0.91 {ISR} (ref 0.00–0.90)

## 2017-01-09 LAB — BASIC METABOLIC PANEL
BUN / CREAT RATIO: 12 (ref 9–23)
BUN: 8 mg/dL (ref 6–24)
CO2: 23 mmol/L (ref 20–29)
CREATININE: 0.68 mg/dL (ref 0.57–1.00)
Calcium: 8.8 mg/dL (ref 8.7–10.2)
Chloride: 98 mmol/L (ref 96–106)
GFR calc non Af Amer: 107 mL/min/{1.73_m2} (ref >59)
GFR, EST AFRICAN AMERICAN: 124 mL/min/{1.73_m2} (ref >59)
Glucose: 180 mg/dL — ABNORMAL HIGH (ref 65–99)
Potassium: 4.3 mmol/L (ref 3.5–5.2)
Sodium: 136 mmol/L (ref 134–144)

## 2017-01-09 LAB — B12 AND FOLATE PANEL
Folate: 15.5 ng/mL (ref >3.0)
VITAMIN B 12: 562 pg/mL (ref 232–1245)

## 2017-01-09 LAB — VITAMIN B6: VITAMIN B6: 7.2 ug/L (ref 2.0–32.8)

## 2017-01-09 LAB — METHYLMALONIC ACID, SERUM: Methylmalonic Acid: 158 nmol/L (ref 0–378)

## 2017-01-09 LAB — GLIADIN ANTIBODIES, SERUM
ANTIGLIADIN ABS, IGA: 9 U (ref 0–19)
Gliadin IgG: 2 units (ref 0–19)

## 2017-01-09 LAB — ANGIOTENSIN CONVERTING ENZYME: ANGIO CONVERT ENZYME: 49 U/L (ref 14–82)

## 2017-01-09 LAB — ANA W/REFLEX: Anti Nuclear Antibody(ANA): NEGATIVE

## 2017-01-09 LAB — HEPATITIS C ANTIBODY: Hep C Virus Ab: <0.1 s/co ratio (ref 0.0–0.9)

## 2017-01-09 LAB — HIV ANTIBODY (ROUTINE TESTING W REFLEX): HIV Screen 4th Generation wRfx: NONREACTIVE

## 2017-01-09 LAB — RPR: RPR Ser Ql: NONREACTIVE

## 2017-01-12 ENCOUNTER — Telehealth: Payer: Self-pay | Admitting: *Deleted

## 2017-01-12 NOTE — Telephone Encounter (Addendum)
Called and spoke with patient. I informed her that on her labs, her glucose is elevated at 180, and her last hemoglobin A1C of 6.5 is in the Diabetes range. This is likely the cause of neuropathy in her feet. She verbalized understanding and said that she has an upcoming appt with the endocrinologist and will d/w her. She did state her Glucose has only been elevated 3x before and the one time of Hbg A1C of 6.5, so her other doctors didn't think this was cause as she has not had Diabetes diagnosis for years. She had no further concerns.     ----- Message from Melvenia Beam, MD sent at 01/11/2017 12:15 PM EST ----- Her glucose is elevated at 180, last hgba1c was 6.5 diabetic, this is likely the cause of the neuropathy in her feet. Please tell patient otherwise Labs unremarkable.  ( for documentation purposes of this note for chart, the detection of MPO-ANCA or PR3-ANCA without a positive IF(C-ANCA) rarely due to a systemic vasculitis, clinically  insignificant)

## 2017-02-03 ENCOUNTER — Ambulatory Visit: Payer: Managed Care, Other (non HMO) | Admitting: Neurology

## 2017-03-12 ENCOUNTER — Ambulatory Visit: Payer: Managed Care, Other (non HMO) | Admitting: Internal Medicine

## 2017-04-09 ENCOUNTER — Encounter (INDEPENDENT_AMBULATORY_CARE_PROVIDER_SITE_OTHER): Payer: Managed Care, Other (non HMO)

## 2017-04-10 ENCOUNTER — Telehealth: Payer: Self-pay | Admitting: *Deleted

## 2017-04-10 NOTE — Telephone Encounter (Signed)
Patient spoke with [facility] in phone room and rescheduled her appt that had been canceled in November. That appt was scheduled for 1 hour. The r/s is May 19, 2017 @ 3:00 in a 30 minute slot. Will d/w Dr. Jaynee Eagles to see if appt needs to be changed.

## 2017-04-12 NOTE — Telephone Encounter (Signed)
No 30 minutes is fine thank you

## 2017-04-13 NOTE — Telephone Encounter (Signed)
error 

## 2017-04-17 ENCOUNTER — Ambulatory Visit (INDEPENDENT_AMBULATORY_CARE_PROVIDER_SITE_OTHER): Payer: Managed Care, Other (non HMO) | Admitting: Neurology

## 2017-04-17 DIAGNOSIS — G4761 Periodic limb movement disorder: Secondary | ICD-10-CM | POA: Diagnosis not present

## 2017-04-17 DIAGNOSIS — R51 Headache: Secondary | ICD-10-CM

## 2017-04-17 DIAGNOSIS — R351 Nocturia: Secondary | ICD-10-CM

## 2017-04-17 DIAGNOSIS — R0683 Snoring: Secondary | ICD-10-CM

## 2017-04-17 DIAGNOSIS — R519 Headache, unspecified: Secondary | ICD-10-CM

## 2017-04-17 DIAGNOSIS — Z820 Family history of epilepsy and other diseases of the nervous system: Secondary | ICD-10-CM

## 2017-04-17 DIAGNOSIS — G478 Other sleep disorders: Secondary | ICD-10-CM

## 2017-04-17 DIAGNOSIS — G4720 Circadian rhythm sleep disorder, unspecified type: Secondary | ICD-10-CM

## 2017-04-17 DIAGNOSIS — Z6841 Body Mass Index (BMI) 40.0 and over, adult: Secondary | ICD-10-CM

## 2017-04-17 DIAGNOSIS — G4733 Obstructive sleep apnea (adult) (pediatric): Secondary | ICD-10-CM

## 2017-04-17 DIAGNOSIS — G4719 Other hypersomnia: Secondary | ICD-10-CM

## 2017-04-20 ENCOUNTER — Encounter: Payer: Self-pay | Admitting: Neurology

## 2017-04-20 ENCOUNTER — Other Ambulatory Visit: Payer: Self-pay | Admitting: Neurology

## 2017-04-20 ENCOUNTER — Telehealth: Payer: Self-pay

## 2017-04-20 DIAGNOSIS — G4733 Obstructive sleep apnea (adult) (pediatric): Secondary | ICD-10-CM

## 2017-04-20 NOTE — Telephone Encounter (Signed)
I called pt to discuss her sleep study results. No answer, left a message asking her to call me back. 

## 2017-04-20 NOTE — Telephone Encounter (Signed)
-----   Message from Star Age, MD sent at 04/20/2017  7:56 AM EST ----- Patient referred by Dr. Jaynee Eagles, seen by me on 01/06/17, diagnostic PSG on 04/17/17.    Please call and notify the patient that the recent sleep study did confirm the diagnosis of obstructive sleep apnea. OSA is overall mild, but worth treating to see if she feels better after treatment. To that end I recommend treatment for this in the form of autoPAP, which means, that we don't have to bring her back for a second sleep study with CPAP, but will let him try an autoPAP machine at home, through a DME company (of her choice, or as per insurance requirement). The DME representative will educate her on how to use the machine, how to put the mask on, etc. I have placed an order in the chart. Please send referral, talk to patient, send report to referring MD. We will need a FU in sleep clinic for 10 weeks post-PAP set up, please arrange that with me or one of our NPs. Thanks,   Star Age, MD, PhD [facility])

## 2017-04-20 NOTE — Procedures (Signed)
PATIENT'S NAME:  Katie Harris, Peitz DOB:      Mar 25, 1975      MR#:    [id number]     DATE OF RECORDING: 04/17/2017 REFERRING M.D.: Dr. Jaynee Eagles,         PCP: Milagros Evener MD Study Performed: Baseline Polysomnogram HISTORY: 42 year old woman with a history of idiopathic intracranial hypertension, anxiety, arthritis, factor V Leiden, recurrent headaches, hypertension, hyperthyroidism, history of neuropathy, and morbid obesity, who reports snoring and morning headaches. The patient endorsed the Epworth Sleepiness Scale at 5 points. The patient's weight 377 pounds with a height of 69 (inches), resulting in a BMI of 55.8 kg/m2. The patient's neck circumference measured 17 inches.  CURRENT MEDICATIONS: Tylenol, Flexeril, Cymbalta, Lopressor   PROCEDURE:  This is a multichannel digital polysomnogram utilizing the Somnostar 11.2 system.  Electrodes and sensors were applied and monitored per AASM Specifications.   EEG, EOG, Chin and Limb EMG, were sampled at 200 Hz.  ECG, Snore and Nasal Pressure, Thermal Airflow, Respiratory Effort, CPAP Flow and Pressure, Oximetry was sampled at 50 Hz. Digital video and audio were recorded.      BASELINE STUDY  Lights Out was at 21:13 and Lights On at 05:00.  Total recording time (TRT) was 467.5 minutes, with a total sleep time (TST) of  323 minutes.   The patient's sleep latency was 48 minutes, which is delayed, REM latency was 325.5 minutes, which is markedly delayed. The sleep efficiency was 69.1 %, which is reduced.     SLEEP ARCHITECTURE: WASO (Wake after sleep onset) was 98 minutes with moderate to severe sleep fragmentation noted.  There were 50 minutes in Stage N1, 130.5 minutes Stage N2, 112 minutes Stage N3 and 30.5 minutes in Stage REM.  The percentage of Stage N1 was 15.5%, which is increased, Stage N2 was 40.4%, Stage N3 was 34.7%, which is increased, and Stage R (REM sleep) was 9.4%, which is reduced. The arousals were noted as: 67 were spontaneous, 11 were  associated with PLMs, 27 were associated with respiratory events.  Audio and video analysis did not show any abnormal or unusual movements, behaviors, phonations or vocalizations. The patient took 1 bathroom break. Mild to moderate, and, at times loud snoring was noted. The EKG was in keeping with normal sinus rhythm (NSR).  RESPIRATORY ANALYSIS:  There were a total of 45 respiratory events:  2 obstructive apneas, 0 central apneas and 0 mixed apneas with a total of 2 apneas and an apnea index (AI) of .4 /hour. There were 43 hypopneas with a hypopnea index of 8. /hour. The patient also had 0 respiratory event related arousals (RERAs).      The total APNEA/HYPOPNEA INDEX (AHI) was 8.4/hour and the total RESPIRATORY DISTURBANCE INDEX was 8.4 /hour.  6 events occurred in REM sleep and 78 events in NREM. The REM AHI was 11.8 /hour, versus a non-REM AHI of 8. The patient spent 0 minutes of total sleep time in the supine position and 323 minutes in non-supine.. The supine AHI was n/a versus a non-supine AHI of 8.4.  OXYGEN SATURATION & C02:  The Wake baseline 02 saturation was 96%, with the lowest being 83%. Time spent below 89% saturation equaled 16 minutes.  PERIODIC LIMB MOVEMENTS: The patient had a total of 45 Periodic Limb Movements.  The Periodic Limb Movement (PLM) index was 8.4 and the PLM Arousal index was 2./hour.  Post-study, the patient indicated that sleep was the same as usual.   IMPRESSION:  1. Obstructive Sleep Apnea (  OSA) 2. Dysfunctions associated with sleep stages or arousal from sleep  RECOMMENDATIONS:  1. This study demonstrates overall mild obstructive sleep apnea with a total AHI of 8.4/hour, REM AHI of 11.8/hour, and O2 nadir of 83%. The absence of supine sleep likely underestimates her AHI and O2 nadir. Given the patient's medical history and sleep related complaints, treatment with positive airway pressure is recommended; this can be achieved in the form of autoPAP.  Alternatively, a full-night CPAP titration study would allow optimization of therapy if needed. Other treatment options may include avoidance of supine sleep position along with weight loss, upper airway or jaw surgery in selected patients or the use of an oral appliance in certain patients. ENT evaluation and/or consultation with a maxillofacial surgeon or dentist may be feasible in some instances.    2. Please note that untreated obstructive sleep apnea carries potential additional perioperative morbidity. Patients with significant obstructive sleep apnea should receive perioperative PAP therapy and the surgeons and particularly the anesthesiologist should be informed of the diagnosis and the severity of the sleep disordered breathing. 3. This study shows significant sleep fragmentation and abnormal sleep stage percentages; these are nonspecific findings and per se do not signify an intrinsic sleep disorder or a cause for the patient's sleep-related symptoms. Causes include (but are not limited to) the first night effect of the sleep study, circadian rhythm disturbances, medication effect or an underlying mood disorder or medical problem.  4. The patient should be cautioned not to drive, work at heights, or operate dangerous or heavy equipment when tired or sleepy. Review and reiteration of good sleep hygiene measures should be pursued with any patient. 5. The patient will be seen in follow-up by Dr. Rexene Alberts at [facility] for discussion of the test results and further management strategies. The referring provider will be notified of the test results.  I certify that I have reviewed the entire raw data recording prior to the issuance of this report in accordance with the Standards of Accreditation of the American Academy of Sleep Medicine (AASM)   Star Age, MD, PhD Diplomat, American Board of Psychiatry and Neurology (Neurology and Sleep Medicine)

## 2017-04-20 NOTE — Telephone Encounter (Signed)
Pt returned my call. I advised pt that Dr. Rexene Alberts reviewed their sleep study results and found that pt pt has overall mild osa. Dr. Rexene Alberts recommends that pt start an auto-pap at home. I reviewed PAP compliance expectations with the pt. Pt is agreeable to starting an auto-PAP. I advised pt that an order will be sent to a DME, [employer], and [employer] will call the pt within about one week after they file with the pt's insurance. [employer] will show the pt how to use the machine, fit for masks, and troubleshoot the auto-PAP if needed. A follow up appt was made for insurance purposes with Dr. Rexene Alberts on 07/09/17 at 3:00pm. Pt verbalized understanding to arrive 15 minutes early and bring their auto-PAP. A letter with all of this information in it will be sent to pt's mychart as a reminder.  Pt verbalized understanding of results. Pt had no questions at this time but was encouraged to call back if questions arise.

## 2017-04-20 NOTE — Progress Notes (Signed)
Patient referred by Dr. Jaynee Eagles, seen by me on 01/06/17, diagnostic PSG on 04/17/17.    Please call and notify the patient that the recent sleep study did confirm the diagnosis of obstructive sleep apnea. OSA is overall mild, but worth treating to see if she feels better after treatment. To that end I recommend treatment for this in the form of autoPAP, which means, that we don't have to bring her back for a second sleep study with CPAP, but will let him try an autoPAP machine at home, through a DME company (of her choice, or as per insurance requirement). The DME representative will educate her on how to use the machine, how to put the mask on, etc. I have placed an order in the chart. Please send referral, talk to patient, send report to referring MD. We will need a FU in sleep clinic for 10 weeks post-PAP set up, please arrange that with me or one of our NPs. Thanks,   Katie Age, MD, PhD [facility])

## 2017-04-22 ENCOUNTER — Ambulatory Visit (INDEPENDENT_AMBULATORY_CARE_PROVIDER_SITE_OTHER): Payer: Managed Care, Other (non HMO) | Admitting: Family Medicine

## 2017-04-22 ENCOUNTER — Encounter (INDEPENDENT_AMBULATORY_CARE_PROVIDER_SITE_OTHER): Payer: Self-pay | Admitting: Family Medicine

## 2017-04-22 VITALS — BP 138/82 | HR 73 | Temp 98.4°F | Ht 69.0 in | Wt 362.0 lb

## 2017-04-22 DIAGNOSIS — I10 Essential (primary) hypertension: Secondary | ICD-10-CM | POA: Diagnosis not present

## 2017-04-22 DIAGNOSIS — Z0289 Encounter for other administrative examinations: Secondary | ICD-10-CM

## 2017-04-22 DIAGNOSIS — E890 Postprocedural hypothyroidism: Secondary | ICD-10-CM | POA: Insufficient documentation

## 2017-04-22 DIAGNOSIS — R7303 Prediabetes: Secondary | ICD-10-CM

## 2017-04-22 DIAGNOSIS — Z6841 Body Mass Index (BMI) 40.0 and over, adult: Secondary | ICD-10-CM

## 2017-04-22 DIAGNOSIS — R0602 Shortness of breath: Secondary | ICD-10-CM

## 2017-04-22 DIAGNOSIS — Z1331 Encounter for screening for depression: Secondary | ICD-10-CM | POA: Diagnosis not present

## 2017-04-22 DIAGNOSIS — R5383 Other fatigue: Secondary | ICD-10-CM | POA: Diagnosis not present

## 2017-04-22 DIAGNOSIS — Z9189 Other specified personal risk factors, not elsewhere classified: Secondary | ICD-10-CM

## 2017-04-22 DIAGNOSIS — E038 Other specified hypothyroidism: Secondary | ICD-10-CM | POA: Diagnosis not present

## 2017-04-22 NOTE — Progress Notes (Signed)
Office: [phone]  /  Fax: [phone]   Dear Dr. Jaynee Eagles,   Thank you for referring Katie Harris to our clinic. The following note includes my evaluation and treatment recommendations.  HPI:   Chief Complaint: OBESITY    Katie Harris has been referred by Berta Minor B. Jaynee Eagles, MD for consultation regarding her obesity and obesity related comorbidities.    Katie Harris (MR# [id number]) is a 42 y.o. female who presents on 04/22/2017 for obesity evaluation and treatment. Current BMI is Body mass index is 53.46 kg/m.Marland Kitchen Katie Harris has been struggling with her weight for many years and has been unsuccessful in either losing weight, maintaining weight loss, or reaching her healthy weight goal.     Katie Harris attended our information session and states she is currently in the action stage of change and ready to dedicate time achieving and maintaining a healthier weight. Katie Harris is interested in becoming our patient and working on intensive lifestyle modifications including (but not limited to) diet, exercise and weight loss.    Katie Harris states her family eats meals together she thinks her family will eat healthier with  her she struggles with family and or coworkers weight loss sabotage her desired weight loss is 182 lbs she has been heavy most of  her life she started gaining weight when prescribed steroids for long periods her heaviest weight ever was 374 lbs. she has significant food cravings issues  she snacks frequently in the evenings she is frequently drinking liquids with calories she frequently makes poor food choices she frequently eats larger portions than normal  she struggles with emotional eating    Fatigue [name] feels her energy is lower than it should be. This has worsened with weight gain and has not worsened recently. [name] admits to daytime somnolence and  admits to waking up still tired. Patient is at risk for obstructive sleep apnea. Patent has a history of symptoms of daytime  fatigue and morning headache. Patient generally gets 8 hours of sleep per night, and states they generally have generally restful sleep. Snoring is present. Apneic episodes are not present. Epworth Sleepiness Score is 6.  Dyspnea on exertion Katie Harris notes increasing shortness of breath with exercising and seems to be worsening over time with weight gain. She notes getting out of breath sooner with activity than she used to. This has not gotten worse recently. Katie Harris denies orthopnea.  Pre-Diabetes Katie Harris has a diagnosis of pre-diabetes last year based on her elevated Hgb A1c and was informed this puts her at greater risk of developing diabetes. She is not metformin currently and continues to work on diet and exercise to decrease risk of diabetes. She notes polyphagia and denies nausea or hypoglycemia.  At risk for diabetes Katie Harris is at higher than average risk for developing diabetes due to her obesity and pre-diabetes. She currently denies polyuria or polydipsia.  Hyperthyroid Katie Harris has a diagnosis of hyperthyroidism. She is status post RI 2017. She is not on levothyroxine. She denies hot or cold intolerance or palpitations, but does admit to ongoing fatigue.  Hypertension Katie Harris is a 42 y.o. female with hypertension. [name] is on metoprolol, she denies chest pain or headache and would like to diet control to help control her blood pressure with the goal of decreasing her risk of heart attack and stroke. Katie Harris blood pressure is currently controlled.  Depression Screen Katie Harris Food and Mood (modified PHQ-9) score was  Depression screen PHQ 2/9 04/22/2017  Decreased Interest 2  Down, Depressed,  Hopeless 3  PHQ - 2 Score 5  Altered sleeping 1  Tired, decreased energy 3  Change in appetite 2  Feeling bad or failure about yourself  3  Trouble concentrating 0  Moving slowly or fidgety/restless 1  Suicidal thoughts 1  PHQ-9 Score 16  Difficult doing work/chores Very difficult     ALLERGIES: No Known Allergies  MEDICATIONS: Current Outpatient Medications on File Prior to Visit  Medication Sig Dispense Refill  . cyclobenzaprine (FLEXERIL) 10 MG tablet Take 1 tablet (10 mg total) by mouth 3 (three) times daily as needed for muscle spasms. (Patient taking differently: Take 10 mg by mouth at bedtime. ) 60 tablet 0  . DULoxetine (CYMBALTA) 60 MG capsule Take 60 mg by mouth daily.   6  . metoprolol (LOPRESSOR) 50 MG tablet Take 50 mg by mouth 2 (two) times daily.      No current facility-administered medications on file prior to visit.     PAST MEDICAL HISTORY: Past Medical History:  Diagnosis Date  . Anxiety   . Arthritis    lower back  . Back pain   . 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 Cancer ([place])    basal cell removed from left hairline area  . Depression   . Diabetes mellitus without complication ([place])    recent A1C 6.5 no meds presently  . Factor 5 Leiden mutation, heterozygous ([place]) 04/21/2012   Lab 04/16/12 in view of positive Leiden in her father; no personal hx of thrombosis despite prior pregnancy & C-section 2007  . 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 HTN (hypertension), benign 04/21/2012  . Hypertension   . Hyperthyroidism    levels were abnormal in Dec- no treatment currently  . Hyperthyroidism 04/21/2012  . Infertility, female   . Leg edema   . PCOS (polycystic ovarian syndrome)   . Peripheral vascular disease (HCC)    neuropathy bilateral feet  . Prediabetes   . Sleep apnea     PAST SURGICAL HISTORY: Past Surgical History:  Procedure Laterality Date  . CESAREAN SECTION    . DILATATION & CURETTAGE/HYSTEROSCOPY WITH MYOSURE N/A 10/17/2016   Procedure: DILATATION & CURETTAGE/HYSTEROSCOPY WITH MYOSURE POLYPECTOMY;  Surgeon: Paula Compton, MD;  Location: [facility];  Service: Gynecology;  Laterality: N/A;  myosure rep will be here confirmed on 10/06/16  . DILATION AND CURETTAGE OF UTERUS     polpectomy  .  INTRAUTERINE DEVICE (IUD) INSERTION N/A 10/17/2016   Procedure: INTRAUTERINE DEVICE (IUD) INSERTION MIRENA;  Surgeon: Paula Compton, MD;  Location: [facility];  Service: Gynecology;  Laterality: N/A;  . TONSILLECTOMY      SOCIAL HISTORY: Social History   Tobacco Use  . Smoking status: Never Smoker  . Smokeless tobacco: Never Used  Substance Use Topics  . Alcohol use: No  . Drug use: No    FAMILY HISTORY: Family History  Problem Relation Age of Onset  . Hypertension Mother   . Hyperlipidemia Mother   . Thyroid disease Mother   . Depression Mother   . Anxiety disorder Mother   . Sleep apnea Mother   . Obesity Mother   . Heart disease Father   . Diabetes Father   . Hypertension Father   . Hyperlipidemia Father   . Obesity Father   . Diabetes Maternal Grandmother   . Hyperlipidemia Maternal Grandmother   . Hyperlipidemia Maternal Grandfather   . Hyperlipidemia Paternal Grandmother   . Diabetes Paternal  Grandmother   . Cancer Paternal Grandmother   . Heart disease Paternal Grandfather     ROS: Review of Systems  Constitutional: Positive for malaise/fatigue. Negative for weight loss.       + Heat intolerance  HENT: Positive for ear pain and tinnitus.   Eyes: Positive for pain.  Respiratory: Positive for shortness of breath (with exertion).   Cardiovascular: Negative for chest pain, palpitations and orthopnea.  Gastrointestinal: Negative for nausea.  Genitourinary: Negative for frequency.  Musculoskeletal: Positive for back pain and neck pain.  Neurological: Negative for headaches.  Endo/Heme/Allergies: Negative for polydipsia.       Positive polyphagia Negative hypoglycemia  Psychiatric/Behavioral: Positive for depression. Negative for suicidal ideas.       + Stress    PHYSICAL EXAM: Blood pressure 138/82, pulse 73, temperature 98.4 F (36.9 C), temperature source Oral, height 5\' 9"  (1.753 m), weight (!) 362 lb (164.2 kg), last menstrual period 10/09/2016, SpO2  97 %. Body mass index is 53.46 kg/m. Physical Exam  Constitutional: She is oriented to person, place, and time. She appears well-developed and well-nourished.  HENT:  Head: Normocephalic and atraumatic.  Nose: Nose normal.  Eyes: EOM are normal. No scleral icterus.  Neck: Normal range of motion. Neck supple. No thyromegaly present.  Cardiovascular: Normal rate and regular rhythm.  Pulmonary/Chest: Effort normal. No respiratory distress.  Abdominal: Soft. There is no tenderness.  + Obesity  Musculoskeletal:  Range of Motion normal in all 4 extremities Trace edema noted in bilateral lower extremities  Neurological: She is alert and oriented to person, place, and time. Coordination normal.  Skin: Skin is warm and dry.  Psychiatric: She has a normal mood and affect. Her behavior is normal.  Vitals reviewed.   RECENT LABS AND TESTS: BMET    Component Value Date/Time   NA 136 01/06/2017 1046   K 4.3 01/06/2017 1046   CL 98 01/06/2017 1046   CO2 23 01/06/2017 1046   GLUCOSE 180 (H) 01/06/2017 1046   GLUCOSE 149 (H) 10/09/2016 0950   BUN 8 01/06/2017 1046   CREATININE 0.68 01/06/2017 1046   CALCIUM 8.8 01/06/2017 1046   GFRNONAA 107 01/06/2017 1046   GFRAA 124 01/06/2017 1046   No results found for: HGBA1C No results found for: INSULIN CBC    Component Value Date/Time   WBC 8.6 10/09/2016 0950   RBC 4.26 10/09/2016 0950   HGB 13.0 10/09/2016 0950   HCT 38.5 10/09/2016 0950   PLT 294 10/09/2016 0950   MCV 90.4 10/09/2016 0950   MCH 30.5 10/09/2016 0950   MCHC 33.8 10/09/2016 0950   RDW 14.2 10/09/2016 0950   LYMPHSABS 2.5 07/09/2016 1233   MONOABS 0.5 07/09/2016 1233   EOSABS 0.2 07/09/2016 1233   BASOSABS 0.1 07/09/2016 1233   Iron/TIBC/Ferritin/ %Sat No results found for: IRON, TIBC, FERRITIN, IRONPCTSAT Lipid Panel  No results found for: CHOL, TRIG, HDL, CHOLHDL, VLDL, LDLCALC, LDLDIRECT Hepatic Function Panel     Component Value Date/Time   PROT 6.7  07/09/2016 1233   ALBUMIN 3.5 07/09/2016 1233   AST 23 07/09/2016 1233   ALT 22 07/09/2016 1233   ALKPHOS 65 07/09/2016 1233   BILITOT 0.6 07/09/2016 1233      Component Value Date/Time   TSH 4.260 01/06/2017 1046   TSH 2.78 07/29/2016 1655   TSH 2.37 03/31/2016 1659    ECG  shows NSR with a rate of 72 BPM INDIRECT CALORIMETER done today shows a VO2 of 425 and a REE  of 2959.  Her calculated basal metabolic rate is 4008 thus her basal metabolic rate is better than expected.    ASSESSMENT AND PLAN: Other fatigue - Plan: EKG 12-Lead, Vitamin B12, CBC With Differential, Folate, Lipid Panel With LDL/HDL Ratio, VITAMIN D 25 Hydroxy (Vit-D Deficiency, Fractures)  Shortness of breath on exertion  Prediabetes - Plan: Comprehensive metabolic panel, Hemoglobin A1c, Insulin, random  Other specified hypothyroidism - Plan: T3, T4, free, TSH  Essential hypertension  Depression screening  At risk for diabetes mellitus  Class 3 severe obesity with serious comorbidity and body mass index (BMI) of 50.0 to 59.9 in adult, unspecified obesity type (HCC)  PLAN:  Fatigue Arrington was informed that her fatigue may be related to obesity, depression or many other causes. Labs will be ordered, and in the meanwhile Stayce has agreed to work on diet, exercise and weight loss to help with fatigue. Proper sleep hygiene was discussed including the need for 7-8 hours of quality sleep each night. A sleep study was not ordered based on symptoms and Epworth score.  Dyspnea on exertion Herma's shortness of breath appears to be obesity related and exercise induced. She has agreed to work on weight loss and gradually increase exercise to treat her exercise induced shortness of breath. If Lashai follows our instructions and loses weight without improvement of her shortness of breath, we will plan to refer to pulmonology. We will monitor this condition regularly. Thanya agrees to this plan.  Pre-Diabetes Annalena will  continue to work on weight loss, exercise, and decreasing simple carbohydrates in her diet to help decrease the risk of diabetes. We dicussed metformin including benefits and risks. She was informed that eating too many simple carbohydrates or too many calories at one sitting increases the likelihood of GI side effects. Kacelyn declined metformin for now and a prescription was not written today. We will check labs and Aricka agrees to follow up with our clinic in 2 weeks as directed to monitor her progress.  Diabetes risk counselling Katie Harris was given extended (15 minutes) diabetes prevention counseling today. She is 42 y.o. female and has risk factors for diabetes including obesity and pre-diabetes. We discussed intensive lifestyle modifications today with an emphasis on weight loss as well as increasing exercise and decreasing simple carbohydrates in her diet.  Hyperthyroid Katie Harris was informed of the importance of good thyroid control to help with weight loss efforts. She was also informed that supertheraputic thyroid levels are dangerous and will not improve weight loss results. We will check labs and Katie Harris agrees to follow up with our clinic in 2 weeks.  Hypertension We discussed sodium restriction, working on healthy weight loss, and a regular exercise program as the means to achieve improved blood pressure control. Moncerrat agreed with this plan and agreed to follow up as directed. We will continue to monitor her blood pressure as well as her progress with the above lifestyle modifications. She will continue diet and exercise, and will continue metoprolol as prescribed and will watch for signs of hypotension as she continues her lifestyle modifications. We will check labs and Para agrees to follow up with our clinic in 2 weeks.  Depression Screen Shakeila had a strongly positive depression screening. Depression is commonly associated with obesity and often results in emotional eating behaviors. We will  monitor this closely and work on CBT to help improve the non-hunger eating patterns. Referral to Psychology may be required if no improvement is seen as she continues in our clinic.  Obesity  Shloka is currently in the action stage of change and her goal is to continue with weight loss efforts. I recommend Kyarra begin the structured treatment plan as follows:  She has agreed to follow the Category 3 plan + 300 calories Devynne has been instructed to eventually work up to a goal of 150 minutes of combined cardio and strengthening exercise per week for weight loss and overall health benefits. We discussed the following Behavioral Modification Strategies today: increasing lean protein intake, decrease eating out, work on meal planning and easy cooking plans and dealing with family or coworker sabotage   She was informed of the importance of frequent follow up visits to maximize her success with intensive lifestyle modifications for her multiple health conditions. She was informed we would discuss her lab results at her next visit unless there is a critical issue that needs to be addressed sooner. Carolena agreed to keep her next visit at the agreed upon time to discuss these results.    OBESITY BEHAVIORAL INTERVENTION VISIT  Today's visit was # 1 out of 22.  Starting weight: 362 lbs Starting date: 04/22/17 Today's weight : 362 lbs  Today's date: 04/22/2017 Total lbs lost to date: 0 (Patients must lose 7 lbs in the first 6 months to continue with counseling)   ASK: We discussed the diagnosis of obesity with [name] today and Takeesha agreed to give [name] permission to discuss obesity behavioral modification therapy today.  ASSESS: Marlena has the diagnosis of obesity and her BMI today is 53.43 Jasmarie is in the action stage of change   ADVISE: Adalie was educated on the multiple health risks of obesity as well as the benefit of weight loss to improve her health. She was advised of the need for long  term treatment and the importance of lifestyle modifications.  AGREE: Multiple dietary modification options and treatment options were discussed and  Rosi agreed to the above obesity treatment plan.   I, Trixie Dredge, am acting as transcriptionist for Dennard Nip, MD  I have reviewed the above documentation for accuracy and completeness, and I agree with the above. -Dennard Nip, MD

## 2017-04-24 LAB — CBC WITH DIFFERENTIAL
BASOS: 1 %
Basophils Absolute: 0.1 10*3/uL (ref 0.0–0.2)
EOS (ABSOLUTE): 0.2 10*3/uL (ref 0.0–0.4)
EOS: 2 %
HEMATOCRIT: 39.3 % (ref 34.0–46.6)
Hemoglobin: 13.1 g/dL (ref 11.1–15.9)
Immature Grans (Abs): 0 10*3/uL (ref 0.0–0.1)
Immature Granulocytes: 1 %
LYMPHS ABS: 2.5 10*3/uL (ref 0.7–3.1)
Lymphs: 30 %
MCH: 29.3 pg (ref 26.6–33.0)
MCHC: 33.3 g/dL (ref 31.5–35.7)
MCV: 88 fL (ref 79–97)
MONOS ABS: 0.5 10*3/uL (ref 0.1–0.9)
Monocytes: 6 %
NEUTROS ABS: 4.9 10*3/uL (ref 1.4–7.0)
Neutrophils: 60 %
RBC: 4.47 x10E6/uL (ref 3.77–5.28)
RDW: 14.2 % (ref 12.3–15.4)
WBC: 8.1 10*3/uL (ref 3.4–10.8)

## 2017-04-24 LAB — COMPREHENSIVE METABOLIC PANEL
A/G RATIO: 1.4 (ref 1.2–2.2)
ALT: 16 IU/L (ref 0–32)
AST: 13 IU/L (ref 0–40)
Albumin: 4.2 g/dL (ref 3.5–5.5)
Alkaline Phosphatase: 87 IU/L (ref 39–117)
BILIRUBIN TOTAL: 0.4 mg/dL (ref 0.0–1.2)
BUN/Creatinine Ratio: 13 (ref 9–23)
BUN: 9 mg/dL (ref 6–24)
CALCIUM: 9.1 mg/dL (ref 8.7–10.2)
CHLORIDE: 100 mmol/L (ref 96–106)
CO2: 24 mmol/L (ref 20–29)
Creatinine, Ser: 0.71 mg/dL (ref 0.57–1.00)
GFR, EST AFRICAN AMERICAN: 121 mL/min/{1.73_m2} (ref >59)
GFR, EST NON AFRICAN AMERICAN: 105 mL/min/{1.73_m2} (ref >59)
GLOBULIN, TOTAL: 2.9 g/dL (ref 1.5–4.5)
Glucose: 171 mg/dL — ABNORMAL HIGH (ref 65–99)
POTASSIUM: 5.3 mmol/L — AB (ref 3.5–5.2)
SODIUM: 139 mmol/L (ref 134–144)
Total Protein: 7.1 g/dL (ref 6.0–8.5)

## 2017-04-24 LAB — T4, FREE: FREE T4: 0.95 ng/dL (ref 0.82–1.77)

## 2017-04-24 LAB — LIPID PANEL WITH LDL/HDL RATIO
CHOLESTEROL TOTAL: 234 mg/dL — AB (ref 100–199)
HDL: 38 mg/dL — ABNORMAL LOW (ref >39)
LDL CALC: 167 mg/dL — AB (ref 0–99)
LDL/HDL RATIO: 4.4 ratio — AB (ref 0.0–3.2)
Triglycerides: 147 mg/dL (ref 0–149)
VLDL CHOLESTEROL CAL: 29 mg/dL (ref 5–40)

## 2017-04-24 LAB — T3: T3 TOTAL: 119 ng/dL (ref 71–180)

## 2017-04-24 LAB — HEMOGLOBIN A1C
Est. average glucose Bld gHb Est-mCnc: 166 mg/dL
HEMOGLOBIN A1C: 7.4 % — AB (ref 4.8–5.6)

## 2017-04-24 LAB — TSH: TSH: 4.78 u[IU]/mL — AB (ref 0.450–4.500)

## 2017-04-24 LAB — FOLATE: Folate: 11.7 ng/mL (ref >3.0)

## 2017-04-24 LAB — INSULIN, RANDOM: INSULIN: 43.4 u[IU]/mL — ABNORMAL HIGH (ref 2.6–24.9)

## 2017-04-24 LAB — VITAMIN D 25 HYDROXY (VIT D DEFICIENCY, FRACTURES): Vit D, 25-Hydroxy: 19.8 ng/mL — ABNORMAL LOW (ref 30.0–100.0)

## 2017-04-24 LAB — VITAMIN B12: Vitamin B-12: 398 pg/mL (ref 232–1245)

## 2017-04-27 ENCOUNTER — Encounter: Payer: Self-pay | Admitting: Internal Medicine

## 2017-04-28 ENCOUNTER — Encounter (INDEPENDENT_AMBULATORY_CARE_PROVIDER_SITE_OTHER): Payer: Self-pay | Admitting: Family Medicine

## 2017-05-01 ENCOUNTER — Ambulatory Visit: Payer: Managed Care, Other (non HMO) | Admitting: Internal Medicine

## 2017-05-06 ENCOUNTER — Ambulatory Visit (INDEPENDENT_AMBULATORY_CARE_PROVIDER_SITE_OTHER): Payer: Managed Care, Other (non HMO) | Admitting: Family Medicine

## 2017-05-06 VITALS — BP 146/83 | HR 85 | Temp 98.8°F | Ht 69.0 in | Wt 358.0 lb

## 2017-05-06 DIAGNOSIS — Z9189 Other specified personal risk factors, not elsewhere classified: Secondary | ICD-10-CM | POA: Diagnosis not present

## 2017-05-06 DIAGNOSIS — Z6841 Body Mass Index (BMI) 40.0 and over, adult: Secondary | ICD-10-CM

## 2017-05-06 DIAGNOSIS — E1140 Type 2 diabetes mellitus with diabetic neuropathy, unspecified: Secondary | ICD-10-CM

## 2017-05-06 DIAGNOSIS — E559 Vitamin D deficiency, unspecified: Secondary | ICD-10-CM | POA: Insufficient documentation

## 2017-05-06 DIAGNOSIS — E782 Mixed hyperlipidemia: Secondary | ICD-10-CM

## 2017-05-06 MED ORDER — VITAMIN D (ERGOCALCIFEROL) 1.25 MG (50000 UNIT) PO CAPS: 50000.0000 [IU] | ORAL_CAPSULE | ORAL | 0 refills | Status: DC

## 2017-05-06 MED ORDER — METFORMIN HCL 500 MG PO TABS: 500.0000 mg | ORAL_TABLET | Freq: Every day | ORAL | 0 refills | Status: DC

## 2017-05-07 NOTE — Progress Notes (Signed)
Office: [phone]  /  Fax: [phone]   HPI:   Chief Complaint: OBESITY Katie Harris is here to discuss her progress with her obesity treatment plan. She is on the Category 3 plan + 300 calories and is following her eating plan approximately 90 % of the time. She states she is exercising 0 minutes 0 times per week. Katie Harris did well with weight loss but struggled to eat all her dinner. She had some temptations but tried to damage control. She was disappointed she didn't lose more but was advised she lost the expected and appropriate amount of weight.  Her weight is (!) 358 lb (162.4 kg) today and has had a weight loss of 4 pounds over a period of 2 weeks since her last visit. She has lost 4 lbs since starting treatment with [facility].  Vitamin D Deficiency Katie Harris has a new diagnosis of vitamin D deficiency. She notes fatigue and she is not on Vit D. She denies nausea, vomiting or muscle weakness.  Diabetes II with Neuropathy Katie Harris has a diagnosis of diabetes type II. Katie Harris's A1c increased to 7.6, she has a family history of diabetes mellitus. She noted polyphagia improved on low simple carbohydrate diet. She has been working on intensive lifestyle modifications including diet, exercise, and weight loss to help control her blood glucose levels.  Hyperlipidemia [name] has hyperlipidemia and she is not on statin, would like to diet control to improve her cholesterol levels with intensive lifestyle modification including a low saturated fat diet, exercise and weight loss. She denies any chest pain, claudication or myalgias.  At risk for cardiovascular disease Katie Harris is at a higher than average risk for cardiovascular disease due to obesity, diabetes II, and hyperlipidemia. She currently denies any chest pain.  ALLERGIES: No Known Allergies  MEDICATIONS: Current Outpatient Medications on File Prior to Visit  Medication Sig Dispense Refill  . cyclobenzaprine (FLEXERIL) 10 MG tablet Take 1 tablet (10 mg  total) by mouth 3 (three) times daily as needed for muscle spasms. (Patient taking differently: Take 10 mg by mouth at bedtime. ) 60 tablet 0  . DULoxetine (CYMBALTA) 60 MG capsule Take 60 mg by mouth daily.   6  . metoprolol (LOPRESSOR) 50 MG tablet Take 50 mg by mouth 2 (two) times daily.      No current facility-administered medications on file prior to visit.     PAST MEDICAL HISTORY: Past Medical History:  Diagnosis Date  . Anxiety   . Arthritis    lower back  . Back pain   . 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 Cancer ([place])    basal cell removed from left hairline area  . Depression   . Diabetes mellitus without complication ([facility])    recent A1C 6.5 no meds presently  . Factor 5 Leiden mutation, heterozygous (Katie [facility]) 04/21/2012   Lab 04/16/12 in view of positive Leiden in her father; no personal hx of thrombosis despite prior pregnancy & C-section 2007  . 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 HTN (hypertension), benign 04/21/2012  . Hypertension   . Hyperthyroidism    levels were abnormal in Dec- no treatment currently  . Hyperthyroidism 04/21/2012  . Infertility, female   . Leg edema   . PCOS (polycystic ovarian syndrome)   . Peripheral vascular disease (HCC)    neuropathy bilateral feet  . Prediabetes   . Sleep apnea     PAST SURGICAL HISTORY: Past Surgical History:  Procedure Laterality Date  . CESAREAN SECTION    . DILATATION & CURETTAGE/HYSTEROSCOPY WITH MYOSURE N/A 10/17/2016   Procedure: DILATATION & CURETTAGE/HYSTEROSCOPY WITH MYOSURE POLYPECTOMY;  Surgeon: Katie Compton, Harris;  Location: [facility];  Service: Gynecology;  Laterality: N/A;  myosure rep will be here confirmed on 10/06/16  . DILATION AND CURETTAGE OF UTERUS     polpectomy  . INTRAUTERINE DEVICE (IUD) INSERTION N/A 10/17/2016   Procedure: INTRAUTERINE DEVICE (IUD) INSERTION MIRENA;  Surgeon: Katie Compton, Harris;  Location: [facility];  Service: Gynecology;  Laterality: N/A;  .  TONSILLECTOMY      SOCIAL HISTORY: Social History   Tobacco Use  . Smoking status: Never Smoker  . Smokeless tobacco: Never Used  Substance Use Topics  . Alcohol use: No  . Drug use: No    FAMILY HISTORY: Family History  Problem Relation Age of Onset  . Hypertension Mother   . Hyperlipidemia Mother   . Thyroid disease Mother   . Depression Mother   . Anxiety disorder Mother   . Sleep apnea Mother   . Obesity Mother   . Heart disease Father   . Diabetes Father   . Hypertension Father   . Hyperlipidemia Father   . Obesity Father   . Diabetes Maternal Grandmother   . Hyperlipidemia Maternal Grandmother   . Hyperlipidemia Maternal Grandfather   . Hyperlipidemia Paternal Grandmother   . Diabetes Paternal Grandmother   . Cancer Paternal Grandmother   . Heart disease Paternal Grandfather     ROS: Review of Systems  Constitutional: Positive for malaise/fatigue and weight loss.  Cardiovascular: Negative for chest pain and claudication.  Gastrointestinal: Negative for nausea and vomiting.  Musculoskeletal: Negative for myalgias.       Negative muscle weakness  Endo/Heme/Allergies:       Negative polyphagia    PHYSICAL EXAM: Blood pressure (!) 146/83, pulse 85, temperature 98.8 F (37.1 C), temperature source Oral, height 5\' 9"  (1.753 m), weight (!) 358 lb (162.4 kg), SpO2 98 %. Body mass index is 52.87 kg/m. Physical Exam  Constitutional: She is oriented to person, place, and time. She appears well-developed and well-nourished.  Cardiovascular: Normal rate.  Pulmonary/Chest: Effort normal.  Musculoskeletal: Normal range of motion.  Neurological: She is oriented to person, place, and time.  Skin: Skin is warm and dry.  Psychiatric: She has a normal mood and affect. Her behavior is normal.  Vitals reviewed.   RECENT LABS AND TESTS: BMET    Component Value Date/Time   NA 139 04/23/2017 0757   K 5.3 (H) 04/23/2017 0757   CL 100 04/23/2017 0757   CO2 24  04/23/2017 0757   GLUCOSE 171 (H) 04/23/2017 0757   GLUCOSE 149 (H) 10/09/2016 0950   BUN 9 04/23/2017 0757   CREATININE 0.71 04/23/2017 0757   CALCIUM 9.1 04/23/2017 0757   GFRNONAA 105 04/23/2017 0757   GFRAA 121 04/23/2017 0757   Lab Results  Component Value Date   HGBA1C 7.4 (H) 04/23/2017   Lab Results  Component Value Date   INSULIN 43.4 (H) 04/23/2017   CBC    Component Value Date/Time   WBC 8.1 04/23/2017 0757   WBC 8.6 10/09/2016 0950   RBC 4.47 04/23/2017 0757   RBC 4.26 10/09/2016 0950   HGB 13.1 04/23/2017 0757   HCT 39.3 04/23/2017 0757   PLT 294 10/09/2016 0950   MCV 88 04/23/2017 0757   MCH 29.3 04/23/2017 0757   MCH 30.5 10/09/2016 0950   MCHC 33.3 04/23/2017 0757  MCHC 33.8 10/09/2016 0950   RDW 14.2 04/23/2017 0757   LYMPHSABS 2.5 04/23/2017 0757   MONOABS 0.5 07/09/2016 1233   EOSABS 0.2 04/23/2017 0757   BASOSABS 0.1 04/23/2017 0757   Iron/TIBC/Ferritin/ %Sat No results found for: IRON, TIBC, FERRITIN, IRONPCTSAT Lipid Panel     Component Value Date/Time   CHOL 234 (H) 04/23/2017 0757   TRIG 147 04/23/2017 0757   HDL 38 (L) 04/23/2017 0757   LDLCALC 167 (H) 04/23/2017 0757   Hepatic Function Panel     Component Value Date/Time   PROT 7.1 04/23/2017 0757   ALBUMIN 4.2 04/23/2017 0757   AST 13 04/23/2017 0757   ALT 16 04/23/2017 0757   ALKPHOS 87 04/23/2017 0757   BILITOT 0.4 04/23/2017 0757      Component Value Date/Time   TSH 4.780 (H) 04/23/2017 0757   TSH 4.260 01/06/2017 1046   TSH 2.78 07/29/2016 1655  Results for URIYAH, MASSIMO (MRN [id number]) as of 05/07/2017 07:15  Ref. Range 04/23/2017 07:57  Vitamin D, 25-Hydroxy Latest Ref Range: 30.0 - 100.0 ng/mL 19.8 (L)    ASSESSMENT AND PLAN: Vitamin D deficiency - Plan: Vitamin D, Ergocalciferol, (DRISDOL) 50000 units CAPS capsule  Type 2 diabetes mellitus with diabetic neuropathy, without long-term current use of insulin (HCC) - Plan: metFORMIN (GLUCOPHAGE) 500 MG  tablet  Mixed hyperlipidemia  At risk for heart disease  Class 3 severe obesity with serious comorbidity and body mass index (BMI) of 50.0 to 59.9 in adult, unspecified obesity type ([place])  PLAN:  Vitamin D Deficiency Durga was informed that low vitamin D levels contributes to fatigue and are associated with obesity, breast, and colon cancer. Basilia agrees to start prescription Vit D @50 ,000 IU every week #4 with no refills. She will follow up for routine testing of vitamin D, at least 2-3 times per year. She was informed of the risk of over-replacement of vitamin D and agrees to not increase her dose unless she discusses this with Katie Harris first. Catori agrees to follow up with our clinic in 2 weeks.  Diabetes II with Neuropathy Kelby has been given extensive diabetes education by myself today including ideal fasting and post-prandial blood glucose readings, individual ideal Hgb A1c goals and hypoglycemia prevention. We discussed the importance of good blood sugar control to decrease the likelihood of diabetic complications such as nephropathy, neuropathy, limb loss, blindness, coronary artery disease, and death. We discussed the importance of intensive lifestyle modification including diet, exercise and weight loss as the first line treatment for diabetes. Treanna agrees to start metformin 500 mg q AM #30 with no refills, will delay checking BGs for now as she is still upset about the diagnosis. Kallie agrees to follow up with our clinic in 2 weeks.  Hyperlipidemia Jaeden was informed of the American Heart Association Guidelines emphasizing intensive lifestyle modifications as the first line treatment for hyperlipidemia. We discussed many lifestyle modifications today in depth, and Keyuana will continue to work on decreasing saturated fats such as fatty red meat, butter and many fried foods. She will also increase vegetables and lean protein in her diet and continue to work on diet, exercise, and weight loss  efforts. We will recheck labs in 3 months and Lonie agrees to follow up with our clinic in 2 weeks.  Cardiovascular risk counselling Shaquayla was given extended (30 minutes) coronary artery disease prevention counseling today. She is 42 y.o. female and has risk factors for heart disease including obesity, diabetes II, and hyperlipidemia. We discussed  intensive lifestyle modifications today with an emphasis on specific weight loss instructions and strategies. Pt was also informed of the importance of increasing exercise and decreasing saturated fats to help prevent heart disease.  Obesity Sady is currently in the action stage of change. As such, her goal is to continue with weight loss efforts She has agreed to follow the Category 3 plan + 300 calories Keerthi has been instructed to work up to a goal of 150 minutes of combined cardio and strengthening exercise per week for weight loss and overall health benefits. We discussed the following Behavioral Modification Strategies today: increasing lean protein intake, decreasing simple carbohydrates  and dealing with family or coworker sabotage   Maisha has agreed to follow up with our clinic in 2 weeks. She was informed of the importance of frequent follow up visits to maximize her success with intensive lifestyle modifications for her multiple health conditions.   OBESITY BEHAVIORAL INTERVENTION VISIT  Today's visit was # 2 out of 22.  Starting weight: 362 lbs Starting date: 04/22/17 Today's weight : 358 lbs Today's date: 05/06/2017 Total lbs lost to date: 4 (Patients must lose 7 lbs in the first 6 months to continue with counseling)   ASK: We discussed the diagnosis of obesity with [name] today and Octa agreed to give [facility] permission to discuss obesity behavioral modification therapy today.  ASSESS: Peola has the diagnosis of obesity and her BMI today is 52.84 Merriel is in the action stage of change   ADVISE: Donnita was educated on the  multiple health risks of obesity as well as the benefit of weight loss to improve her health. She was advised of the need for long term treatment and the importance of lifestyle modifications.  AGREE: Multiple dietary modification options and treatment options were discussed and  Sayler agreed to the above obesity treatment plan.  I, Trixie Dredge, am acting as transcriptionist for Dennard Nip, Harris  I have reviewed the above documentation for accuracy and completeness, and I agree with the above. -Dennard Nip, Harris

## 2017-05-19 ENCOUNTER — Encounter: Payer: Self-pay | Admitting: Neurology

## 2017-05-19 ENCOUNTER — Ambulatory Visit: Payer: Managed Care, Other (non HMO) | Admitting: Neurology

## 2017-05-19 VITALS — BP 121/79 | HR 79 | Ht 69.0 in | Wt 362.8 lb

## 2017-05-19 DIAGNOSIS — G4733 Obstructive sleep apnea (adult) (pediatric): Secondary | ICD-10-CM | POA: Diagnosis not present

## 2017-05-19 DIAGNOSIS — E0842 Diabetes mellitus due to underlying condition with diabetic polyneuropathy: Secondary | ICD-10-CM

## 2017-05-19 DIAGNOSIS — G932 Benign intracranial hypertension: Secondary | ICD-10-CM | POA: Diagnosis not present

## 2017-05-19 NOTE — Patient Instructions (Signed)
-May consider daily alpha lipoic acid which is an antioxidant that may reduce free radical oxidative stress associated with diabetic polyneuropathy, existing evidence suggests that alpha lipoic acid significantly reduces stabbing, lancinating and burning pain and diabetic neuropathy with its onset of action as early as 1-2 weeks. 400-600 twice daily  - Gabapentin 300mg  up to 3x a day   Gabapentin capsules or tablets What is this medicine? GABAPENTIN (GA ba pen tin) is used to control partial seizures in adults with epilepsy. It is also used to treat certain types of nerve pain. This medicine may be used for other purposes; ask your health care provider or pharmacist if you have questions. COMMON BRAND NAME(S): Active-PAC with Gabapentin, Gabarone, Neurontin What should I tell my health care provider before I take this medicine? They need to know if you have any of these conditions: -kidney disease -suicidal thoughts, plans, or attempt; a previous suicide attempt by you or a family member -an unusual or allergic reaction to gabapentin, other medicines, foods, dyes, or preservatives -pregnant or trying to get pregnant -breast-feeding How should I use this medicine? Take this medicine by mouth with a glass of water. Follow the directions on the prescription label. You can take it with or without food. If it upsets your stomach, take it with food.Take your medicine at regular intervals. Do not take it more often than directed. Do not stop taking except on your doctor's advice. If you are directed to break the 600 or 800 mg tablets in half as part of your dose, the extra half tablet should be used for the next dose. If you have not used the extra half tablet within 28 days, it should be thrown away. A special MedGuide will be given to you by the pharmacist with each prescription and refill. Be sure to read this information carefully each time. Talk to your pediatrician regarding the use of this  medicine in children. Special care may be needed. Overdosage: If you think you have taken too much of this medicine contact a poison control center or emergency room at once. NOTE: This medicine is only for you. Do not share this medicine with others. What if I miss a dose? If you miss a dose, take it as soon as you can. If it is almost time for your next dose, take only that dose. Do not take double or extra doses. What may interact with this medicine? Do not take this medicine with any of the following medications: -other gabapentin products This medicine may also interact with the following medications: -alcohol -antacids -antihistamines for allergy, cough and cold -certain medicines for anxiety or sleep -certain medicines for depression or psychotic disturbances -homatropine; hydrocodone -naproxen -narcotic medicines (opiates) for pain -phenothiazines like chlorpromazine, mesoridazine, prochlorperazine, thioridazine This list may not describe all possible interactions. Give your health care provider a list of all the medicines, herbs, non-prescription drugs, or dietary supplements you use. Also tell them if you smoke, drink alcohol, or use illegal drugs. Some items may interact with your medicine. What should I watch for while using this medicine? Visit your doctor or health care professional for regular checks on your progress. You may want to keep a record at home of how you feel your condition is responding to treatment. You may want to share this information with your doctor or health care professional at each visit. You should contact your doctor or health care professional if your seizures get worse or if you have any new types of  seizures. Do not stop taking this medicine or any of your seizure medicines unless instructed by your doctor or health care professional. Stopping your medicine suddenly can increase your seizures or their severity. Wear a medical identification bracelet or  chain if you are taking this medicine for seizures, and carry a card that lists all your medications. You may get drowsy, dizzy, or have blurred vision. Do not drive, use machinery, or do anything that needs mental alertness until you know how this medicine affects you. To reduce dizzy or fainting spells, do not sit or stand up quickly, especially if you are an older patient. Alcohol can increase drowsiness and dizziness. Avoid alcoholic drinks. Your mouth may get dry. Chewing sugarless gum or sucking hard candy, and drinking plenty of water will help. The use of this medicine may increase the chance of suicidal thoughts or actions. Pay special attention to how you are responding while on this medicine. Any worsening of mood, or thoughts of suicide or dying should be reported to your health care professional right away. Women who become pregnant while using this medicine may enroll in the [place] Pregnancy Registry by calling [phone]. This registry collects information about the safety of antiepileptic drug use during pregnancy. What side effects may I notice from receiving this medicine? Side effects that you should report to your doctor or health care professional as soon as possible: -allergic reactions like skin rash, itching or hives, swelling of the face, lips, or tongue -worsening of mood, thoughts or actions of suicide or dying Side effects that usually do not require medical attention (report to your doctor or health care professional if they continue or are bothersome): -constipation -difficulty walking or controlling muscle movements -dizziness -nausea -slurred speech -tiredness -tremors -weight gain This list may not describe all possible side effects. Call your doctor for medical advice about side effects. You may report side effects to FDA at 1-800-FDA-1088. Where should I keep my medicine? Keep out of reach of children. This medicine may cause  accidental overdose and death if it taken by other adults, children, or pets. Mix any unused medicine with a substance like cat litter or coffee grounds. Then throw the medicine away in a sealed container like a sealed bag or a coffee can with a lid. Do not use the medicine after the expiration date. Store at room temperature between 15 and 30 degrees C (59 and 86 degrees F). NOTE: This sheet is a summary. It may not cover all possible information. If you have questions about this medicine, talk to your doctor, pharmacist, or health care provider.  2018 Elsevier/Gold Standard (2013-04-22 15:26:50)

## 2017-05-19 NOTE — Progress Notes (Signed)
[facility]    Provider:  Dr Jaynee Eagles Referring Provider: Aretta Nip, MD Primary Care Physician:  Aretta Nip, MD  CC:  Pseudotumor cerebri and peripheral neuropathy  Interval history 05/19/2017: Patient is here for follow-up today for peripheral neuropathy and idiopathic intracranial hypertension, morbid obesity, idiopathic intracranial hypertension.  she has a past medical history of hypertension.  discussed results of thorough workup including ophthalmology, sleep study results which showed she did have sleep apnea and CPAP will be initiated, referred her to the [facility] for her BMI of 55 and completed a thorough evaluation for peripheral neuropathy which showed diabetes.  Patient has since followed up with her primary care and started on metformin.  She declines Topamax or Diamox or any treatment for her IH despite risks of blindness and daily headaches.  She would like to try the CPAP first to see if that will help her headaches.  Personally reviewed MRI of the brain, MRI of the orbits and MRV of the head images and also reviewed them with patient they were unremarkable did not show any signs of intracranial hypertension which is quite reassuring.  HPI:  Katie Harris is a 42 y.o. female here as a referral from Dr. Radene Ou for peripheral neuropathy and idiopathic intracranial hypertension. Past medical history of high blood pressure, intracranial hypertension, thyrotoxicosis. Was diagnosed with IIH in 1994. She was treated by the [facility] by Dr. Earley Favor. She has headaches, every day continuously. She has seen multiple neurologists, Dr. Earley Favor and Dr. Melton Alar. She says no one can help her. She has had back pain since her last LP. She declines LPs. Headache has been stable for years has not changed wince 1994. She has pressure behind the eyes and in the temples. She has light and sound sensitivity. She doesn't remember what  she took and nothing helped. She can't remember. She wakes up with headaches. She saw ophthalmology, neuropathy in the feet started in the balls of her feet, started 6-7 years ago, progressive, hgba1c at the time was prediabetic. No other focal neurologic deficits, associated symptoms, inciting events or modifiable factors.  Reviewed notes, labs and imaging from outside physicians, which showed:  Patient was seen by Dr. Maxwell Caul and had early stages neuropathy years ago with fever numbs and hands are beginning to become numb. Hydrocodone. At appointment only her right thumb was numb. He was diagnosed with pseudotumor cerebri but does not take medications. Has a history of thyrotoxicosis. Takes duloxetine and Flexeril for pain.  B12 is 938. Hemoglobin A1c 6.5.  Review of Systems: Patient complains of symptoms per HPI as well as the following symptoms: headache. Pertinent negatives and positives per HPI. All others negative.   Social History   Socioeconomic History  . Marital status: Married    Spouse name: Lariyah Shetterly  . Number of children: 1  . Years of education: Not on file  . Highest education level: Some college, no degree  Social Needs  . Financial resource strain: Not on file  . Food insecurity - worry: Not on file  . Food insecurity - inability: Not on file  . Transportation needs - medical: Not on file  . Transportation needs - non-medical: Not on file  Occupational History  . Occupation: Secretary/administrator  Tobacco Use  . Smoking status: Never Smoker  . Smokeless tobacco: Never Used  Substance and Sexual Activity  . Alcohol use: No  . Drug use: No  . Sexual activity: Yes  Birth control/protection: None  Other Topics Concern  . Not on file  Social History Narrative   Lives at home with her husband, child and dog    Right handed   No caffeine       Family History  Problem Relation Age of Onset  . Hypertension Mother   . Hyperlipidemia Mother   . Thyroid  disease Mother   . Depression Mother   . Anxiety disorder Mother   . Sleep apnea Mother   . Obesity Mother   . Heart disease Father   . Diabetes Father   . Hypertension Father   . Hyperlipidemia Father   . Obesity Father   . Diabetes Maternal Grandmother   . Hyperlipidemia Maternal Grandmother   . Hyperlipidemia Maternal Grandfather   . Hyperlipidemia Paternal Grandmother   . Diabetes Paternal Grandmother   . Cancer Paternal Grandmother   . Heart disease Paternal Grandfather     Past Medical History:  Diagnosis Date  . Anxiety   . Arthritis    lower back  . Back pain   . 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 Cancer ([place])    basal cell removed from left hairline area  . Depression   . Diabetes mellitus without complication ([place])    recent A1C 6.5 no meds presently  . Factor 5 Leiden mutation, heterozygous ([place]) 04/21/2012   Lab 04/16/12 in view of positive Leiden in her father; no personal hx of thrombosis despite prior pregnancy & C-section 2007  . 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 HTN (hypertension), benign 04/21/2012  . Hypertension   . Hyperthyroidism    levels were abnormal in Dec- no treatment currently  . Hyperthyroidism 04/21/2012  . Infertility, female   . Leg edema   . PCOS (polycystic ovarian syndrome)   . Peripheral vascular disease (HCC)    neuropathy bilateral feet  . Prediabetes   . Sleep apnea     Past Surgical History:  Procedure Laterality Date  . CESAREAN SECTION    . DILATATION & CURETTAGE/HYSTEROSCOPY WITH MYOSURE N/A 10/17/2016   Procedure: DILATATION & CURETTAGE/HYSTEROSCOPY WITH MYOSURE POLYPECTOMY;  Surgeon: Paula Compton, MD;  Location: [facility];  Service: Gynecology;  Laterality: N/A;  myosure rep will be here confirmed on 10/06/16  . DILATION AND CURETTAGE OF UTERUS     polpectomy  . INTRAUTERINE DEVICE (IUD) INSERTION N/A 10/17/2016   Procedure: INTRAUTERINE DEVICE (IUD) INSERTION MIRENA;  Surgeon:  Paula Compton, MD;  Location: [facility];  Service: Gynecology;  Laterality: N/A;  . TONSILLECTOMY      Current Outpatient Medications  Medication Sig Dispense Refill  . cyclobenzaprine (FLEXERIL) 10 MG tablet Take 1 tablet (10 mg total) by mouth 3 (three) times daily as needed for muscle spasms. (Patient taking differently: Take 10 mg by mouth at bedtime. ) 60 tablet 0  . DULoxetine (CYMBALTA) 60 MG capsule Take 60 mg by mouth daily.   6  . metoprolol (LOPRESSOR) 50 MG tablet Take 50 mg by mouth 2 (two) times daily.     Marland Kitchen gabapentin (NEURONTIN) 300 MG capsule Take 1 capsule (300 mg total) by mouth 3 (three) times daily. 90 capsule 5  . metFORMIN (GLUCOPHAGE) 500 MG tablet Take 1 tablet (500 mg total) by mouth daily with breakfast. 30 tablet 0  . Vitamin D, Ergocalciferol, (DRISDOL) 50000 units CAPS capsule Take 1 capsule (50,000 Units total) by mouth every 7 (seven) days. 4 capsule 0  No current facility-administered medications for this visit.     Allergies as of 05/19/2017  . (No Known Allergies)    Vitals: BP 121/79 (BP Location: Right Arm, Patient Position: Sitting)   Pulse 79   Ht 5\' 9"  (1.753 m)   Wt (!) 362 lb 12.8 oz (164.6 kg)   BMI 53.58 kg/m  Last Weight:  Wt Readings from Last 1 Encounters:  05/21/17 (!) 357 lb (161.9 kg)   Last Height:   Ht Readings from Last 1 Encounters:  05/21/17 5\' 9"  (1.753 m)    Physical exam: Exam: Gen: NAD, conversant, well nourised, morbidly obese            CV: RRR, no MRG. No Carotid Bruits. No peripheral edema, warm, nontender Eyes: Conjunctivae clear without exudates or hemorrhage  Neuro: Detailed Neurologic Exam  Speech:    Speech is normal; fluent and spontaneous with normal comprehension.  Cognition:    The patient is oriented to person, place, and time;     recent and remote memory intact;     language fluent;     normal attention, concentration,     fund of knowledge Cranial Nerves:    The pupils are equal, round,  and reactive to light. Blurred margins.. Visual fields are full to finger confrontation. Extraocular movements are intact. Trigeminal sensation is intact and the muscles of mastication are normal. The face is symmetric. The palate elevates in the midline. Hearing intact. Voice is normal. Shoulder shrug is normal. The tongue has normal motion without fasciculations.   Coordination:    No dysmetria  Gait:    Wide based due to extremely large body habitus  Motor Observation:    No asymmetry, no atrophy, and no involuntary movements noted. Tone:    Normal muscle tone.    Posture:    Posture is normal. normal erect    Strength:    Strength is V/V in the upper and lower limbs.      Sensation: Decreased pinprick and temperature distally in the lower extremities in a gradient fashion, intact proprioception and vibration     Reflex Exam:  DTR's:    Deep tendon reflexes in the upper and lower extremities are normal bilaterally.   Toes:    The toes are downgoing bilaterally.   Clonus:    Clonus is absent.      Assessment/Plan:  This is a morbidly obese female with a past medical history of idiopathic peripheral polyneuropathy and Idiopathic Intracranial HTN (IIH) that has been untreated for multiple years.   - Ophthalmology referral to evaluate visual fields and a fully dilated exam due to long-standing untreated IIH: she saw Dr. Katy Fitch - Sleep study due to morbid obesity(BMI 55), continuous chronic headache, snoring, morning headache: Confirmed sleep apnea in the process of getting a cpap - Discussed medication overuse again today and not to take any types of analgesic medications or over-the-counter acute headache medications more than 10 times in a month. - For her Morbid obesity. [facility]. BMI 55: she was referred at last appointment, she says she will go  - Idiopathic peripheral neuropathy: Progressive, risk factors include morbid obesity, PVD, DM, thyroid  disease. Start neurontin. -MRI of the orbits and MRV of the head images and also reviewed them with patient they were unremarkable did not show any signs of intracranial hypertension which is quite reassuring.  Discussed: To prevent or relieve headaches, try the following: Cool Compress. Lie down and place a cool compress  on your head.  Avoid headache triggers. If certain foods or odors seem to have triggered your migraines in the past, avoid them. A headache diary might help you identify triggers.  Include physical activity in your daily routine. Try a daily walk or other moderate aerobic exercise.  Manage stress. Find healthy ways to cope with the stressors, such as delegating tasks on your to-do list.  Practice relaxation techniques. Try deep breathing, yoga, massage and visualization.  Eat regularly. Eating regularly scheduled meals and maintaining a healthy diet might help prevent headaches. Also, drink plenty of fluids.  Follow a regular sleep schedule. Sleep deprivation might contribute to headaches Consider biofeedback. With this mind-body technique, you learn to control certain bodily functions - such as muscle tension, heart rate and blood pressure - to prevent headaches or reduce headache pain.    Proceed to emergency room if you experience new or worsening symptoms or symptoms do not resolve, if you have new neurologic symptoms or if headache is severe, or for any concerning symptom.      Cc: Dr. Radene Ou  A total of 30 minutes was spent in with this patient face-to-face. Over half this time was spent on counseling patient on the IIH, morbid obesity, neuropathy diagnosis and different therapeutic options available.    Sarina Ill, MD  [facility] [address] [place], [place] [address]  Phone [phone] Fax [phone]

## 2017-05-20 ENCOUNTER — Ambulatory Visit (INDEPENDENT_AMBULATORY_CARE_PROVIDER_SITE_OTHER): Payer: Managed Care, Other (non HMO) | Admitting: Family Medicine

## 2017-05-20 MED ORDER — GABAPENTIN 300 MG PO CAPS: 300.0000 mg | ORAL_CAPSULE | Freq: Three times a day (TID) | ORAL | 5 refills | Status: DC

## 2017-05-21 ENCOUNTER — Ambulatory Visit (INDEPENDENT_AMBULATORY_CARE_PROVIDER_SITE_OTHER): Payer: Managed Care, Other (non HMO) | Admitting: Family Medicine

## 2017-05-21 VITALS — BP 131/82 | HR 98 | Temp 98.8°F | Ht 69.0 in | Wt 357.0 lb

## 2017-05-21 DIAGNOSIS — E119 Type 2 diabetes mellitus without complications: Secondary | ICD-10-CM | POA: Diagnosis not present

## 2017-05-21 DIAGNOSIS — Z9189 Other specified personal risk factors, not elsewhere classified: Secondary | ICD-10-CM | POA: Diagnosis not present

## 2017-05-21 DIAGNOSIS — E559 Vitamin D deficiency, unspecified: Secondary | ICD-10-CM | POA: Diagnosis not present

## 2017-05-21 DIAGNOSIS — Z6841 Body Mass Index (BMI) 40.0 and over, adult: Secondary | ICD-10-CM

## 2017-05-21 MED ORDER — VITAMIN D (ERGOCALCIFEROL) 1.25 MG (50000 UNIT) PO CAPS: 50000.0000 [IU] | ORAL_CAPSULE | ORAL | 0 refills | Status: DC

## 2017-05-21 MED ORDER — METFORMIN HCL 500 MG PO TABS: 500.0000 mg | ORAL_TABLET | Freq: Every day | ORAL | 0 refills | Status: DC

## 2017-05-25 NOTE — Progress Notes (Signed)
Office: [phone]  /  Fax: [phone]   HPI:   Chief Complaint: OBESITY Katie Harris is here to discuss her progress with her obesity treatment plan. She is on the Category 3 plan +300 calories and is following her eating plan approximately 80 to 85 % of the time. She states she is exercising 0 minutes 0 times per week. Katie Harris is struggling to follow the plan, especially on weekends and at dinner. She continues to lose weight slowly, mostly due to her weekend deviations. Her weight is (!) 357 lb (161.9 kg) today and has had a weight loss of 1 pound over a period of 2 weeks since her last visit. She has lost 5 lbs since starting treatment with [name].  Vitamin D deficiency Katie Harris has a diagnosis of vitamin D deficiency. She is stable on vit D and denies nausea, vomiting or muscle weakness. There is no change in fatigue.  Diabetes II Katie Harris has a diagnosis of diabetes type II. Katie Harris started metformin and had significant nausea and diarrhea. This has improved, mostly approximately 90%. Katie Harris denies any hypoglycemic episodes. She has been working on intensive lifestyle modifications including diet, exercise, and weight loss to help control her blood glucose levels.  At risk for cardiovascular disease Katie Harris is at a higher than average risk for cardiovascular disease due to obesity and diabetes. She currently denies any chest pain.  ALLERGIES: No Known Allergies  MEDICATIONS: Current Outpatient Medications on File Prior to Visit  Medication Sig Dispense Refill  . cyclobenzaprine (FLEXERIL) 10 MG tablet Take 1 tablet (10 mg total) by mouth 3 (three) times daily as needed for muscle spasms. (Patient taking differently: Take 10 mg by mouth at bedtime. ) 60 tablet 0  . DULoxetine (CYMBALTA) 60 MG capsule Take 60 mg by mouth daily.   6  . gabapentin (NEURONTIN) 300 MG capsule Take 1 capsule (300 mg total) by mouth 3 (three) times daily. 90 capsule 5  . metoprolol (LOPRESSOR) 50 MG tablet Take 50 mg by  mouth 2 (two) times daily.      No current facility-administered medications on file prior to visit.     PAST MEDICAL HISTORY: Past Medical History:  Diagnosis Date  . Anxiety   . Arthritis    lower back  . Back pain   . 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 Cancer ([place])    basal cell removed from left hairline area  . Depression   . Diabetes mellitus without complication ([place])    recent A1C 6.5 no meds presently  . Factor 5 Leiden mutation, heterozygous ([place]) 04/21/2012   Lab 04/16/12 in view of positive Leiden in her father; no personal hx of thrombosis despite prior pregnancy & C-section 2007  . 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 HTN (hypertension), benign 04/21/2012  . Hypertension   . Hyperthyroidism    levels were abnormal in Dec- no treatment currently  . Hyperthyroidism 04/21/2012  . Infertility, female   . Leg edema   . PCOS (polycystic ovarian syndrome)   . Peripheral vascular disease (HCC)    neuropathy bilateral feet  . Prediabetes   . Sleep apnea     PAST SURGICAL HISTORY: Past Surgical History:  Procedure Laterality Date  . CESAREAN SECTION    . DILATATION & CURETTAGE/HYSTEROSCOPY WITH MYOSURE N/A 10/17/2016   Procedure: DILATATION & CURETTAGE/HYSTEROSCOPY WITH MYOSURE POLYPECTOMY;  Surgeon: Paula Compton, MD;  Location: [facility];  Service: Gynecology;  Laterality:  N/A;  myosure rep will be here confirmed on 10/06/16  . DILATION AND CURETTAGE OF UTERUS     polpectomy  . INTRAUTERINE DEVICE (IUD) INSERTION N/A 10/17/2016   Procedure: INTRAUTERINE DEVICE (IUD) INSERTION MIRENA;  Surgeon: Paula Compton, MD;  Location: [facility];  Service: Gynecology;  Laterality: N/A;  . TONSILLECTOMY      SOCIAL HISTORY: Social History   Tobacco Use  . Smoking status: Never Smoker  . Smokeless tobacco: Never Used  Substance Use Topics  . Alcohol use: No  . Drug use: No    FAMILY HISTORY: Family History  Problem Relation Age of Onset    . Hypertension Mother   . Hyperlipidemia Mother   . Thyroid disease Mother   . Depression Mother   . Anxiety disorder Mother   . Sleep apnea Mother   . Obesity Mother   . Heart disease Father   . Diabetes Father   . Hypertension Father   . Hyperlipidemia Father   . Obesity Father   . Diabetes Maternal Grandmother   . Hyperlipidemia Maternal Grandmother   . Hyperlipidemia Maternal Grandfather   . Hyperlipidemia Paternal Grandmother   . Diabetes Paternal Grandmother   . Cancer Paternal Grandmother   . Heart disease Paternal Grandfather     ROS: Review of Systems  Constitutional: Positive for malaise/fatigue and weight loss.  Cardiovascular: Negative for chest pain.  Gastrointestinal: Positive for diarrhea and nausea. Negative for vomiting.  Musculoskeletal:       Negative for muscle weakness    PHYSICAL EXAM: Blood pressure 131/82, pulse 98, temperature 98.8 F (37.1 C), temperature source Oral, height 5\' 9"  (1.753 m), weight (!) 357 lb (161.9 kg), SpO2 98 %. Body mass index is 52.72 kg/m. Physical Exam  Constitutional: She is oriented to person, place, and time. She appears well-developed and well-nourished.  Cardiovascular: Normal rate.  Pulmonary/Chest: Effort normal.  Musculoskeletal: Normal range of motion.  Neurological: She is oriented to person, place, and time.  Skin: Skin is warm and dry.  Psychiatric: She has a normal mood and affect. Her behavior is normal.  Vitals reviewed.   RECENT LABS AND TESTS: BMET    Component Value Date/Time   NA 139 04/23/2017 0757   K 5.3 (H) 04/23/2017 0757   CL 100 04/23/2017 0757   CO2 24 04/23/2017 0757   GLUCOSE 171 (H) 04/23/2017 0757   GLUCOSE 149 (H) 10/09/2016 0950   BUN 9 04/23/2017 0757   CREATININE 0.71 04/23/2017 0757   CALCIUM 9.1 04/23/2017 0757   GFRNONAA 105 04/23/2017 0757   GFRAA 121 04/23/2017 0757   Lab Results  Component Value Date   HGBA1C 7.4 (H) 04/23/2017   Lab Results  Component Value  Date   INSULIN 43.4 (H) 04/23/2017   CBC    Component Value Date/Time   WBC 8.1 04/23/2017 0757   WBC 8.6 10/09/2016 0950   RBC 4.47 04/23/2017 0757   RBC 4.26 10/09/2016 0950   HGB 13.1 04/23/2017 0757   HCT 39.3 04/23/2017 0757   PLT 294 10/09/2016 0950   MCV 88 04/23/2017 0757   MCH 29.3 04/23/2017 0757   MCH 30.5 10/09/2016 0950   MCHC 33.3 04/23/2017 0757   MCHC 33.8 10/09/2016 0950   RDW 14.2 04/23/2017 0757   LYMPHSABS 2.5 04/23/2017 0757   MONOABS 0.5 07/09/2016 1233   EOSABS 0.2 04/23/2017 0757   BASOSABS 0.1 04/23/2017 0757   Iron/TIBC/Ferritin/ %Sat No results found for: IRON, TIBC, FERRITIN, IRONPCTSAT Lipid Panel  Component Value Date/Time   CHOL 234 (H) 04/23/2017 0757   TRIG 147 04/23/2017 0757   HDL 38 (L) 04/23/2017 0757   LDLCALC 167 (H) 04/23/2017 0757   Hepatic Function Panel     Component Value Date/Time   PROT 7.1 04/23/2017 0757   ALBUMIN 4.2 04/23/2017 0757   AST 13 04/23/2017 0757   ALT 16 04/23/2017 0757   ALKPHOS 87 04/23/2017 0757   BILITOT 0.4 04/23/2017 0757      Component Value Date/Time   TSH 4.780 (H) 04/23/2017 0757   TSH 4.260 01/06/2017 1046   TSH 2.78 07/29/2016 1655    Results for SHAWNI, VOLKOV (MRN [id number]) as of 05/25/2017 09:15  Ref. Range 04/23/2017 07:57  Vitamin D, 25-Hydroxy Latest Ref Range: 30.0 - 100.0 ng/mL 19.8 (L)   ASSESSMENT AND PLAN: Type 2 diabetes mellitus without complication, without long-term current use of insulin (HCC) - Plan: metFORMIN (GLUCOPHAGE) 500 MG tablet  Vitamin D deficiency - Plan: Vitamin D, Ergocalciferol, (DRISDOL) 50000 units CAPS capsule  At risk for heart disease  Class 3 severe obesity with serious comorbidity and body mass index (BMI) of 50.0 to 59.9 in adult, unspecified obesity type ([place])  PLAN:  Vitamin D Deficiency Katie Harris was informed that low vitamin D levels contributes to fatigue and are associated with obesity, breast, and colon cancer. She agrees to continue  to take prescription Vit D @50 ,000 IU every week #4 with no refills. We will recheck labs in 2 months and she will follow up for routine testing of vitamin D, at least 2-3 times per year. She was informed of the risk of over-replacement of vitamin D and agrees to not increase her dose unless she discusses this with [name] first. Katie Harris agrees to follow up with our clinic in 3 weeks  Diabetes II Katie Harris has been given extensive diabetes education by myself today including ideal fasting and post-prandial blood glucose readings, individual ideal Hgb A1c goals and hypoglycemia prevention. We discussed the importance of good blood sugar control to decrease the likelihood of diabetic complications such as nephropathy, neuropathy, limb loss, blindness, coronary artery disease, and death. We discussed the importance of intensive lifestyle modification including diet, exercise and weight loss as the first line treatment for diabetes. We will recheck labs in 2 months. Katie Harris agrees to continue metformin 500 mg qd #30 with no refills and follow up at the agreed upon time.  Cardiovascular risk counseling Katie Harris was given extended (15 minutes) coronary artery disease prevention counseling today. She is 42 y.o. female and has risk factors for heart disease including obesity and diabetes. We discussed intensive lifestyle modifications today with an emphasis on specific weight loss instructions and strategies. Pt was also informed of the importance of increasing exercise and decreasing saturated fats to help prevent heart disease.  Obesity Katie Harris is currently in the action stage of change. As such, her goal is to continue with weight loss efforts She has agreed to follow the Category 3 plan+300 calorie and additional breakfast options Katie Harris has been instructed to work up to a goal of 150 minutes of combined cardio and strengthening exercise per week for weight loss and overall health benefits. We discussed the following  Behavioral Modification Strategies today: no skipping meals, better snacking choices, increasing lean protein intake, decrease eating out and work on meal planning and easy cooking plans  Mila has agreed to follow up with our clinic in 3 weeks. She was informed of the importance of frequent follow up visits  to maximize her success with intensive lifestyle modifications for her multiple health conditions.   OBESITY BEHAVIORAL INTERVENTION VISIT  Today's visit was # 3 out of 22.  Starting weight: 362 lbs Starting date: 04/22/17 Today's weight : 357 lbs Today's date: 05/21/2017 Total lbs lost to date: 5 (Patients must lose 7 lbs in the first 6 months to continue with counseling)   ASK: We discussed the diagnosis of obesity with [name] today and Tameca agreed to give [name] permission to discuss obesity behavioral modification therapy today.  ASSESS: Kima has the diagnosis of obesity and her BMI today is 52.7 Scarlette is in the action stage of change   ADVISE: Juanda was educated on the multiple health risks of obesity as well as the benefit of weight loss to improve her health. She was advised of the need for long term treatment and the importance of lifestyle modifications.  AGREE: Multiple dietary modification options and treatment options were discussed and  Eyvette agreed to the above obesity treatment plan.  I, Doreene Nest, am acting as transcriptionist for Dennard Nip, MD  I have reviewed the above documentation for accuracy and completeness, and I agree with the above. -Dennard Nip, MD

## 2017-06-11 ENCOUNTER — Ambulatory Visit (INDEPENDENT_AMBULATORY_CARE_PROVIDER_SITE_OTHER): Payer: Managed Care, Other (non HMO) | Admitting: Family Medicine

## 2017-06-11 VITALS — BP 108/60 | HR 70 | Temp 98.7°F | Ht 69.0 in | Wt 355.0 lb

## 2017-06-11 DIAGNOSIS — Z6841 Body Mass Index (BMI) 40.0 and over, adult: Secondary | ICD-10-CM

## 2017-06-11 DIAGNOSIS — E119 Type 2 diabetes mellitus without complications: Secondary | ICD-10-CM

## 2017-06-15 NOTE — Progress Notes (Signed)
Office: [phone]  /  Fax: [phone]   HPI:   Chief Complaint: OBESITY Katie Harris is here to discuss her progress with her obesity treatment plan. She is on the Category 3 plan and is following her eating plan approximately 85 % of the time. She states she is exercising 0 minutes 0 times per week. Katie Harris has had increased celebration and social eating as well as increased stress with husband's work accident, but she is still able to lose weight. Her weight is (!) 355 lb (161 kg) today and has had a weight loss of 2 pounds over a period of 3 weeks since her last visit. She has lost 7 lbs since starting treatment with [name].  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 Katie Harris is not checking her blood sugar and denies any hypoglycemic episodes. She is stable on metformin and is doing well on her diet prescription. She has been working on intensive lifestyle modifications including diet, exercise, and weight loss to help control her blood glucose levels.  ALLERGIES: No Known Allergies  MEDICATIONS: Current Outpatient Medications on File Prior to Visit  Medication Sig Dispense Refill  . cyclobenzaprine (FLEXERIL) 10 MG tablet Take 1 tablet (10 mg total) by mouth 3 (three) times daily as needed for muscle spasms. (Patient taking differently: Take 10 mg by mouth at bedtime. ) 60 tablet 0  . DULoxetine (CYMBALTA) 60 MG capsule Take 60 mg by mouth daily.   6  . gabapentin (NEURONTIN) 300 MG capsule Take 1 capsule (300 mg total) by mouth 3 (three) times daily. 90 capsule 5  . metFORMIN (GLUCOPHAGE) 500 MG tablet Take 1 tablet (500 mg total) by mouth daily with breakfast. 30 tablet 0  . metoprolol (LOPRESSOR) 50 MG tablet Take 50 mg by mouth 2 (two) times daily.     . Vitamin D, Ergocalciferol, (DRISDOL) 50000 units CAPS capsule Take 1 capsule (50,000 Units total) by mouth every 7 (seven) days. 4 capsule 0   No current facility-administered medications on file prior to visit.     PAST  MEDICAL HISTORY: Past Medical History:  Diagnosis Date  . Anxiety   . Arthritis    lower back  . Back pain   . 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 Cancer ([place])    basal cell removed from left hairline area  . Depression   . Katie mellitus without complication ([place])    recent A1C 6.5 no meds presently  . Factor 5 Leiden mutation, heterozygous ([place]) 04/21/2012   Lab 04/16/12 in view of positive Leiden in her father; no personal hx of thrombosis despite prior pregnancy & C-section 2007  . 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 HTN (hypertension), benign 04/21/2012  . Hypertension   . Hyperthyroidism    levels were abnormal in Dec- no treatment currently  . Hyperthyroidism 04/21/2012  . Infertility, female   . Leg edema   . PCOS (polycystic ovarian syndrome)   . Peripheral vascular disease (HCC)    neuropathy bilateral feet  . Prediabetes   . Sleep apnea     PAST SURGICAL HISTORY: Past Surgical History:  Procedure Laterality Date  . CESAREAN SECTION    . DILATATION & CURETTAGE/HYSTEROSCOPY WITH MYOSURE N/A 10/17/2016   Procedure: DILATATION & CURETTAGE/HYSTEROSCOPY WITH MYOSURE POLYPECTOMY;  Surgeon: Paula Compton, MD;  Location: [facility];  Service: Gynecology;  Laterality: N/A;  myosure rep will be here confirmed on 10/06/16  .  DILATION AND CURETTAGE OF UTERUS     polpectomy  . INTRAUTERINE DEVICE (IUD) INSERTION N/A 10/17/2016   Procedure: INTRAUTERINE DEVICE (IUD) INSERTION MIRENA;  Surgeon: Paula Compton, MD;  Location: [facility];  Service: Gynecology;  Laterality: N/A;  . TONSILLECTOMY      SOCIAL HISTORY: Social History   Tobacco Use  . Smoking status: Never Smoker  . Smokeless tobacco: Never Used  Substance Use Topics  . Alcohol use: No  . Drug use: No    FAMILY HISTORY: Family History  Problem Relation Age of Onset  . Hypertension Mother   . Hyperlipidemia Mother   . Thyroid disease Mother   . Depression Mother   . Anxiety  disorder Mother   . Sleep apnea Mother   . Obesity Mother   . Heart disease Father   . Katie Father   . Hypertension Father   . Hyperlipidemia Father   . Obesity Father   . Katie Maternal Grandmother   . Hyperlipidemia Maternal Grandmother   . Hyperlipidemia Maternal Grandfather   . Hyperlipidemia Paternal Grandmother   . Katie Paternal Grandmother   . Cancer Paternal Grandmother   . Heart disease Paternal Grandfather     ROS: Review of Systems  Constitutional: Positive for weight loss.  Endo/Heme/Allergies:       Negative for hypoglycemia    PHYSICAL EXAM: Blood pressure 108/60, pulse 70, temperature 98.7 F (37.1 C), temperature source Oral, height 5\' 9"  (1.753 m), weight (!) 355 lb (161 kg), SpO2 98 %. Body mass index is 52.42 kg/m. Physical Exam  Constitutional: She is oriented to person, place, and time. She appears well-developed and well-nourished.  Cardiovascular: Normal rate.  Pulmonary/Chest: Effort normal.  Musculoskeletal: Normal range of motion.  Neurological: She is oriented to person, place, and time.  Skin: Skin is warm and dry.  Psychiatric: She has a normal mood and affect. Her behavior is normal.  Vitals reviewed.   RECENT LABS AND TESTS: BMET    Component Value Date/Time   NA 139 04/23/2017 0757   K 5.3 (H) 04/23/2017 0757   CL 100 04/23/2017 0757   CO2 24 04/23/2017 0757   GLUCOSE 171 (H) 04/23/2017 0757   GLUCOSE 149 (H) 10/09/2016 0950   BUN 9 04/23/2017 0757   CREATININE 0.71 04/23/2017 0757   CALCIUM 9.1 04/23/2017 0757   GFRNONAA 105 04/23/2017 0757   GFRAA 121 04/23/2017 0757   Lab Results  Component Value Date   HGBA1C 7.4 (H) 04/23/2017   Lab Results  Component Value Date   INSULIN 43.4 (H) 04/23/2017   CBC    Component Value Date/Time   WBC 8.1 04/23/2017 0757   WBC 8.6 10/09/2016 0950   RBC 4.47 04/23/2017 0757   RBC 4.26 10/09/2016 0950   HGB 13.1 04/23/2017 0757   HCT 39.3 04/23/2017 0757   PLT 294  10/09/2016 0950   MCV 88 04/23/2017 0757   MCH 29.3 04/23/2017 0757   MCH 30.5 10/09/2016 0950   MCHC 33.3 04/23/2017 0757   MCHC 33.8 10/09/2016 0950   RDW 14.2 04/23/2017 0757   LYMPHSABS 2.5 04/23/2017 0757   MONOABS 0.5 07/09/2016 1233   EOSABS 0.2 04/23/2017 0757   BASOSABS 0.1 04/23/2017 0757   Iron/TIBC/Ferritin/ %Sat No results found for: IRON, TIBC, FERRITIN, IRONPCTSAT Lipid Panel     Component Value Date/Time   CHOL 234 (H) 04/23/2017 0757   TRIG 147 04/23/2017 0757   HDL 38 (L) 04/23/2017 0757   LDLCALC 167 (H) 04/23/2017 0757  Hepatic Function Panel     Component Value Date/Time   PROT 7.1 04/23/2017 0757   ALBUMIN 4.2 04/23/2017 0757   AST 13 04/23/2017 0757   ALT 16 04/23/2017 0757   ALKPHOS 87 04/23/2017 0757   BILITOT 0.4 04/23/2017 0757      Component Value Date/Time   TSH 4.780 (H) 04/23/2017 0757   TSH 4.260 01/06/2017 1046   TSH 2.78 07/29/2016 1655   Results for PARRIS, SIGNER (MRN [id number]) as of 06/15/2017 07:54  Ref. Range 04/23/2017 07:57  Vitamin D, 25-Hydroxy Latest Ref Range: 30.0 - 100.0 ng/mL 19.8 (L)   ASSESSMENT AND PLAN: Type 2 Katie mellitus without complication, without long-term current use of insulin (HCC)  Class 3 severe obesity with serious comorbidity and body mass index (BMI) of 50.0 to 59.9 in adult, unspecified obesity type ([place])  PLAN:  Katie II Katie Harris has been given extensive Katie education by myself today including ideal fasting and post-prandial blood glucose readings, individual ideal Hgb A1c goals and hypoglycemia prevention. We discussed the importance of good blood sugar control to decrease the likelihood of diabetic complications such as nephropathy, neuropathy, limb loss, blindness, coronary artery disease, and death. We discussed the importance of intensive lifestyle modification including diet, exercise and weight loss as the first line treatment for Katie. Katie Harris agrees to continue metformin and diet  prescription and follow up at the agreed upon time.  We spent > than 50% of the 15 minute visit on the counseling as documented in the note.  Obesity Katie Harris is currently in the action stage of change. As such, her goal is to continue with weight loss efforts She has agreed to follow the Category 3 plan Katie Harris has been instructed to work up to a goal of 150 minutes of combined cardio and strengthening exercise per week for weight loss and overall health benefits. We discussed the following Behavioral Modification Strategies today: increasing lean protein intake, decreasing simple carbohydrates  and work on meal planning and easy cooking plans  Katie Harris has agreed to follow up with our clinic in 2 weeks. She was informed of the importance of frequent follow up visits to maximize her success with intensive lifestyle modifications for her multiple health conditions.   OBESITY BEHAVIORAL INTERVENTION VISIT  Today's visit was # 4 out of 22.  Starting weight: 362 lbs Starting date: 04/22/17 Today's weight : 355 lbs Today's date: 06/11/2017 Total lbs lost to date: 7 (Patients must lose 7 lbs in the first 6 months to continue with counseling)   ASK: We discussed the diagnosis of obesity with Katie Harris today and Katie Harris agreed to give [name] permission to discuss obesity behavioral modification therapy today.  ASSESS: Katie Harris has the diagnosis of obesity and her BMI today is 52.4 Katie Harris is in the action stage of change   ADVISE: Katie Harris was educated on the multiple health risks of obesity as well as the benefit of weight loss to improve her health. She was advised of the need for long term treatment and the importance of lifestyle modifications.  AGREE: Multiple dietary modification options and treatment options were discussed and  Katie Harris agreed to the above obesity treatment plan.  I, Doreene Nest, am acting as transcriptionist for Katie Nip, MD  I have reviewed the above documentation for  accuracy and completeness, and I agree with the above. -Katie Nip, MD

## 2017-06-24 ENCOUNTER — Encounter (INDEPENDENT_AMBULATORY_CARE_PROVIDER_SITE_OTHER): Payer: Self-pay

## 2017-06-24 ENCOUNTER — Ambulatory Visit (INDEPENDENT_AMBULATORY_CARE_PROVIDER_SITE_OTHER): Payer: Managed Care, Other (non HMO) | Admitting: Family Medicine

## 2017-06-29 ENCOUNTER — Other Ambulatory Visit: Payer: Self-pay | Admitting: Neurology

## 2017-06-29 ENCOUNTER — Encounter: Payer: Self-pay | Admitting: Neurology

## 2017-06-29 MED ORDER — GABAPENTIN 300 MG PO CAPS: 600.0000 mg | ORAL_CAPSULE | Freq: Three times a day (TID) | ORAL | 5 refills | Status: DC

## 2017-07-06 ENCOUNTER — Ambulatory Visit (INDEPENDENT_AMBULATORY_CARE_PROVIDER_SITE_OTHER): Payer: Managed Care, Other (non HMO) | Admitting: Physician Assistant

## 2017-07-06 VITALS — BP 125/73 | HR 58 | Temp 99.1°F | Ht 69.0 in | Wt 354.0 lb

## 2017-07-06 DIAGNOSIS — R001 Bradycardia, unspecified: Secondary | ICD-10-CM | POA: Diagnosis not present

## 2017-07-06 DIAGNOSIS — Z6841 Body Mass Index (BMI) 40.0 and over, adult: Secondary | ICD-10-CM

## 2017-07-06 DIAGNOSIS — K5909 Other constipation: Secondary | ICD-10-CM | POA: Diagnosis not present

## 2017-07-06 DIAGNOSIS — E559 Vitamin D deficiency, unspecified: Secondary | ICD-10-CM | POA: Diagnosis not present

## 2017-07-06 DIAGNOSIS — E119 Type 2 diabetes mellitus without complications: Secondary | ICD-10-CM

## 2017-07-06 DIAGNOSIS — Z9189 Other specified personal risk factors, not elsewhere classified: Secondary | ICD-10-CM

## 2017-07-06 MED ORDER — ACCU-CHEK SOFTCLIX LANCETS MISC: 1.0000 | Freq: Two times a day (BID) | 0 refills | Status: DC

## 2017-07-06 MED ORDER — GLUCOSE BLOOD VI STRP: 1.0000 | ORAL_STRIP | Freq: Two times a day (BID) | 0 refills | Status: DC

## 2017-07-06 MED ORDER — METFORMIN HCL 500 MG PO TABS: 500.0000 mg | ORAL_TABLET | Freq: Every day | ORAL | 0 refills | Status: DC

## 2017-07-06 MED ORDER — ACCU-CHEK GUIDE W/DEVICE KIT: 1.0000 | PACK | Freq: Two times a day (BID) | 0 refills | Status: DC

## 2017-07-06 MED ORDER — VITAMIN D (ERGOCALCIFEROL) 1.25 MG (50000 UNIT) PO CAPS: 50000.0000 [IU] | ORAL_CAPSULE | ORAL | 0 refills | Status: DC

## 2017-07-07 ENCOUNTER — Encounter: Payer: Self-pay | Admitting: Neurology

## 2017-07-07 NOTE — Progress Notes (Signed)
Office: [phone]  /  Fax: [phone]   HPI:   Chief Complaint: OBESITY Katie Harris is here to discuss her progress with her obesity treatment plan. She is on the Category 3 plan and is following her eating plan approximately 75 % of the time. She states she is exercising 0 minutes 0 times per week. Katie Harris had celebration eating over the past 2 weeks. She is motivated to get back on track and continue with weight loss.  Her weight is (!) 354 lb (160.6 kg) today and has had a weight loss of 1 pound over a period of 3 to 4 weeks since her last visit. She has lost 8 lbs since starting treatment with [name].  Vitamin D Deficiency Katie Harris has a diagnosis of vitamin D deficiency. She is currently taking prescription Vit D and denies nausea, vomiting or muscle weakness.  At risk for osteopenia and osteoporosis Katie Harris is at higher risk of osteopenia and osteoporosis due to vitamin D deficiency.   Diabetes II Katie Harris has a diagnosis of diabetes type II. Katie Harris states she does not have a glucometer. She denies any hypoglycemic episodes. She is on metformin 500 mg qd. Last A1c was 7.4 on 04/23/17. She has been working on intensive lifestyle modifications including diet, exercise, and weight loss to help control her blood glucose levels.  Katie Harris Katie Harris's heart rate is at 58. She is on metoprolol 50 mg BID for hypertension. Blood pressure is stable today, but has been elevated recently. She denies chest pain, dyspnea, weakness, or fatigue.  Constipation Katie Harris notes constipation for the last few weeks, worse since attempting weight loss. She states BM are less frequent and are not hard and painful. She denies hematochezia or melena. She denies drinking less H20 recently.  ALLERGIES: No Known Allergies  MEDICATIONS: Current Outpatient Medications on File Prior to Visit  Medication Sig Dispense Refill  . cyclobenzaprine (FLEXERIL) 10 MG tablet Take 1 tablet (10 mg total) by mouth 3 (three) times daily as  needed for muscle spasms. (Patient taking differently: Take 10 mg by mouth at bedtime. ) 60 tablet 0  . DULoxetine (CYMBALTA) 60 MG capsule Take 60 mg by mouth daily.   6  . gabapentin (NEURONTIN) 300 MG capsule Take 2 capsules (600 mg total) by mouth 3 (three) times daily. 270 capsule 5  . metoprolol (LOPRESSOR) 50 MG tablet Take 50 mg by mouth 2 (two) times daily.     . polyethylene glycol powder (GLYCOLAX/MIRALAX) powder Take 17 g by mouth daily.     No current facility-administered medications on file prior to visit.     PAST MEDICAL HISTORY: Past Medical History:  Diagnosis Date  . Anxiety   . Arthritis    lower back  . Back pain   . 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 Cancer ([place])    basal cell removed from left hairline area  . Depression   . Diabetes mellitus without complication ([place])    recent A1C 6.5 no meds presently  . Factor 5 Leiden mutation, heterozygous ([place]) 04/21/2012   Lab 04/16/12 in view of positive Leiden in her father; no personal hx of thrombosis despite prior pregnancy & C-section 2007  . 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 HTN (hypertension), benign 04/21/2012  . Hypertension   . Hyperthyroidism    levels were abnormal in Dec- no treatment currently  . Hyperthyroidism 04/21/2012  . Infertility, female   . Leg edema   .  PCOS (polycystic ovarian syndrome)   . Peripheral vascular disease (HCC)    neuropathy bilateral feet  . Prediabetes   . Sleep apnea     PAST SURGICAL HISTORY: Past Surgical History:  Procedure Laterality Date  . CESAREAN SECTION    . DILATATION & CURETTAGE/HYSTEROSCOPY WITH MYOSURE N/A 10/17/2016   Procedure: DILATATION & CURETTAGE/HYSTEROSCOPY WITH MYOSURE POLYPECTOMY;  Surgeon: Paula Compton, MD;  Location: [facility];  Service: Gynecology;  Laterality: N/A;  myosure rep will be here confirmed on 10/06/16  . DILATION AND CURETTAGE OF UTERUS     polpectomy  . INTRAUTERINE DEVICE (IUD) INSERTION N/A  10/17/2016   Procedure: INTRAUTERINE DEVICE (IUD) INSERTION MIRENA;  Surgeon: Paula Compton, MD;  Location: [facility];  Service: Gynecology;  Laterality: N/A;  . TONSILLECTOMY      SOCIAL HISTORY: Social History   Tobacco Use  . Smoking status: Never Smoker  . Smokeless tobacco: Never Used  Substance Use Topics  . Alcohol use: No  . Drug use: No    FAMILY HISTORY: Family History  Problem Relation Age of Onset  . Hypertension Mother   . Hyperlipidemia Mother   . Thyroid disease Mother   . Depression Mother   . Anxiety disorder Mother   . Sleep apnea Mother   . Obesity Mother   . Heart disease Father   . Diabetes Father   . Hypertension Father   . Hyperlipidemia Father   . Obesity Father   . Diabetes Maternal Grandmother   . Hyperlipidemia Maternal Grandmother   . Hyperlipidemia Maternal Grandfather   . Hyperlipidemia Paternal Grandmother   . Diabetes Paternal Grandmother   . Cancer Paternal Grandmother   . Heart disease Paternal Grandfather     ROS: Review of Systems  Constitutional: Positive for weight loss. Negative for malaise/fatigue.  Respiratory: Negative for shortness of breath.   Cardiovascular: Negative for chest pain.  Gastrointestinal: Positive for constipation. Negative for melena, nausea and vomiting.       Negative hematochezia  Musculoskeletal:       Negative muscle weakness  Neurological: Negative for weakness.  Endo/Heme/Allergies:       Negative hypoglycemia    PHYSICAL EXAM: Blood pressure 125/73, pulse (!) 58, temperature 99.1 F (37.3 C), temperature source Oral, height '5\' 9"'$  (1.753 m), weight (!) 354 lb (160.6 kg), SpO2 98 %. Body mass index is 52.28 kg/m. Physical Exam  Constitutional: She is oriented to person, place, and time. She appears well-developed and well-nourished.  Cardiovascular: Katie Harris present.  Pulmonary/Chest: Effort normal.  Musculoskeletal: Normal range of motion.  Neurological: She is oriented to person,  place, and time.  Skin: Skin is warm and dry.  Psychiatric: She has a normal mood and affect. Her behavior is normal.  Vitals reviewed.   RECENT LABS AND TESTS: BMET    Component Value Date/Time   NA 139 04/23/2017 0757   K 5.3 (H) 04/23/2017 0757   CL 100 04/23/2017 0757   CO2 24 04/23/2017 0757   GLUCOSE 171 (H) 04/23/2017 0757   GLUCOSE 149 (H) 10/09/2016 0950   BUN 9 04/23/2017 0757   CREATININE 0.71 04/23/2017 0757   CALCIUM 9.1 04/23/2017 0757   GFRNONAA 105 04/23/2017 0757   GFRAA 121 04/23/2017 0757   Lab Results  Component Value Date   HGBA1C 7.4 (H) 04/23/2017   Lab Results  Component Value Date   INSULIN 43.4 (H) 04/23/2017   CBC    Component Value Date/Time   WBC 8.1 04/23/2017 0757   WBC  8.6 10/09/2016 0950   RBC 4.47 04/23/2017 0757   RBC 4.26 10/09/2016 0950   HGB 13.1 04/23/2017 0757   HCT 39.3 04/23/2017 0757   PLT 294 10/09/2016 0950   MCV 88 04/23/2017 0757   MCH 29.3 04/23/2017 0757   MCH 30.5 10/09/2016 0950   MCHC 33.3 04/23/2017 0757   MCHC 33.8 10/09/2016 0950   RDW 14.2 04/23/2017 0757   LYMPHSABS 2.5 04/23/2017 0757   MONOABS 0.5 07/09/2016 1233   EOSABS 0.2 04/23/2017 0757   BASOSABS 0.1 04/23/2017 0757   Iron/TIBC/Ferritin/ %Sat No results found for: IRON, TIBC, FERRITIN, IRONPCTSAT Lipid Panel     Component Value Date/Time   CHOL 234 (H) 04/23/2017 0757   TRIG 147 04/23/2017 0757   HDL 38 (L) 04/23/2017 0757   LDLCALC 167 (H) 04/23/2017 0757   Hepatic Function Panel     Component Value Date/Time   PROT 7.1 04/23/2017 0757   ALBUMIN 4.2 04/23/2017 0757   AST 13 04/23/2017 0757   ALT 16 04/23/2017 0757   ALKPHOS 87 04/23/2017 0757   BILITOT 0.4 04/23/2017 0757      Component Value Date/Time   TSH 4.780 (H) 04/23/2017 0757   TSH 4.260 01/06/2017 1046   TSH 2.78 07/29/2016 1655  Results for JENISSA, TYRELL (MRN [id number]) as of 07/07/2017 12:50  Ref. Range 04/23/2017 07:57  Vitamin D, 25-Hydroxy Latest Ref Range:  30.0 - 100.0 ng/mL 19.8 (L)    ASSESSMENT AND PLAN: Vitamin D deficiency - Plan: Vitamin D, Ergocalciferol, (DRISDOL) 50000 units CAPS capsule  Type 2 diabetes mellitus without complication, without long-term current use of insulin (HCC) - Plan: metFORMIN (GLUCOPHAGE) 500 MG tablet, Blood Glucose Monitoring Suppl (ACCU-CHEK GUIDE) w/Device KIT, glucose blood (ACCU-CHEK GUIDE) test strip, ACCU-CHEK SOFTCLIX LANCETS lancets  Katie Harris  Other constipation  At risk for osteoporosis  Class 3 severe obesity with serious comorbidity and body mass index (BMI) of 50.0 to 59.9 in adult, unspecified obesity type ([place])  PLAN:  Vitamin D Deficiency Katie Harris was informed that low vitamin D levels contributes to fatigue and are associated with obesity, breast, and colon cancer. Katie Harris agrees to continue taking prescription Vit D '@50'$ ,000 IU every week #4 and we will refill for 1 month. She will follow up for routine testing of vitamin D, at least 2-3 times per year. She was informed of the risk of over-replacement of vitamin D and agrees to not increase her dose unless she discusses this with [name] first. Katie Harris agrees to follow up with our clinic in 2 weeks.  At risk for osteopenia and osteoporosis Katie Harris is at risk for osteopenia and osteoporsis due to her vitamin D deficiency. She was encouraged to take her vitamin D and follow her higher calcium diet and increase strengthening exercise to help strengthen her bones and decrease her risk of osteopenia and osteoporosis.  Diabetes II Katie Harris has been given extensive diabetes education by myself today including ideal fasting and post-prandial blood glucose readings, individual ideal Hgb A1c goals and hypoglycemia prevention. We discussed the importance of good blood sugar control to decrease the likelihood of diabetic complications such as nephropathy, neuropathy, limb loss, blindness, coronary artery disease, and death. We discussed the importance of intensive  lifestyle modification including diet, exercise and weight loss as the first line treatment for diabetes. Katie Harris agrees to continue taking metformin 500 mg q AM #30 and we will refill for 1 month; we will send glucometer and test strips in to the pharmacy and Katie Harris is to  test her BGs BID. Katie Harris agrees to follow up with our clinic in 2 weeks.  Katie Harris Katie Harris is advised to keep a log of heart rate and blood pressure and bring in for review at next visit in 2 weeks. If heart rate is 50 or below, she is to stop taking second dose of metoprolol. She is to follow up with [name] in 2 weeks. If she develops any symptoms, she is to follow up with her primary care provider.  Constipation Katie Harris was informed decrease bowel movement frequency is normal while losing weight, but stools should not be hard or painful. She was advised to increase her H20 intake and work on increasing her fiber intake. High fiber foods were discussed today. Giannina agrees to start miralax 1 capful daily OTC as needed and she agrees to follow up with our clinic in 2 weeks.   Obesity Katie Harris is currently in the action stage of change. As such, her goal is to continue with weight loss efforts She has agreed to follow the Category 3 plan Katie Harris has been instructed to work up to a goal of 150 minutes of combined cardio and strengthening exercise per week for weight loss and overall health benefits. We discussed the following Behavioral Modification Strategies today: increasing lean protein intake and work on meal planning and easy cooking plans   Katie Harris has agreed to follow up with our clinic in 2 weeks. She was informed of the importance of frequent follow up visits to maximize her success with intensive lifestyle modifications for her multiple health conditions.   OBESITY BEHAVIORAL INTERVENTION VISIT  Today's visit was # 5 out of 22.  Starting weight: 362 lbs Starting date: 04/22/17 Today's weight : 354 lbs Today's date:  07/06/2017 Total lbs lost to date: 8 (Patients must lose 7 lbs in the first 6 months to continue with counseling)   ASK: We discussed the diagnosis of obesity with Katie Harris today and Katie Harris agreed to give [name] permission to discuss obesity behavioral modification therapy today.  ASSESS: Katie Harris has the diagnosis of obesity and her BMI today is 52.25 Katie Harris is in the action stage of change   ADVISE: Katie Harris was educated on the multiple health risks of obesity as well as the benefit of weight loss to improve her health. She was advised of the need for long term treatment and the importance of lifestyle modifications.  AGREE: Multiple dietary modification options and treatment options were discussed and  Katie Harris agreed to the above obesity treatment plan.   Katie Harris Durie, am acting as transcriptionist for [employer], PA-C

## 2017-07-09 ENCOUNTER — Ambulatory Visit: Payer: Managed Care, Other (non HMO) | Admitting: Neurology

## 2017-07-09 ENCOUNTER — Encounter: Payer: Self-pay | Admitting: Neurology

## 2017-07-09 ENCOUNTER — Telehealth: Payer: Self-pay | Admitting: Neurology

## 2017-07-09 VITALS — BP 144/74 | HR 65 | Ht 69.0 in | Wt 360.0 lb

## 2017-07-09 DIAGNOSIS — G4733 Obstructive sleep apnea (adult) (pediatric): Secondary | ICD-10-CM

## 2017-07-09 DIAGNOSIS — Z9989 Dependence on other enabling machines and devices: Secondary | ICD-10-CM | POA: Diagnosis not present

## 2017-07-09 NOTE — Progress Notes (Signed)
Subjective:    Patient ID: Katie Harris is a 42 y.o. female.  HPI     Interim history:   Katie Harris is a 42 year old right-handed woman with an underlying medical history of idiopathic intracranial hypertension, anxiety, arthritis, factor V Leiden, recurrent headaches, hypertension, hyperthyroidism, history of neuropathy, and morbid obesity with a BMI of over 32, who presents for follow-up consultation of her obstructive sleep apnea, after recent sleep study testing and starting AutoPap therapy. Patient is unaccompanied today. I first met her on 01/06/2017 at the request of Dr. Jaynee Eagles, at which time the patient reported snoring and morning headaches. I suggested we proceed with a sleep study. She had a baseline sleep study on 04/17/2017. I went over her test results with her in detail today. Her sleep latency was delayed at 48 minutes, sleep efficiency reduced at 69.1%, REM latency was delayed at 325.5 minutes. She had an increased percentage of stage I sleep, an increased percentage of slow-wave sleep and a reduced percentage of REM sleep. Total AHI was 8.4 per hour, REM AHI was 11.8 per hour, supine sleep was not achieved. Average oxygen saturation was 96%, nadir was 83%. Time below 89% saturation was 16 minutes. She had no significant PLMS. I suggested treatment of her mild OSA with AutoPap therapy at home.  Today, 07/09/2017: I reviewed her AutoPap compliance data from 06/08/2017 through 07/07/2017 which is a total of 30 days, during which time she used her AutoPap every night with percent used days greater than 4 hours at 100%, indicating superb compliance with an average usage of 8 hours and 45 minutes, residual AHI 2.3 per hour, 95th percentile pressure at 12.3 cm, leak acceptable with the 95th percentile of 12.4 L/m on a pressure range of 5-13 cm with EPR. She reports feeling about the same, she is able to tolerate AutoPap. She uses a fullface mask. She had a recent increase in her gabapentin to  600 mg 3 times a day but could not tolerate the daytime dose that high. She reports some improvement in her headache.  Previously:  01/06/2017: (She) reports snoring and morning headaches. She had an eye exam recently no papilledema. Tonsillectomy as a child. She is s/p RAI last year in September. Her father has OSA. Her husband has noted leg movements and twitching, while she is asleep.  I reviewed your office note from 12/04/2016. She had blood work today and felt a little lightheaded, improved after a little snack. Epworth sleepiness score is 5 out of 24, fatigue score is 38 out of 63. She is a nonsmoker and does not utilize alcohol, drinks caffeine about 2-3 servings per day. She lives at home with her husband and her 70 year old daughter. She works for a [employer], [employer]. She feels tired during the day and not fully rested when she first wakes up. She tries to be embedded before 11. Wakeup time is around 7. She denies telltale symptoms of restless leg syndrome. She had a tonsillectomy as a child. She has nocturia about once per average night. She has had morning headaches. Her father has sleep apnea. Her snoring can be loud and disruptive to her husband's sleep.   Her Past Medical History Is Significant For: Past Medical History:  Diagnosis Date  . Anxiety   . Arthritis    lower back  . Back pain   . 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 Cancer ([place])    basal cell removed from left hairline area  .  Depression   . Diabetes mellitus without complication ([place])    recent A1C 6.5 no meds presently  . Factor 5 Leiden mutation, heterozygous ([place]) 04/21/2012   Lab 04/16/12 in view of positive Leiden in her father; no personal hx of thrombosis despite prior pregnancy & C-section 2007  . 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 HTN (hypertension), benign 04/21/2012  . Hypertension   . Hyperthyroidism    levels were abnormal in Dec- no treatment currently  .  Hyperthyroidism 04/21/2012  . Infertility, female   . Leg edema   . PCOS (polycystic ovarian syndrome)   . Peripheral vascular disease ([facility])    neuropathy bilateral feet  . Prediabetes   . Sleep apnea     Her Past Surgical History Is Significant For: Past Surgical History:  Procedure Laterality Date  . CESAREAN SECTION    . DILATATION & CURETTAGE/HYSTEROSCOPY WITH MYOSURE N/A 10/17/2016   Procedure: DILATATION & CURETTAGE/HYSTEROSCOPY WITH MYOSURE POLYPECTOMY;  Surgeon: Paula Compton, MD;  Location: [facility];  Service: Gynecology;  Laterality: N/A;  myosure rep will be here confirmed on 10/06/16  . DILATION AND CURETTAGE OF UTERUS     polpectomy  . INTRAUTERINE DEVICE (IUD) INSERTION N/A 10/17/2016   Procedure: INTRAUTERINE DEVICE (IUD) INSERTION MIRENA;  Surgeon: Paula Compton, MD;  Location: [facility];  Service: Gynecology;  Laterality: N/A;  . TONSILLECTOMY      Her Family History Is Significant For: Family History  Problem Relation Age of Onset  . Hypertension Mother   . Hyperlipidemia Mother   . Thyroid disease Mother   . Depression Mother   . Anxiety disorder Mother   . Sleep apnea Mother   . Obesity Mother   . Heart disease Father   . Diabetes Father   . Hypertension Father   . Hyperlipidemia Father   . Obesity Father   . Diabetes Maternal Grandmother   . Hyperlipidemia Maternal Grandmother   . Hyperlipidemia Maternal Grandfather   . Hyperlipidemia Paternal Grandmother   . Diabetes Paternal Grandmother   . Cancer Paternal Grandmother   . Heart disease Paternal Grandfather     Her Social History Is Significant For: Social History   Socioeconomic History  . Marital status: Married    Spouse name: Rebekah Zackery  . Number of children: 1  . Years of education: Not on file  . Highest education level: Some college, no degree  Occupational History  . Occupation: Secretary/administrator  Social Needs  . Financial resource strain: Not on file  . Food insecurity:     Worry: Not on file    Inability: Not on file  . Transportation needs:    Medical: Not on file    Non-medical: Not on file  Tobacco Use  . Smoking status: Never Smoker  . Smokeless tobacco: Never Used  Substance and Sexual Activity  . Alcohol use: No  . Drug use: No  . Sexual activity: Yes    Birth control/protection: None  Lifestyle  . Physical activity:    Days per week: Not on file    Minutes per session: Not on file  . Stress: Not on file  Relationships  . Social connections:    Talks on phone: Not on file    Gets together: Not on file    Attends religious service: Not on file    Active member of club or organization: Not on file    Attends meetings of clubs or organizations: Not on file  Relationship status: Not on file  Other Topics Concern  . Not on file  Social History Narrative   Lives at home with her husband, child and dog    Right handed   No caffeine       Her Allergies Are:  No Known Allergies:   Her Current Medications Are:  Outpatient Encounter Medications as of 07/09/2017  Medication Sig  . ACCU-CHEK SOFTCLIX LANCETS lancets 1 each by Other route 2 (two) times daily. Use as instructed  . Blood Glucose Monitoring Suppl (ACCU-CHEK GUIDE) w/Device KIT 1 kit by Does not apply route 2 (two) times daily.  . DULoxetine (CYMBALTA) 60 MG capsule Take 60 mg by mouth daily.   Marland Kitchen gabapentin (NEURONTIN) 300 MG capsule Take 2 capsules (600 mg total) by mouth 3 (three) times daily.  Marland Kitchen glucose blood (ACCU-CHEK GUIDE) test strip 1 each by Other route 2 (two) times daily. Use as instructed  . metFORMIN (GLUCOPHAGE) 500 MG tablet Take 1 tablet (500 mg total) by mouth daily with breakfast.  . metoprolol (LOPRESSOR) 50 MG tablet Take 50 mg by mouth 2 (two) times daily.   . polyethylene glycol powder (GLYCOLAX/MIRALAX) powder Take 17 g by mouth daily.  . Vitamin D, Ergocalciferol, (DRISDOL) 50000 units CAPS capsule Take 1 capsule (50,000 Units total) by mouth every 7 (seven)  days.  . [DISCONTINUED] cyclobenzaprine (FLEXERIL) 10 MG tablet Take 1 tablet (10 mg total) by mouth 3 (three) times daily as needed for muscle spasms. (Patient taking differently: Take 10 mg by mouth at bedtime. )   No facility-administered encounter medications on file as of 07/09/2017.   :  Review of Systems:  Out of a complete 14 point review of systems, all are reviewed and negative with the exception of these symptoms as listed below:  Review of Systems  Neurological:       Pt presents today to discuss her auto pap. Pt feels that she is doing about that same.    Objective:  Neurological Exam  Physical Exam Physical Examination:   Vitals:   07/09/17 1501  BP: (!) 144/74  Pulse: 65   General Examination: The patient is a very pleasant 42 y.o. female in no acute distress. She appears well-developed and well-nourished and well groomed.   HEENT: Normocephalic, atraumatic, pupils are equal, round and reactive to light and accommodation. Extraocular tracking is good without limitation to gaze excursion or nystagmus noted. Normal smooth pursuit is noted. Hearing is grossly intact. Face is symmetric with normal facial animation and normal facial sensation. Speech is clear with no dysarthria noted. There is no hypophonia. There is no lip, neck/head, jaw or voice tremor. Neck is supple with full range of passive and active motion. Oropharynx exam reveals: mild mouth dryness, adequate dental hygiene and mild airway crowding.   Chest: Clear to auscultation without wheezing, rhonchi or crackles noted.  Heart: S1+S2+0, regular and normal without murmurs, rubs or gallops noted.   Abdomen: Soft, non-tender and non-distended with normal bowel sounds appreciated on auscultation.  Extremities: There is no obvious change.   Skin: Warm and dry without trophic changes noted.  Musculoskeletal: exam reveals no obvious joint deformities, tenderness or joint swelling or erythema.    Neurologically:  Mental status: The patient is awake, alert and oriented in all 4 spheres. Her immediate and remote memory, attention, language skills and fund of knowledge are appropriate. There is no evidence of aphasia, agnosia, apraxia or anomia. Speech is clear with normal prosody and enunciation. Thought process  is linear. Mood is normal and affect is normal.  Cranial nerves II - XII are as described above under HEENT exam.  Motor exam: Normal bulk, strength and tone is noted. There is no drift, tremor or rebound. Romberg is negative. Reflexes are 2+ throughout. Fine motor skills and coordination: intact with normal finger taps, normal hand movements, normal rapid alternating patting, normal foot taps and normal foot agility.  Cerebellar testing: No dysmetria or intention tremor. Sensory exam: intact to light touch in the upper and lower extremities.  Gait, station and balance: She stands easily. No veering to one side is noted. No leaning to one side is noted. Posture is age-appropriate and stance is narrow based. Gait shows normal stride length and normal pace. No problems turning are noted.   Assessment and Plan:  In summary, Katie Harris is a very pleasant 42 year old female with an underlying medical history of idiopathic intracranial hypertension, anxiety, arthritis, factor V Leiden, recurrent headaches, hypertension, hyperthyroidism with s/p RAI in 9/17, history of neuropathy, and morbid obesity with a BMI of over 50, who presents for follow-up consultation of her overall mild obstructive sleep apnea as determined by her sleep study on 04/17/2017. She has been on AutoPap therapy. She is fully compliant with it and commended for this. She has noticed no telltale changes in her sleep quality but does endorse mild improvement in her headaches. We talked about her sleep study results in detail and also reviewed her compliance data. She is advised to continue with AutoPap therapy and she is  motivated to continue. We talked about the importance of weight management as well. She is advised to follow-up routinely in 6 months, she can see one of our nurse practitioners.  I answered all her questions today and she was in agreement.

## 2017-07-09 NOTE — Telephone Encounter (Signed)
Pt. States she will call us to schedule 6 mo f/u.

## 2017-07-09 NOTE — Patient Instructions (Signed)
Please continue using your autoPAP regularly. While your insurance requires that you use PAP at least 4 hours each night on 70% of the nights, I recommend, that you not skip any nights and use it throughout the night if you can. Getting used to PAP and staying with the treatment long term does take time and patience and discipline. Untreated obstructive sleep apnea when it is moderate to severe can have an adverse impact on cardiovascular health and raise her risk for heart disease, arrhythmias, hypertension, congestive heart failure, stroke and diabetes. Untreated obstructive sleep apnea causes sleep disruption, nonrestorative sleep, and sleep deprivation. This can have an impact on your day to day functioning and cause daytime sleepiness and impairment of cognitive function, memory loss, mood disturbance, and problems focussing. Using PAP regularly can improve these symptoms.  We will do a 6 month recheck, you can see one of our nurse practitioners as you are stable. I will see you after that.

## 2017-07-20 ENCOUNTER — Ambulatory Visit (INDEPENDENT_AMBULATORY_CARE_PROVIDER_SITE_OTHER): Payer: Managed Care, Other (non HMO) | Admitting: Physician Assistant

## 2017-07-20 VITALS — BP 136/76 | HR 64 | Temp 98.3°F | Ht 69.0 in | Wt 355.0 lb

## 2017-07-20 DIAGNOSIS — E1165 Type 2 diabetes mellitus with hyperglycemia: Secondary | ICD-10-CM

## 2017-07-20 DIAGNOSIS — Z9189 Other specified personal risk factors, not elsewhere classified: Secondary | ICD-10-CM | POA: Diagnosis not present

## 2017-07-20 DIAGNOSIS — Z6841 Body Mass Index (BMI) 40.0 and over, adult: Secondary | ICD-10-CM

## 2017-07-20 DIAGNOSIS — E559 Vitamin D deficiency, unspecified: Secondary | ICD-10-CM

## 2017-07-20 MED ORDER — METFORMIN HCL 500 MG PO TABS: 500.0000 mg | ORAL_TABLET | Freq: Two times a day (BID) | ORAL | 0 refills | Status: DC

## 2017-07-20 MED ORDER — VITAMIN D (ERGOCALCIFEROL) 1.25 MG (50000 UNIT) PO CAPS: 50000.0000 [IU] | ORAL_CAPSULE | ORAL | 0 refills | Status: DC

## 2017-07-20 NOTE — Progress Notes (Signed)
Office: [phone]  /  Fax: [phone]   HPI:   Chief Complaint: OBESITY Katie Harris is here to discuss her progress with her obesity treatment plan. She is on the Category 3 plan and is following her eating plan approximately 70 % of the time. She states she is exercising 0 minutes 0 times per week. Katie Harris has not been as mindful of her eating and has skipped meals on occasion. She also has not been preplanning her meals as well. Her weight is (!) 355 lb (161 kg) today and has had a weight gain of 1 pound over a period of 2 weeks since her last visit. She has lost 7 lbs since starting treatment with [name].  Vitamin D deficiency Katie Harris has a diagnosis of vitamin D deficiency. She is currently taking vit D and denies nausea, vomiting or muscle weakness.  Diabetes II non insulin with hyperglycemia Katie Harris has a diagnosis of diabetes type II. Katie Harris states fasting BGs range between 110's and 170's, post prandial is 160 and denies any hypoglycemic episodes. She has been working on intensive lifestyle modifications including diet, exercise, and weight loss to help control her blood glucose levels.  At risk for cardiovascular disease Katie Harris is at a higher than average risk for cardiovascular disease due to obesity and diabetes. She currently denies any chest pain.  ALLERGIES: No Known Allergies  MEDICATIONS: Current Outpatient Medications on File Prior to Visit  Medication Sig Dispense Refill  . ACCU-CHEK SOFTCLIX LANCETS lancets 1 each by Other route 2 (two) times daily. Use as instructed 100 each 0  . Blood Glucose Monitoring Suppl (ACCU-CHEK GUIDE) w/Device KIT 1 kit by Does not apply route 2 (two) times daily. 1 kit 0  . DULoxetine (CYMBALTA) 60 MG capsule Take 60 mg by mouth daily.   6  . gabapentin (NEURONTIN) 300 MG capsule Take 2 capsules (600 mg total) by mouth 3 (three) times daily. 270 capsule 5  . glucose blood (ACCU-CHEK GUIDE) test strip 1 each by Other route 2 (two) times daily. Use as  instructed 100 each 0  . metoprolol (LOPRESSOR) 50 MG tablet Take 50 mg by mouth 2 (two) times daily.     . polyethylene glycol powder (GLYCOLAX/MIRALAX) powder Take 17 g by mouth daily.     No current facility-administered medications on file prior to visit.     PAST MEDICAL HISTORY: Past Medical History:  Diagnosis Date  . Anxiety   . Arthritis    lower back  . Back pain   . 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 Cancer ([place])    basal cell removed from left hairline area  . Depression   . Diabetes mellitus without complication ([place])    recent A1C 6.5 no meds presently  . Factor 5 Leiden mutation, heterozygous ([place]) 04/21/2012   Lab 04/16/12 in view of positive Leiden in her father; no personal hx of thrombosis despite prior pregnancy & C-section 2007  . 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 HTN (hypertension), benign 04/21/2012  . Hypertension   . Hyperthyroidism    levels were abnormal in Dec- no treatment currently  . Hyperthyroidism 04/21/2012  . Infertility, female   . Leg edema   . PCOS (polycystic ovarian syndrome)   . Peripheral vascular disease (HCC)    neuropathy bilateral feet  . Prediabetes   . Sleep apnea     PAST SURGICAL HISTORY: Past Surgical History:  Procedure Laterality Date  .  CESAREAN SECTION    . DILATATION & CURETTAGE/HYSTEROSCOPY WITH MYOSURE N/A 10/17/2016   Procedure: DILATATION & CURETTAGE/HYSTEROSCOPY WITH MYOSURE POLYPECTOMY;  Surgeon: Paula Compton, MD;  Location: [facility];  Service: Gynecology;  Laterality: N/A;  myosure rep will be here confirmed on 10/06/16  . DILATION AND CURETTAGE OF UTERUS     polpectomy  . INTRAUTERINE DEVICE (IUD) INSERTION N/A 10/17/2016   Procedure: INTRAUTERINE DEVICE (IUD) INSERTION MIRENA;  Surgeon: Paula Compton, MD;  Location: [facility];  Service: Gynecology;  Laterality: N/A;  . TONSILLECTOMY      SOCIAL HISTORY: Social History   Tobacco Use  . Smoking status: Never Smoker  .  Smokeless tobacco: Never Used  Substance Use Topics  . Alcohol use: No  . Drug use: No    FAMILY HISTORY: Family History  Problem Relation Age of Onset  . Hypertension Mother   . Hyperlipidemia Mother   . Thyroid disease Mother   . Depression Mother   . Anxiety disorder Mother   . Sleep apnea Mother   . Obesity Mother   . Heart disease Father   . Diabetes Father   . Hypertension Father   . Hyperlipidemia Father   . Obesity Father   . Diabetes Maternal Grandmother   . Hyperlipidemia Maternal Grandmother   . Hyperlipidemia Maternal Grandfather   . Hyperlipidemia Paternal Grandmother   . Diabetes Paternal Grandmother   . Cancer Paternal Grandmother   . Heart disease Paternal Grandfather     ROS: Review of Systems  Constitutional: Negative for weight loss.  Cardiovascular: Negative for chest pain.  Gastrointestinal: Negative for nausea and vomiting.  Musculoskeletal:       Negative for muscle weakness  Endo/Heme/Allergies:       Negative for hypoglycemia Positive for hyperglycemia    PHYSICAL EXAM: Blood pressure 136/76, pulse 64, temperature 98.3 F (36.8 C), temperature source Oral, height _0  (1.753 m), weight (!) 355 lb (161 kg), SpO2 97 %. Body mass index is 52.42 kg/m. Physical Exam  Constitutional: She is oriented to person, place, and time. She appears well-developed and well-nourished.  Cardiovascular: Normal rate.  Pulmonary/Chest: Effort normal.  Musculoskeletal: Normal range of motion.  Neurological: She is oriented to person, place, and time.  Skin: Skin is warm and dry.  Psychiatric: She has a normal mood and affect. Her behavior is normal.  Vitals reviewed.   RECENT LABS AND TESTS: BMET    Component Value Date/Time   NA 139 04/23/2017 0757   K 5.3 (H) 04/23/2017 0757   CL 100 04/23/2017 0757   CO2 24 04/23/2017 0757   GLUCOSE 171 (H) 04/23/2017 0757   GLUCOSE 149 (H) 10/09/2016 0950   BUN 9 04/23/2017 0757   CREATININE 0.71 04/23/2017  0757   CALCIUM 9.1 04/23/2017 0757   GFRNONAA 105 04/23/2017 0757   GFRAA 121 04/23/2017 0757   Lab Results  Component Value Date   HGBA1C 7.4 (H) 04/23/2017   Lab Results  Component Value Date   INSULIN 43.4 (H) 04/23/2017   CBC    Component Value Date/Time   WBC 8.1 04/23/2017 0757   WBC 8.6 10/09/2016 0950   RBC 4.47 04/23/2017 0757   RBC 4.26 10/09/2016 0950   HGB 13.1 04/23/2017 0757   HCT 39.3 04/23/2017 0757   PLT 294 10/09/2016 0950   MCV 88 04/23/2017 0757   MCH 29.3 04/23/2017 0757   MCH 30.5 10/09/2016 0950   MCHC 33.3 04/23/2017 0757   MCHC 33.8 10/09/2016 0950  RDW 14.2 04/23/2017 0757   LYMPHSABS 2.5 04/23/2017 0757   MONOABS 0.5 07/09/2016 1233   EOSABS 0.2 04/23/2017 0757   BASOSABS 0.1 04/23/2017 0757   Iron/TIBC/Ferritin/ %Sat No results found for: IRON, TIBC, FERRITIN, IRONPCTSAT Lipid Panel     Component Value Date/Time   CHOL 234 (H) 04/23/2017 0757   TRIG 147 04/23/2017 0757   HDL 38 (L) 04/23/2017 0757   LDLCALC 167 (H) 04/23/2017 0757   Hepatic Function Panel     Component Value Date/Time   PROT 7.1 04/23/2017 0757   ALBUMIN 4.2 04/23/2017 0757   AST 13 04/23/2017 0757   ALT 16 04/23/2017 0757   ALKPHOS 87 04/23/2017 0757   BILITOT 0.4 04/23/2017 0757      Component Value Date/Time   TSH 4.780 (H) 04/23/2017 0757   TSH 4.260 01/06/2017 1046   TSH 2.78 07/29/2016 1655   Results for BRITTAIN, SMITHEY (MRN [id number]) as of 07/20/2017 16:57  Ref. Range 04/23/2017 07:57  Vitamin D, 25-Hydroxy Latest Ref Range: 30.0 - 100.0 ng/mL 19.8 (L)   ASSESSMENT AND PLAN: Type 2 diabetes mellitus with hyperglycemia, without long-term current use of insulin (HCC) - Plan: metFORMIN (GLUCOPHAGE) 500 MG tablet  Vitamin D deficiency - Plan: Vitamin D, Ergocalciferol, (DRISDOL) 50000 units CAPS capsule  At risk for heart disease  Class 3 severe obesity with serious comorbidity and body mass index (BMI) of 50.0 to 59.9 in adult, unspecified obesity  type ([place])  PLAN:  Vitamin D Deficiency Katie Harris was informed that low vitamin D levels contributes to fatigue and are associated with obesity, breast, and colon cancer. She agrees to continue to take prescription Vit D _0 ,000 IU every week #4 with no refills and will follow up for routine testing of vitamin D, at least 2-3 times per year. She was informed of the risk of over-replacement of vitamin D and agrees to not increase her dose unless she discusses this with [name] first. Katie Harris agrees to follow up with our clinic in 2 weeks.  Diabetes II non insulin with hyperglycemia Katie Harris has been given extensive diabetes education by myself today including ideal fasting and post-prandial blood glucose readings, individual ideal Hgb A1c goals and hypoglycemia prevention. We discussed the importance of good blood sugar control to decrease the likelihood of diabetic complications such as nephropathy, neuropathy, limb loss, blindness, coronary artery disease, and death. We discussed the importance of intensive lifestyle modification including diet, exercise and weight loss as the first line treatment for diabetes. Cyd agrees to increase metformin to 500 mg bid #60 with no refills and follow up at the agreed upon time.  Cardiovascular risk counseling Katie Harris was given extended (15 minutes) coronary artery disease prevention counseling today. She is 42 y.o. female and has risk factors for heart disease including obesity and diabetes. We discussed intensive lifestyle modifications today with an emphasis on specific weight loss instructions and strategies. Pt was also informed of the importance of increasing exercise and decreasing saturated fats to help prevent heart disease.  Obesity Katie Harris is currently in the action stage of change. As such, her goal is to continue with weight loss efforts She has agreed to follow the Category 3 plan Katie Harris has been instructed to work up to a goal of 150 minutes of combined cardio and  strengthening exercise per week for weight loss and overall health benefits. We discussed the following Behavioral Modification Strategies today: no skipping meals and work on meal planning and easy cooking plans  Katie Harris has agreed  to follow up with our clinic in 2 weeks. She was informed of the importance of frequent follow up visits to maximize her success with intensive lifestyle modifications for her multiple health conditions.   OBESITY BEHAVIORAL INTERVENTION VISIT  Today's visit was # 6 out of 22.  Starting weight: 362 lbs Starting date: 04/22/17 Today's weight : 355 lbs Today's date: 07/20/2017 Total lbs lost to date: 7 (Patients must lose 7 lbs in the first 6 months to continue with counseling)   ASK: We discussed the diagnosis of obesity with Katie Harris today and Katie Harris agreed to give [name] permission to discuss obesity behavioral modification therapy today.  ASSESS: Katie Harris has the diagnosis of obesity and her BMI today is 52.4 Katie Harris is in the action stage of change   ADVISE: Katie Harris was educated on the multiple health risks of obesity as well as the benefit of weight loss to improve her health. She was advised of the need for long term treatment and the importance of lifestyle modifications.  AGREE: Multiple dietary modification options and treatment options were discussed and  Katie Harris agreed to the above obesity treatment plan.   Corey Skains, am acting as transcriptionist for [employer], PA-C I, Lacy Duverney [facility], have reviewed this note and agree with its content

## 2017-08-06 ENCOUNTER — Ambulatory Visit (INDEPENDENT_AMBULATORY_CARE_PROVIDER_SITE_OTHER): Payer: Managed Care, Other (non HMO) | Admitting: Physician Assistant

## 2017-08-19 ENCOUNTER — Ambulatory Visit (INDEPENDENT_AMBULATORY_CARE_PROVIDER_SITE_OTHER): Payer: Managed Care, Other (non HMO) | Admitting: Family Medicine

## 2017-08-19 VITALS — BP 116/72 | HR 67 | Temp 97.5°F | Ht 69.0 in | Wt 352.0 lb

## 2017-08-19 DIAGNOSIS — E559 Vitamin D deficiency, unspecified: Secondary | ICD-10-CM

## 2017-08-19 DIAGNOSIS — Z6841 Body Mass Index (BMI) 40.0 and over, adult: Secondary | ICD-10-CM

## 2017-08-19 DIAGNOSIS — E1165 Type 2 diabetes mellitus with hyperglycemia: Secondary | ICD-10-CM

## 2017-08-19 MED ORDER — VITAMIN D (ERGOCALCIFEROL) 1.25 MG (50000 UNIT) PO CAPS: 50000.0000 [IU] | ORAL_CAPSULE | ORAL | 0 refills | Status: DC

## 2017-08-19 MED ORDER — METFORMIN HCL 500 MG PO TABS: 500.0000 mg | ORAL_TABLET | Freq: Two times a day (BID) | ORAL | 0 refills | Status: DC

## 2017-08-19 NOTE — Progress Notes (Signed)
Office: [phone]  /  Fax: [phone]   HPI:   Chief Complaint: OBESITY Katie Harris is here to discuss her progress with her obesity treatment plan. She is on the Category 3 plan and is following her eating plan approximately 60 % of the time. She states she is exercising 0 minutes 0 times per week. Katie Harris continues to lose weight, but she has had extra challenges with sick family and is struggling to plan ahead. She is doing better now, and is back on track. Her weight is (!) 352 lb (159.7 kg) today and has had a weight loss of 3 pounds over a period of 4 weeks since her last visit. She has lost 10 lbs since starting treatment with [name].  Diabetes II Katie Harris has a diagnosis of diabetes type II. Last A1c was at 7.4, on metformin and she is doing well with her diet prescription.  She has been working on intensive lifestyle modifications including diet, exercise, and weight loss to help control her blood glucose levels. Katie Harris denies any hypoglycemic events.  Vitamin D deficiency Katie Harris has a diagnosis of vitamin D deficiency. Katie Harris is stable on vit D, but she is not yet at goal. Katie Harris denies nausea, vomiting or muscle weakness.  ALLERGIES: No Known Allergies  MEDICATIONS: Current Outpatient Medications on File Prior to Visit  Medication Sig Dispense Refill  . ACCU-CHEK SOFTCLIX LANCETS lancets 1 each by Other route 2 (two) times daily. Use as instructed 100 each 0  . Blood Glucose Monitoring Suppl (ACCU-CHEK GUIDE) w/Device KIT 1 kit by Does not apply route 2 (two) times daily. 1 kit 0  . DULoxetine (CYMBALTA) 60 MG capsule Take 60 mg by mouth daily.   6  . gabapentin (NEURONTIN) 300 MG capsule Take 2 capsules (600 mg total) by mouth 3 (three) times daily. 270 capsule 5  . glucose blood (ACCU-CHEK GUIDE) test strip 1 each by Other route 2 (two) times daily. Use as instructed 100 each 0  . metoprolol (LOPRESSOR) 50 MG tablet Take 50 mg by mouth 2 (two) times daily.      No current  facility-administered medications on file prior to visit.     PAST MEDICAL HISTORY: Past Medical History:  Diagnosis Date  . Anxiety   . Arthritis    lower back  . Back pain   . 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 Cancer ([place])    basal cell removed from left hairline area  . Depression   . Diabetes mellitus without complication ([place])    recent A1C 6.5 no meds presently  . Factor 5 Leiden mutation, heterozygous ([place]) 04/21/2012   Lab 04/16/12 in view of positive Leiden in her father; no personal hx of thrombosis despite prior pregnancy & C-section 2007  . 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 HTN (hypertension), benign 04/21/2012  . Hypertension   . Hyperthyroidism    levels were abnormal in Dec- no treatment currently  . Hyperthyroidism 04/21/2012  . Infertility, female   . Leg edema   . PCOS (polycystic ovarian syndrome)   . Peripheral vascular disease (HCC)    neuropathy bilateral feet  . Prediabetes   . Sleep apnea     PAST SURGICAL HISTORY: Past Surgical History:  Procedure Laterality Date  . CESAREAN SECTION    . DILATATION & CURETTAGE/HYSTEROSCOPY WITH MYOSURE N/A 10/17/2016   Procedure: DILATATION & CURETTAGE/HYSTEROSCOPY WITH MYOSURE POLYPECTOMY;  Surgeon: Paula Compton, MD;  Location: [facility];  Service: Gynecology;  Laterality: N/A;  myosure rep will be here confirmed on 10/06/16  . DILATION AND CURETTAGE OF UTERUS     polpectomy  . INTRAUTERINE DEVICE (IUD) INSERTION N/A 10/17/2016   Procedure: INTRAUTERINE DEVICE (IUD) INSERTION MIRENA;  Surgeon: Paula Compton, MD;  Location: [facility];  Service: Gynecology;  Laterality: N/A;  . TONSILLECTOMY      SOCIAL HISTORY: Social History   Tobacco Use  . Smoking status: Never Smoker  . Smokeless tobacco: Never Used  Substance Use Topics  . Alcohol use: No  . Drug use: No    FAMILY HISTORY: Family History  Problem Relation Age of Onset  . Hypertension Mother   . Hyperlipidemia  Mother   . Thyroid disease Mother   . Depression Mother   . Anxiety disorder Mother   . Sleep apnea Mother   . Obesity Mother   . Heart disease Father   . Diabetes Father   . Hypertension Father   . Hyperlipidemia Father   . Obesity Father   . Diabetes Maternal Grandmother   . Hyperlipidemia Maternal Grandmother   . Hyperlipidemia Maternal Grandfather   . Hyperlipidemia Paternal Grandmother   . Diabetes Paternal Grandmother   . Cancer Paternal Grandmother   . Heart disease Paternal Grandfather     ROS: Review of Systems  Constitutional: Positive for weight loss.  Gastrointestinal: Negative for nausea and vomiting.  Musculoskeletal:       Negative for muscle weakness  Endo/Heme/Allergies:       Negative for hypoglycemia    PHYSICAL EXAM: Blood pressure 116/72, pulse 67, temperature (!) 97.5 F (36.4 C), temperature source Oral, height 5' 9" (1.753 m), weight (!) 352 lb (159.7 kg), SpO2 100 %. Body mass index is 51.98 kg/m. Physical Exam  Constitutional: She is oriented to person, place, and time. She appears well-developed and well-nourished.  Cardiovascular: Normal rate.  Pulmonary/Chest: Effort normal.  Musculoskeletal: Normal range of motion.  Neurological: She is oriented to person, place, and time.  Skin: Skin is warm and dry.  Psychiatric: She has a normal mood and affect.  Vitals reviewed.   RECENT LABS AND TESTS: BMET    Component Value Date/Time   NA 139 04/23/2017 0757   K 5.3 (H) 04/23/2017 0757   CL 100 04/23/2017 0757   CO2 24 04/23/2017 0757   GLUCOSE 171 (H) 04/23/2017 0757   GLUCOSE 149 (H) 10/09/2016 0950   BUN 9 04/23/2017 0757   CREATININE 0.71 04/23/2017 0757   CALCIUM 9.1 04/23/2017 0757   GFRNONAA 105 04/23/2017 0757   GFRAA 121 04/23/2017 0757   Lab Results  Component Value Date   HGBA1C 7.4 (H) 04/23/2017   Lab Results  Component Value Date   INSULIN 43.4 (H) 04/23/2017   CBC    Component Value Date/Time   WBC 8.1  04/23/2017 0757   WBC 8.6 10/09/2016 0950   RBC 4.47 04/23/2017 0757   RBC 4.26 10/09/2016 0950   HGB 13.1 04/23/2017 0757   HCT 39.3 04/23/2017 0757   PLT 294 10/09/2016 0950   MCV 88 04/23/2017 0757   MCH 29.3 04/23/2017 0757   MCH 30.5 10/09/2016 0950   MCHC 33.3 04/23/2017 0757   MCHC 33.8 10/09/2016 0950   RDW 14.2 04/23/2017 0757   LYMPHSABS 2.5 04/23/2017 0757   MONOABS 0.5 07/09/2016 1233   EOSABS 0.2 04/23/2017 0757   BASOSABS 0.1 04/23/2017 0757   Iron/TIBC/Ferritin/ %Sat No results found for: IRON, TIBC, FERRITIN, IRONPCTSAT Lipid Panel  Component Value Date/Time   CHOL 234 (H) 04/23/2017 0757   TRIG 147 04/23/2017 0757   HDL 38 (L) 04/23/2017 0757   LDLCALC 167 (H) 04/23/2017 0757   Hepatic Function Panel     Component Value Date/Time   PROT 7.1 04/23/2017 0757   ALBUMIN 4.2 04/23/2017 0757   AST 13 04/23/2017 0757   ALT 16 04/23/2017 0757   ALKPHOS 87 04/23/2017 0757   BILITOT 0.4 04/23/2017 0757      Component Value Date/Time   TSH 4.780 (H) 04/23/2017 0757   TSH 4.260 01/06/2017 1046   TSH 2.78 07/29/2016 1655   Results for ADREAN, FINDLAY (MRN [id number]) as of 08/19/2017 15:42  Ref. Range 04/23/2017 07:57  Vitamin D, 25-Hydroxy Latest Ref Range: 30.0 - 100.0 ng/mL 19.8 (L)   ASSESSMENT AND PLAN: Type 2 diabetes mellitus with hyperglycemia, without long-term current use of insulin (HCC) - Plan: metFORMIN (GLUCOPHAGE) 500 MG tablet  Vitamin D deficiency - Plan: Vitamin D, Ergocalciferol, (DRISDOL) 50000 units CAPS capsule  Class 3 severe obesity with serious comorbidity and body mass index (BMI) of 50.0 to 59.9 in adult, unspecified obesity type ([place])  PLAN:  Diabetes II Katie Harris has been given extensive diabetes education by myself today including ideal fasting and post-prandial blood glucose readings, individual ideal Hgb A1c goals and hypoglycemia prevention. We discussed the importance of good blood sugar control to decrease the likelihood of  diabetic complications such as nephropathy, neuropathy, limb loss, blindness, coronary artery disease, and death. We discussed the importance of intensive lifestyle modification including diet, exercise and weight loss as the first line treatment for diabetes. Katie Harris agrees to continue metformin 500 mg BID #60 with no refills and follow up at the agreed upon time.  Vitamin D Deficiency Katie Harris was informed that low vitamin D levels contributes to fatigue and are associated with obesity, breast, and colon cancer. She agrees to continue to take prescription Vit D _0 ,000 IU every week #4 with no refills and will follow up for routine testing of vitamin D, at least 2-3 times per year. She was informed of the risk of over-replacement of vitamin D and agrees to not increase her dose unless she discusses this with [name] first. Katie Harris agrees to follow up as directed.  Obesity Katie Harris is currently in the action stage of change. As such, her goal is to continue with weight loss efforts She has agreed to follow the Category 3 plan Katie Harris has been instructed to work up to a goal of 150 minutes of combined cardio and strengthening exercise per week for weight loss and overall health benefits. We discussed the following Behavioral Modification Strategies today: increasing lean protein intake and decreasing simple carbohydrates   Katie Harris has agreed to follow up with our clinic in 3 weeks. She was informed of the importance of frequent follow up visits to maximize her success with intensive lifestyle modifications for her multiple health conditions.   OBESITY BEHAVIORAL INTERVENTION VISIT  Today's visit was # 7 out of 22.  Starting weight: 362 lbs Starting date: 04/22/17 Today's weight : 352 lbs Today's date: 08/19/2017 Total lbs lost to date: 10 (Patients must lose 7 lbs in the first 6 months to continue with counseling)   ASK: We discussed the diagnosis of obesity with Katie Harris today and Katie Harris agreed to give  [name] permission to discuss obesity behavioral modification therapy today.  ASSESS: Katie Harris has the diagnosis of obesity and her BMI today is 51.96 Katie Harris is in the action  stage of change   ADVISE: Katie Harris was educated on the multiple health risks of obesity as well as the benefit of weight loss to improve her health. She was advised of the need for long term treatment and the importance of lifestyle modifications.  AGREE: Multiple dietary modification options and treatment options were discussed and  Katie Harris agreed to the above obesity treatment plan.  I, Doreene Nest, am acting as transcriptionist for Dennard Nip, MD  I have reviewed the above documentation for accuracy and completeness, and I agree with the above. -Dennard Nip, MD

## 2017-09-08 ENCOUNTER — Ambulatory Visit (INDEPENDENT_AMBULATORY_CARE_PROVIDER_SITE_OTHER): Payer: Managed Care, Other (non HMO) | Admitting: Family Medicine

## 2017-09-08 VITALS — BP 108/72 | HR 77 | Temp 99.0°F | Ht 69.0 in | Wt 348.6 lb

## 2017-09-08 DIAGNOSIS — E0590 Thyrotoxicosis, unspecified without thyrotoxic crisis or storm: Secondary | ICD-10-CM | POA: Diagnosis not present

## 2017-09-08 DIAGNOSIS — Z6841 Body Mass Index (BMI) 40.0 and over, adult: Secondary | ICD-10-CM

## 2017-09-08 DIAGNOSIS — E782 Mixed hyperlipidemia: Secondary | ICD-10-CM | POA: Diagnosis not present

## 2017-09-08 DIAGNOSIS — Z9189 Other specified personal risk factors, not elsewhere classified: Secondary | ICD-10-CM | POA: Diagnosis not present

## 2017-09-08 DIAGNOSIS — E119 Type 2 diabetes mellitus without complications: Secondary | ICD-10-CM

## 2017-09-08 NOTE — Progress Notes (Signed)
Office: [phone]  /  Fax: [phone]   HPI:   Chief Complaint: OBESITY Katie Harris is here to discuss her progress with her obesity treatment plan. She is on the Category 3 plan and is following her eating plan approximately 70 % of the time. She states she is exercising 0 minutes 0 times per week. Katie Harris did well with weight loss on the category 3 plan. She was under a lot of stress with her grandmother in the hospital, but she did mindful eating. She also reported having a hard time eating all the protein at night. Her weight is (!) 348 lb 9.6 oz (158.1 kg) today and has had a weight loss of 4 pounds over a period of 3 weeks since her last visit. She has lost 14 lbs since starting treatment with [name].  Diabetes II Katie Harris has a diagnosis of diabetes type II and is on metformin currently. Katie Harris denies any hypoglycemic episodes, nausea, vomiting or diarrhea. Last A1c was at 7.4 She has been working on intensive lifestyle modifications including diet, exercise, and weight loss to help control her blood glucose levels.  Hyperlipidemia Katie Harris has hyperlipidemia. On her last labs, LDL was elevated and HDL was low. Katie Harris has been trying to improve her cholesterol levels with intensive lifestyle modification including a low saturated fat diet, and weight loss. She is not exercising currently. She denies any chest pain, claudication or myalgias.  At risk for cardiovascular disease Katie Harris is at a higher than average risk for cardiovascular disease due to obesity, diabetes and hyperlipidemia. She currently denies any chest pain.  Hyperthyroidism Katie Harris has a diagnosis of hyperthyroidism. Most recent TSH was at 4.780 status post radiation iodine in 2017. She is not currently on medications.   ALLERGIES: No Known Allergies  MEDICATIONS: Current Outpatient Medications on File Prior to Visit  Medication Sig Dispense Refill  . ACCU-CHEK SOFTCLIX LANCETS lancets 1 each by Other route 2 (two) times daily.  Use as instructed 100 each 0  . Blood Glucose Monitoring Suppl (ACCU-CHEK GUIDE) w/Device KIT 1 kit by Does not apply route 2 (two) times daily. 1 kit 0  . DULoxetine (CYMBALTA) 60 MG capsule Take 60 mg by mouth daily.   6  . gabapentin (NEURONTIN) 300 MG capsule Take 2 capsules (600 mg total) by mouth 3 (three) times daily. 270 capsule 5  . glucose blood (ACCU-CHEK GUIDE) test strip 1 each by Other route 2 (two) times daily. Use as instructed 100 each 0  . metFORMIN (GLUCOPHAGE) 500 MG tablet Take 1 tablet (500 mg total) by mouth 2 (two) times daily with a meal. 60 tablet 0  . metoprolol (LOPRESSOR) 50 MG tablet Take 50 mg by mouth 2 (two) times daily.     . Vitamin D, Ergocalciferol, (DRISDOL) 50000 units CAPS capsule Take 1 capsule (50,000 Units total) by mouth every 7 (seven) days. 4 capsule 0   No current facility-administered medications on file prior to visit.     PAST MEDICAL HISTORY: Past Medical History:  Diagnosis Date  . Anxiety   . Arthritis    lower back  . Back pain   . 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 Cancer ([place])    basal cell removed from left hairline area  . Depression   . Diabetes mellitus without complication ([place])    recent A1C 6.5 no meds presently  . Factor 5 Leiden mutation, heterozygous ([place]) 04/21/2012   Lab 04/16/12 in view of positive Leiden in  her father; no personal hx of thrombosis despite prior pregnancy & C-section 2007  . 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 HTN (hypertension), benign 04/21/2012  . Hypertension   . Hyperthyroidism    levels were abnormal in Dec- no treatment currently  . Hyperthyroidism 04/21/2012  . Infertility, female   . Leg edema   . PCOS (polycystic ovarian syndrome)   . Peripheral vascular disease (HCC)    neuropathy bilateral feet  . Prediabetes   . Sleep apnea     PAST SURGICAL HISTORY: Past Surgical History:  Procedure Laterality Date  . CESAREAN SECTION    . DILATATION &  CURETTAGE/HYSTEROSCOPY WITH MYOSURE N/A 10/17/2016   Procedure: DILATATION & CURETTAGE/HYSTEROSCOPY WITH MYOSURE POLYPECTOMY;  Surgeon: Paula Compton, MD;  Location: [facility];  Service: Gynecology;  Laterality: N/A;  myosure rep will be here confirmed on 10/06/16  . DILATION AND CURETTAGE OF UTERUS     polpectomy  . INTRAUTERINE DEVICE (IUD) INSERTION N/A 10/17/2016   Procedure: INTRAUTERINE DEVICE (IUD) INSERTION MIRENA;  Surgeon: Paula Compton, MD;  Location: [facility];  Service: Gynecology;  Laterality: N/A;  . TONSILLECTOMY      SOCIAL HISTORY: Social History   Tobacco Use  . Smoking status: Never Smoker  . Smokeless tobacco: Never Used  Substance Use Topics  . Alcohol use: No  . Drug use: No    FAMILY HISTORY: Family History  Problem Relation Age of Onset  . Hypertension Mother   . Hyperlipidemia Mother   . Thyroid disease Mother   . Depression Mother   . Anxiety disorder Mother   . Sleep apnea Mother   . Obesity Mother   . Heart disease Father   . Diabetes Father   . Hypertension Father   . Hyperlipidemia Father   . Obesity Father   . Diabetes Maternal Grandmother   . Hyperlipidemia Maternal Grandmother   . Hyperlipidemia Maternal Grandfather   . Hyperlipidemia Paternal Grandmother   . Diabetes Paternal Grandmother   . Cancer Paternal Grandmother   . Heart disease Paternal Grandfather     ROS: Review of Systems  Constitutional: Positive for weight loss.  Cardiovascular: Negative for chest pain and claudication.  Gastrointestinal: Negative for diarrhea, nausea and vomiting.  Musculoskeletal: Negative for myalgias.  Endo/Heme/Allergies:       Negative for hypoglycemia  Psychiatric/Behavioral:       Positive for Stress    PHYSICAL EXAM: Blood pressure 108/72, pulse 77, temperature 99 F (37.2 C), temperature source Oral, height '5\' 9"'$  (1.753 m), weight (!) 348 lb 9.6 oz (158.1 kg), SpO2 97 %. Body mass index is 51.48 kg/m. Physical Exam    Constitutional: She is oriented to person, place, and time. She appears well-developed and well-nourished.  Cardiovascular: Normal rate.  Pulmonary/Chest: Effort normal.  Musculoskeletal: Normal range of motion.  Neurological: She is oriented to person, place, and time.  Skin: Skin is warm and dry.  Psychiatric: She has a normal mood and affect. Her behavior is normal.  Vitals reviewed.   RECENT LABS AND TESTS: BMET    Component Value Date/Time   NA 139 04/23/2017 0757   K 5.3 (H) 04/23/2017 0757   CL 100 04/23/2017 0757   CO2 24 04/23/2017 0757   GLUCOSE 171 (H) 04/23/2017 0757   GLUCOSE 149 (H) 10/09/2016 0950   BUN 9 04/23/2017 0757   CREATININE 0.71 04/23/2017 0757   CALCIUM 9.1 04/23/2017 0757   GFRNONAA 105 04/23/2017 0757   GFRAA 121  04/23/2017 0757   Lab Results  Component Value Date   HGBA1C 7.4 (H) 04/23/2017   Lab Results  Component Value Date   INSULIN 43.4 (H) 04/23/2017   CBC    Component Value Date/Time   WBC 8.1 04/23/2017 0757   WBC 8.6 10/09/2016 0950   RBC 4.47 04/23/2017 0757   RBC 4.26 10/09/2016 0950   HGB 13.1 04/23/2017 0757   HCT 39.3 04/23/2017 0757   PLT 294 10/09/2016 0950   MCV 88 04/23/2017 0757   MCH 29.3 04/23/2017 0757   MCH 30.5 10/09/2016 0950   MCHC 33.3 04/23/2017 0757   MCHC 33.8 10/09/2016 0950   RDW 14.2 04/23/2017 0757   LYMPHSABS 2.5 04/23/2017 0757   MONOABS 0.5 07/09/2016 1233   EOSABS 0.2 04/23/2017 0757   BASOSABS 0.1 04/23/2017 0757   Iron/TIBC/Ferritin/ %Sat No results found for: IRON, TIBC, FERRITIN, IRONPCTSAT Lipid Panel     Component Value Date/Time   CHOL 234 (H) 04/23/2017 0757   TRIG 147 04/23/2017 0757   HDL 38 (L) 04/23/2017 0757   LDLCALC 167 (H) 04/23/2017 0757   Hepatic Function Panel     Component Value Date/Time   PROT 7.1 04/23/2017 0757   ALBUMIN 4.2 04/23/2017 0757   AST 13 04/23/2017 0757   ALT 16 04/23/2017 0757   ALKPHOS 87 04/23/2017 0757   BILITOT 0.4 04/23/2017 0757       Component Value Date/Time   TSH 4.780 (H) 04/23/2017 0757   TSH 4.260 01/06/2017 1046   TSH 2.78 07/29/2016 1655   Results for Katie, Harris (MRN [id number]) as of 09/08/2017 11:13  Ref. Range 04/23/2017 07:57  Vitamin D, 25-Hydroxy Latest Ref Range: 30.0 - 100.0 ng/mL 19.8 (L)   ASSESSMENT AND PLAN: Type 2 diabetes mellitus without complication, without long-term current use of insulin (HCC) - Plan: Hemoglobin A1c, Insulin, random  Mixed hyperlipidemia - Plan: Comprehensive metabolic panel, Lipid panel  Hyperthyroidism - Plan: Comprehensive metabolic panel, VITAMIN D 25 Hydroxy (Vit-D Deficiency, Fractures), T3, T4, free, TSH  At risk for heart disease  Class 3 severe obesity with serious comorbidity and body mass index (BMI) of 50.0 to 59.9 in adult, unspecified obesity type (HCC)  PLAN:  Diabetes II Katie Harris has been given extensive diabetes education by myself today including ideal fasting and post-prandial blood glucose readings, individual ideal Hgb A1c goals and hypoglycemia prevention. We discussed the importance of good blood sugar control to decrease the likelihood of diabetic complications such as nephropathy, neuropathy, limb loss, blindness, coronary artery disease, and death. We discussed the importance of intensive lifestyle modification including diet, exercise and weight loss as the first line treatment for diabetes. Shenica agrees to continue metformin, weight loss efforts and diet. We will check labs and she will follow up at the agreed upon time.  Hyperlipidemia Katie Harris was informed of the American Heart Association Guidelines emphasizing intensive lifestyle modifications as the first line treatment for hyperlipidemia. We discussed many lifestyle modifications today in depth, and Katie Harris will continue to work on decreasing saturated fats such as fatty red meat, butter and many fried foods. She will also increase vegetables and lean protein in her diet and continue to work on  exercise and weight loss efforts. We will check labs today and Katie Harris will follow up as directed.  Cardiovascular risk counseling Katie Harris was given extended (15 minutes) coronary artery disease prevention counseling today. She is 42 y.o. female and has risk factors for heart disease including obesity, diabetes and hyperlipidemia. We discussed  intensive lifestyle modifications today with an emphasis on specific weight loss instructions and strategies. Pt was also informed of the importance of increasing exercise and decreasing saturated fats to help prevent heart disease.  Hyperthyroidism Katie Harris was informed of the importance of good thyroid control to help with weight loss efforts. We will recheck thyroid panel and Katie Harris agrees to follow up as directed.  Obesity Katie Harris is currently in the action stage of change. As such, her goal is to continue with weight loss efforts She has agreed to follow the Category 3 plan  Katie Harris has been instructed to work up to a goal of 150 minutes of combined cardio and strengthening exercise per week for weight loss and overall health benefits. We discussed the following Behavioral Modification Strategies today: break up protein from dinner and eat throughout the day, increasing lean protein intake and work on meal planning and easy cooking plans   Katie Harris has agreed to follow up with our clinic in 2 to 3 weeks. She was informed of the importance of frequent follow up visits to maximize her success with intensive lifestyle modifications for her multiple health conditions.   OBESITY BEHAVIORAL INTERVENTION VISIT  Today's visit was # 8 out of 22.  Starting weight: 362 lbs Starting date: 04/22/17 Today's weight : 348 lbs  Today's date: 09/08/2017 Total lbs lost to date: 14 (Patients must lose 7 lbs in the first 6 months to continue with counseling)   ASK: We discussed the diagnosis of obesity with Katie Harris today and Rashaun agreed to give [name] permission to discuss  obesity behavioral modification therapy today.  ASSESS: Nikeia has the diagnosis of obesity and her BMI today is 51.46 Zykeria is in the action stage of change   ADVISE: Earline was educated on the multiple health risks of obesity as well as the benefit of weight loss to improve her health. She was advised of the need for long term treatment and the importance of lifestyle modifications.  AGREE: Multiple dietary modification options and treatment options were discussed and  Josephyne agreed to the above obesity treatment plan.  I, Doreene Nest, am acting as transcriptionist for Dennard Nip, MD  I have reviewed the above documentation for accuracy and completeness, and I agree with the above. -Dennard Nip, MD

## 2017-09-09 LAB — COMPREHENSIVE METABOLIC PANEL
ALK PHOS: 91 IU/L (ref 39–117)
ALT: 13 IU/L (ref 0–32)
AST: 12 IU/L (ref 0–40)
Albumin/Globulin Ratio: 1.4 (ref 1.2–2.2)
Albumin: 4.1 g/dL (ref 3.5–5.5)
BUN/Creatinine Ratio: 17 (ref 9–23)
BUN: 13 mg/dL (ref 6–24)
Bilirubin Total: 0.4 mg/dL (ref 0.0–1.2)
CO2: 23 mmol/L (ref 20–29)
CREATININE: 0.78 mg/dL (ref 0.57–1.00)
Calcium: 9.3 mg/dL (ref 8.7–10.2)
Chloride: 100 mmol/L (ref 96–106)
GFR calc Af Amer: 107 mL/min/{1.73_m2} (ref >59)
GFR calc non Af Amer: 93 mL/min/{1.73_m2} (ref >59)
GLUCOSE: 133 mg/dL — AB (ref 65–99)
Globulin, Total: 2.9 g/dL (ref 1.5–4.5)
Potassium: 4.4 mmol/L (ref 3.5–5.2)
Sodium: 137 mmol/L (ref 134–144)
Total Protein: 7 g/dL (ref 6.0–8.5)

## 2017-09-09 LAB — HEMOGLOBIN A1C
Est. average glucose Bld gHb Est-mCnc: 123 mg/dL
HEMOGLOBIN A1C: 5.9 % — AB (ref 4.8–5.6)

## 2017-09-09 LAB — LIPID PANEL
CHOL/HDL RATIO: 6.3 ratio — AB (ref 0.0–4.4)
Cholesterol, Total: 238 mg/dL — ABNORMAL HIGH (ref 100–199)
HDL: 38 mg/dL — ABNORMAL LOW (ref >39)
LDL Calculated: 168 mg/dL — ABNORMAL HIGH (ref 0–99)
TRIGLYCERIDES: 161 mg/dL — AB (ref 0–149)
VLDL Cholesterol Cal: 32 mg/dL (ref 5–40)

## 2017-09-09 LAB — T4, FREE: FREE T4: 0.9 ng/dL (ref 0.82–1.77)

## 2017-09-09 LAB — T3: T3, Total: 106 ng/dL (ref 71–180)

## 2017-09-09 LAB — INSULIN, RANDOM: INSULIN: 22.9 u[IU]/mL (ref 2.6–24.9)

## 2017-09-09 LAB — VITAMIN D 25 HYDROXY (VIT D DEFICIENCY, FRACTURES): Vit D, 25-Hydroxy: 28 ng/mL — ABNORMAL LOW (ref 30.0–100.0)

## 2017-09-09 LAB — TSH: TSH: 6.83 u[IU]/mL — ABNORMAL HIGH (ref 0.450–4.500)

## 2017-09-29 ENCOUNTER — Ambulatory Visit (INDEPENDENT_AMBULATORY_CARE_PROVIDER_SITE_OTHER): Payer: Managed Care, Other (non HMO) | Admitting: Family Medicine

## 2017-09-29 VITALS — BP 131/82 | HR 78 | Temp 98.3°F | Ht 69.0 in | Wt 349.0 lb

## 2017-09-29 DIAGNOSIS — E119 Type 2 diabetes mellitus without complications: Secondary | ICD-10-CM

## 2017-09-29 DIAGNOSIS — Z6841 Body Mass Index (BMI) 40.0 and over, adult: Secondary | ICD-10-CM

## 2017-09-29 DIAGNOSIS — E559 Vitamin D deficiency, unspecified: Secondary | ICD-10-CM | POA: Diagnosis not present

## 2017-09-29 DIAGNOSIS — Z9189 Other specified personal risk factors, not elsewhere classified: Secondary | ICD-10-CM | POA: Diagnosis not present

## 2017-09-29 MED ORDER — VITAMIN D (ERGOCALCIFEROL) 1.25 MG (50000 UNIT) PO CAPS: 50000.0000 [IU] | ORAL_CAPSULE | ORAL | 0 refills | Status: DC

## 2017-09-29 MED ORDER — METFORMIN HCL 500 MG PO TABS: 500.0000 mg | ORAL_TABLET | Freq: Two times a day (BID) | ORAL | 0 refills | Status: DC

## 2017-09-29 MED ORDER — GLUCOSE BLOOD VI STRP: 1.0000 | ORAL_STRIP | Freq: Two times a day (BID) | 0 refills | Status: DC

## 2017-09-29 NOTE — Progress Notes (Signed)
Office: [phone]  /  Fax: [phone]   HPI:   Chief Complaint: OBESITY Katie Harris is here to discuss her progress with her obesity treatment plan. She is on the Category 3 plan and is following her eating plan approximately 50 % of the time. She states she is walking for 20-30 minutes 5 times per week. Katie Harris had difficult time following Category 3 plan due to family stress. She is ready to get back on track.  Her weight is (!) 349 lb (158.3 kg) today and has gained 1 pound since her last visit. She has lost 13 lbs since starting treatment with Katie Harris.  Diabetes II Edelin has a diagnosis of diabetes type II. Breelynn states fasting BGs range between 113 and 153 with average glucose of 130 fasting. She denies hypoglycemia. Most recent A1c much improved. She has been working on intensive lifestyle modifications including diet, exercise, and weight loss to help control her blood glucose levels.  Vitamin Deficiency Man has a diagnosis of vitamin D deficiency. She is on prescription Vit D, last level not at goal. She denies nausea, vomiting or muscle weakness.  At risk for osteopenia and osteoporosis Chole is at higher risk of osteopenia and osteoporosis due to vitamin D deficiency.   ALLERGIES: No Known Allergies  MEDICATIONS: Current Outpatient Medications on File Prior to Visit  Medication Sig Dispense Refill  . ACCU-CHEK SOFTCLIX LANCETS lancets 1 each by Other route 2 (two) times daily. Use as instructed 100 each 0  . Blood Glucose Monitoring Suppl (ACCU-CHEK GUIDE) w/Device KIT 1 kit by Does not apply route 2 (two) times daily. 1 kit 0  . DULoxetine (CYMBALTA) 60 MG capsule Take 60 mg by mouth daily.   6  . gabapentin (NEURONTIN) 300 MG capsule Take 2 capsules (600 mg total) by mouth 3 (three) times daily. 270 capsule 5  . metoprolol (LOPRESSOR) 50 MG tablet Take 50 mg by mouth 2 (two) times daily.      No current facility-administered medications on file prior to visit.     PAST  MEDICAL HISTORY: Past Medical History:  Diagnosis Date  . Anxiety   . Arthritis    lower back  . Back pain   . 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 Cancer ([place])    basal cell removed from left hairline area  . Depression   . Diabetes mellitus without complication ([place])    recent A1C 6.5 no meds presently  . Factor 5 Leiden mutation, heterozygous ([place]) 04/21/2012   Lab 04/16/12 in view of positive Leiden in her father; no personal hx of thrombosis despite prior pregnancy & C-section 2007  . 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 HTN (hypertension), benign 04/21/2012  . Hypertension   . Hyperthyroidism    levels were abnormal in Dec- no treatment currently  . Hyperthyroidism 04/21/2012  . Infertility, female   . Leg edema   . PCOS (polycystic ovarian syndrome)   . Peripheral vascular disease (HCC)    neuropathy bilateral feet  . Prediabetes   . Sleep apnea     PAST SURGICAL HISTORY: Past Surgical History:  Procedure Laterality Date  . CESAREAN SECTION    . DILATATION & CURETTAGE/HYSTEROSCOPY WITH MYOSURE N/A 10/17/2016   Procedure: DILATATION & CURETTAGE/HYSTEROSCOPY WITH MYOSURE POLYPECTOMY;  Surgeon: Paula Compton, MD;  Location: [facility];  Service: Gynecology;  Laterality: N/A;  myosure rep will be here confirmed on 10/06/16  . DILATION AND CURETTAGE  OF UTERUS     polpectomy  . INTRAUTERINE DEVICE (IUD) INSERTION N/A 10/17/2016   Procedure: INTRAUTERINE DEVICE (IUD) INSERTION MIRENA;  Surgeon: Paula Compton, MD;  Location: [facility];  Service: Gynecology;  Laterality: N/A;  . TONSILLECTOMY      SOCIAL HISTORY: Social History   Tobacco Use  . Smoking status: Never Smoker  . Smokeless tobacco: Never Used  Substance Use Topics  . Alcohol use: No  . Drug use: No    FAMILY HISTORY: Family History  Problem Relation Age of Onset  . Hypertension Mother   . Hyperlipidemia Mother   . Thyroid disease Mother   . Depression Mother   . Anxiety  disorder Mother   . Sleep apnea Mother   . Obesity Mother   . Heart disease Father   . Diabetes Father   . Hypertension Father   . Hyperlipidemia Father   . Obesity Father   . Diabetes Maternal Grandmother   . Hyperlipidemia Maternal Grandmother   . Hyperlipidemia Maternal Grandfather   . Hyperlipidemia Paternal Grandmother   . Diabetes Paternal Grandmother   . Cancer Paternal Grandmother   . Heart disease Paternal Grandfather     ROS: Review of Systems  Constitutional: Negative for weight loss.  Gastrointestinal: Negative for nausea and vomiting.  Musculoskeletal:       Negative muscle weakness  Endo/Heme/Allergies:       Negative hypoglycemia    PHYSICAL EXAM: Blood pressure 131/82, pulse 78, temperature 98.3 F (36.8 C), temperature source Oral, height '5\' 9"'$  (1.753 m), weight (!) 349 lb (158.3 kg), SpO2 100 %. Body mass index is 51.54 kg/m. Physical Exam  Constitutional: She is oriented to person, place, and time. She appears well-developed and well-nourished.  Cardiovascular: Normal rate.  Pulmonary/Chest: Effort normal.  Musculoskeletal: Normal range of motion.  Neurological: She is oriented to person, place, and time.  Skin: Skin is warm and dry.  Psychiatric: She has a normal mood and affect. Her behavior is normal.  Vitals reviewed.   RECENT LABS AND TESTS: BMET    Component Value Date/Time   NA 137 09/08/2017 1021   K 4.4 09/08/2017 1021   CL 100 09/08/2017 1021   CO2 23 09/08/2017 1021   GLUCOSE 133 (H) 09/08/2017 1021   GLUCOSE 149 (H) 10/09/2016 0950   BUN 13 09/08/2017 1021   CREATININE 0.78 09/08/2017 1021   CALCIUM 9.3 09/08/2017 1021   GFRNONAA 93 09/08/2017 1021   GFRAA 107 09/08/2017 1021   Lab Results  Component Value Date   HGBA1C 5.9 (H) 09/08/2017   HGBA1C 7.4 (H) 04/23/2017   Lab Results  Component Value Date   INSULIN 22.9 09/08/2017   INSULIN 43.4 (H) 04/23/2017   CBC    Component Value Date/Time   WBC 8.1 04/23/2017  0757   WBC 8.6 10/09/2016 0950   RBC 4.47 04/23/2017 0757   RBC 4.26 10/09/2016 0950   HGB 13.1 04/23/2017 0757   HCT 39.3 04/23/2017 0757   PLT 294 10/09/2016 0950   MCV 88 04/23/2017 0757   MCH 29.3 04/23/2017 0757   MCH 30.5 10/09/2016 0950   MCHC 33.3 04/23/2017 0757   MCHC 33.8 10/09/2016 0950   RDW 14.2 04/23/2017 0757   LYMPHSABS 2.5 04/23/2017 0757   MONOABS 0.5 07/09/2016 1233   EOSABS 0.2 04/23/2017 0757   BASOSABS 0.1 04/23/2017 0757   Iron/TIBC/Ferritin/ %Sat No results found for: IRON, TIBC, FERRITIN, IRONPCTSAT Lipid Panel     Component Value Date/Time   CHOL  238 (H) 09/08/2017 1021   TRIG 161 (H) 09/08/2017 1021   HDL 38 (L) 09/08/2017 1021   CHOLHDL 6.3 (H) 09/08/2017 1021   LDLCALC 168 (H) 09/08/2017 1021   Hepatic Function Panel     Component Value Date/Time   PROT 7.0 09/08/2017 1021   ALBUMIN 4.1 09/08/2017 1021   AST 12 09/08/2017 1021   ALT 13 09/08/2017 1021   ALKPHOS 91 09/08/2017 1021   BILITOT 0.4 09/08/2017 1021      Component Value Date/Time   TSH 6.830 (H) 09/08/2017 1021   TSH 4.780 (H) 04/23/2017 0757   TSH 4.260 01/06/2017 1046  Results for KAYAL, MULA (MRN [id number]) as of 09/29/2017 15:38  Ref. Range 09/08/2017 10:21  Vitamin D, 25-Hydroxy Latest Ref Range: 30.0 - 100.0 ng/mL 28.0 (L)    ASSESSMENT AND PLAN: Vitamin D deficiency - Plan: Vitamin D, Ergocalciferol, (DRISDOL) 50000 units CAPS capsule  Type 2 diabetes mellitus without complication, without long-term current use of insulin (HCC) - Plan: metFORMIN (GLUCOPHAGE) 500 MG tablet, glucose blood (ACCU-CHEK GUIDE) test strip  At risk for osteoporosis  Class 3 severe obesity with serious comorbidity and body mass index (BMI) of 50.0 to 59.9 in adult, unspecified obesity type ([place])  PLAN:  Diabetes II Lorie has been given extensive diabetes education by myself today including ideal fasting and post-prandial blood glucose readings, individual ideal Hgb A1c goals and  hypoglycemia prevention. We discussed the importance of good blood sugar control to decrease the likelihood of diabetic complications such as nephropathy, neuropathy, limb loss, blindness, coronary artery disease, and death. We discussed the importance of intensive lifestyle modification including diet, exercise and weight loss as the first line treatment for diabetes. Criss agrees to continue taking metformin 500 mg BID #60 and we will refill for 1 month and we will refill glucose test strips. Gerda agrees to follow up with our clinic in 2 to 3 weeks.  Vitamin D Deficiency Chamille was informed that low vitamin D levels contributes to fatigue and are associated with obesity, breast, and colon cancer. Amani agrees to continue taking prescription Vit D '@50'$ ,000 IU every week #4 and we will refill for 1 month. She will follow up for routine testing of vitamin D, at least 2-3 times per year. She was informed of the risk of over-replacement of vitamin D and agrees to not increase her dose unless she discusses this with Katie Harris first. Candi agrees to follow up with our clinic in 2 to 3 weeks.  At risk for osteopenia and osteoporosis Marji is at risk for osteopenia and osteoporsis due to her vitamin D deficiency. She was encouraged to take her vitamin D and follow her higher calcium diet and increase strengthening exercise to help strengthen her bones and decrease her risk of osteopenia and osteoporosis.  Obesity Rheba is currently in the action stage of change. As such, her goal is to continue with weight loss efforts She has agreed to follow the Category 3 plan Jamia has been instructed to work up to a goal of 150 minutes of combined cardio and strengthening exercise per week for weight loss and overall health benefits. We discussed the following Behavioral Modification Strategies today: increasing lean protein intake and work on meal planning and easy cooking plans   Shelba has agreed to follow up with our clinic  in 2 to 3 weeks. She was informed of the importance of frequent follow up visits to maximize her success with intensive lifestyle modifications for her multiple health  conditions.   OBESITY BEHAVIORAL INTERVENTION VISIT  Today's visit was # 9 out of 22.  Starting weight: 362 lbs Starting date: 04/22/17 Today's weight : 349 lbs  Today's date: 09/29/2017 Total lbs lost to date: 13    ASK: We discussed the diagnosis of obesity with [name] today and Niharika agreed to give Katie Harris permission to discuss obesity behavioral modification therapy today.  ASSESS: Jeronica has the diagnosis of obesity and her BMI today is 51.51 Lazariah is in the action stage of change   ADVISE: Leonie was educated on the multiple health risks of obesity as well as the benefit of weight loss to improve her health. She was advised of the need for long term treatment and the importance of lifestyle modifications.  AGREE: Multiple dietary modification options and treatment options were discussed and  Bradee agreed to the above obesity treatment plan.  I, Trixie Dredge, am acting as transcriptionist for Dennard Nip, MD  I have reviewed the above documentation for accuracy and completeness, and I agree with the above. -Dennard Nip, MD

## 2017-10-20 ENCOUNTER — Ambulatory Visit (INDEPENDENT_AMBULATORY_CARE_PROVIDER_SITE_OTHER): Payer: Self-pay | Admitting: Family Medicine

## 2017-10-30 ENCOUNTER — Encounter (INDEPENDENT_AMBULATORY_CARE_PROVIDER_SITE_OTHER): Payer: Self-pay | Admitting: Family Medicine

## 2017-11-02 NOTE — Telephone Encounter (Signed)
Please r/s

## 2017-11-03 ENCOUNTER — Ambulatory Visit (INDEPENDENT_AMBULATORY_CARE_PROVIDER_SITE_OTHER): Payer: Self-pay | Admitting: Family Medicine

## 2017-11-03 ENCOUNTER — Encounter (INDEPENDENT_AMBULATORY_CARE_PROVIDER_SITE_OTHER): Payer: Self-pay

## 2017-11-06 ENCOUNTER — Encounter: Payer: Self-pay | Admitting: Internal Medicine

## 2017-11-06 ENCOUNTER — Ambulatory Visit: Payer: Managed Care, Other (non HMO) | Admitting: Internal Medicine

## 2017-11-06 VITALS — BP 116/78 | HR 74 | Ht 69.0 in | Wt 359.2 lb

## 2017-11-06 DIAGNOSIS — E890 Postprocedural hypothyroidism: Secondary | ICD-10-CM

## 2017-11-06 DIAGNOSIS — E1140 Type 2 diabetes mellitus with diabetic neuropathy, unspecified: Secondary | ICD-10-CM | POA: Diagnosis not present

## 2017-11-06 DIAGNOSIS — E0510 Thyrotoxicosis with toxic single thyroid nodule without thyrotoxic crisis or storm: Secondary | ICD-10-CM | POA: Diagnosis not present

## 2017-11-06 DIAGNOSIS — E119 Type 2 diabetes mellitus without complications: Secondary | ICD-10-CM | POA: Diagnosis not present

## 2017-11-06 MED ORDER — METFORMIN HCL 500 MG PO TABS: 500.0000 mg | ORAL_TABLET | Freq: Two times a day (BID) | ORAL | 0 refills | Status: DC

## 2017-11-06 MED ORDER — LEVOTHYROXINE SODIUM 50 MCG PO TABS: 50.0000 ug | ORAL_TABLET | Freq: Every day | ORAL | 3 refills | Status: DC

## 2017-11-06 NOTE — Progress Notes (Signed)
Patient ID: Katie Harris, female   DOB: 05/08/1975, 42 y.o.   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 Last visit 1 year and 3 months ago.  Patient developed post ablative hypothyroidism since last visit.  Her TFTs started to rise slowly after her RAI treatment.  Latest levels obtained last month showed an elevated TSH and normal free T4.  She mentions that she lost ~30 lbs after starting to work with Dr. Beasley in the [facility]. She then started to feel fatigued, developed food cravings again, and weight increased weight by 10 lbs.   Reviewed and addended history: Pt was diagnosed with thyrotoxicosis in 11/2014. At that time, she was taking several thyrotoxic supplements and also took Biotin.  Before our first visit, she was on biotin, B6 vitamin, Boswellia, but was previously on Hawthorne, [employer] - stopped 3-4 weeks prior to the appt. I advised her to stay off all the supplements and hold the Biotin few days before next TFTs.  TSI antibodies were not elevated.   TFTs continued to fluctuate but she continued to have suppressed TSH and elevated free T3.  Thyroid Uptake and scan which she had on 10/25/2015: Elevated 24 hour radio iodine uptake of 42%.  Large hot nodule within LEFT thyroid lobe with suppression of uptake within the RIGHT lobe consistent with a toxic adenoma.   11/09/2015: RAI treatment  I reviewed patient's TFTs: Lab Results  Component Value Date   TSH 6.830 (H) 09/08/2017   TSH 4.780 (H) 04/23/2017   TSH 4.260 01/06/2017   TSH 2.78 07/29/2016   TSH 2.37 03/31/2016   TSH 0.11 (L) 12/21/2015   TSH 0.03 (L) 10/15/2015   TSH 0.18 (L) 08/31/2015   TSH 0.05 (L) 07/19/2015   FREET4 0.90 09/08/2017   FREET4 0.95 04/23/2017   FREET4 0.72 07/29/2016   FREET4 0.55 (L) 03/31/2016   FREET4 0.89 12/21/2015   FREET4 1.60  10/15/2015   FREET4 1.32 08/31/2015   FREET4 1.52 07/19/2015   T3FREE 3.3 07/29/2016   T3FREE 2.7 03/31/2016   T3FREE 3.8 12/21/2015   T3FREE 4.5 (H) 10/15/2015   T3FREE 5.0 (H) 08/31/2015   T3FREE 5.2 (H) 07/19/2015  11/07/2014: TSH <0.01, fT4 1.54 (0.61-1.18)  TSI antibodies were not elevated: Lab Results  Component Value Date   TSI <89 07/19/2015   She continues on Toprol-XL 50 mg twice a day.  Pt denies: - feeling nodules in neck - hoarseness - dysphagia - choking - SOB with lying down  Pt does have a FH of thyroid ds: hypothyroidism in mother, possibly MGM. No FH of thyroid cancer. No h/o radiation tx to head or neck except for RAI treatment as mentioned above.  No seaweed or kelp. No recent contrast studies. No herbal supplements. No Biotin use. No recent steroids use.   She also has a history of PCOS and pseudotumor cerebri.  She is now diabetic >> started on Metformin 500 mg 2x a day >> latest HbA1c: Lab Results  Component Value Date   HGBA1C 5.9 (H) 09/08/2017   HGBA1C 7.4 (H) 04/23/2017   ROS: Constitutional: + weight gain/no weight loss, no fatigue, no subjective hyperthermia, no subjective hypothermia Eyes: no blurry vision, no xerophthalmia ENT: no sore throat, + see HPI Cardiovascular: no CP/no SOB/no palpitations/no leg swelling Respiratory: no cough/no SOB/no wheezing Gastrointestinal: no N/no V/no D/no   C/no acid reflux Musculoskeletal: no muscle aches/no joint aches Skin: no rashes, no hair loss Neurological: no tremors/no numbness/no tingling/no dizziness, + HA  I reviewed pt's medications, allergies, PMH, social hx, family hx, and changes were documented in the history of present illness. Otherwise, unchanged from my initial visit note.  Past Medical History:  Diagnosis Date  . Anxiety   . Arthritis    lower back  . Back pain   . 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 Cancer (HCC)    basal cell removed from left hairline area  .  Depression   . Diabetes mellitus without complication (HCC)    recent A1C 6.5 no meds presently  . Factor 5 Leiden mutation, heterozygous (HCC) 04/21/2012   Lab 04/16/12 in view of positive Leiden in her father; no personal hx of thrombosis despite prior pregnancy & C-section 2007  . 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 HTN (hypertension), benign 04/21/2012  . Hypertension   . Hyperthyroidism    levels were abnormal in Dec- no treatment currently  . Hyperthyroidism 04/21/2012  . Infertility, female   . Leg edema   . PCOS (polycystic ovarian syndrome)   . Peripheral vascular disease (HCC)    neuropathy bilateral feet  . Prediabetes   . Sleep apnea    Past Surgical History:  Procedure Laterality Date  . CESAREAN SECTION    . DILATATION & CURETTAGE/HYSTEROSCOPY WITH MYOSURE N/A 10/17/2016   Procedure: DILATATION & CURETTAGE/HYSTEROSCOPY WITH MYOSURE POLYPECTOMY;  Surgeon: Richardson, Kathy, MD;  Location: [facility];  Service: Gynecology;  Laterality: N/A;  myosure rep will be here confirmed on 10/06/16  . DILATION AND CURETTAGE OF UTERUS     polpectomy  . INTRAUTERINE DEVICE (IUD) INSERTION N/A 10/17/2016   Procedure: INTRAUTERINE DEVICE (IUD) INSERTION MIRENA;  Surgeon: Richardson, Kathy, MD;  Location: [facility];  Service: Gynecology;  Laterality: N/A;  . TONSILLECTOMY     Social History   Social History  . Marital Status: Married    Spouse Name: N/A  . Number of Children: 1   Occupational History  . Project coordinator   Social History Main Topics  . Smoking status: Never Smoker   . Smokeless tobacco: Not on file  . Alcohol Use: No  . Drug Use: No   Current Outpatient Medications on File Prior to Visit  Medication Sig Dispense Refill  . ACCU-CHEK SOFTCLIX LANCETS lancets 1 each by Other route 2 (two) times daily. Use as instructed 100 each 0  . Blood Glucose Monitoring Suppl (ACCU-CHEK GUIDE) w/Device KIT 1 kit by Does not apply route 2 (two) times daily. 1  kit 0  . DULoxetine (CYMBALTA) 60 MG capsule Take 60 mg by mouth daily.   6  . gabapentin (NEURONTIN) 300 MG capsule Take 2 capsules (600 mg total) by mouth 3 (three) times daily. 270 capsule 5  . glucose blood (ACCU-CHEK GUIDE) test strip 1 each by Other route 2 (two) times daily. Use as instructed 100 each 0  . metFORMIN (GLUCOPHAGE) 500 MG tablet Take 1 tablet (500 mg total) by mouth 2 (two) times daily with a meal. 60 tablet 0  . metoprolol (LOPRESSOR) 50 MG tablet Take 50 mg by mouth 2 (two) times daily.     . Vitamin D, Ergocalciferol, (DRISDOL) 50000 units CAPS capsule Take 1 capsule (50,000 Units total) by mouth every 7 (seven) days. 4 capsule 0   No current facility-administered medications on file prior   to visit.    No Known Allergies Family History  Problem Relation Age of Onset  . Hypertension Mother   . Hyperlipidemia Mother   . Thyroid disease Mother   . Depression Mother   . Anxiety disorder Mother   . Sleep apnea Mother   . Obesity Mother   . Heart disease Father   . Diabetes Father   . Hypertension Father   . Hyperlipidemia Father   . Obesity Father   . Diabetes Maternal Grandmother   . Hyperlipidemia Maternal Grandmother   . Hyperlipidemia Maternal Grandfather   . Hyperlipidemia Paternal Grandmother   . Diabetes Paternal Grandmother   . Cancer Paternal Grandmother   . Heart disease Paternal Grandfather     PE: BP 116/78   Pulse 74   Ht 5' 9" (1.753 m)   Wt (!) 359 lb 3.2 oz (162.9 kg)   SpO2 98%   BMI 53.04 kg/m  Wt Readings from Last 3 Encounters:  11/06/17 (!) 359 lb 3.2 oz (162.9 kg)  09/29/17 (!) 349 lb (158.3 kg)  09/08/17 (!) 348 lb 9.6 oz (158.1 kg)   Constitutional: Obese, in NAD Eyes: PERRLA, EOMI, no exophthalmos ENT: moist mucous membranes, no thyromegaly, no cervical lymphadenopathy Cardiovascular: RRR, No MRG Respiratory: CTA B Gastrointestinal: abdomen soft, NT, ND, BS+ Musculoskeletal: no deformities, strength intact in all  4 Skin: moist, warm, no rashes Neurological: no tremor with outstretched hands, DTR normal in all 4  ASSESSMENT: 1. Toxic thyroid nodule - s/p ablation  2.  Post ablative hypothyroidism  3. DM2  PLAN:  1. Patient with history of thyrotoxicosis, with a diagnosis of left toxic adenoma, now status post RAI treatment in 11/2015.  Subsequent TFTs were normal in 03/2016 and also at our last visit in 07/2016.  Afterwards TSH started to increase. - She continues on the blockers she still has tachycardia so we'll not stop. However, these were started before, for high blood pressure. - No further investigation is needed for her thyroid nodule, since this was "hot' and therefore very low risk for cancer - We discussed that usually the nodules decrease in size after RAI treatment - No neck compression symptoms  2.  Post ablative hypothyroidism - New diagnosis - latest thyroid labs reviewed with pt >> TSH started to increase after her RAI treatment and is now above the normal range.  Free T4 and free T3 are normal. - She is not on levothyroxine. - We discussed about subclinical hypothyroidism in general and also about indications for treatment:  Desire for pregnancy  TSH higher than 10  TSH lower than 10 with signs or symptoms consistent with hypothyroidism She would like to start levothyroxine now.  We will start at 50 mcg daily. - we discussed about taking the thyroid hormone every day, with water, >30 minutes before breakfast, separated by >4 hours from acid reflux medications, calcium, iron, multivitamins. Pt. is taking it correctly. - will check thyroid tests today: TSH and fT4 1.5 months after she starts treatment - I will see her back in 6 months.  3. DM2 -Recent diagnosis -Reviewed together her HbA1c levels: Last significantly decreased after she started metformin.  She is tolerating this well. -She is on Cymbalta, Neurontin, and alpha lipoic acid for neuropathy -We will refill her  metformin for now  Orders Placed This Encounter  Procedures  . TSH  . T4, free   Cristina Gherghe, MD PhD [facility]   

## 2017-11-06 NOTE — Patient Instructions (Signed)
Please start Levothyroxine 50 mcg daily.  Take the thyroid hormone every day, with water, at least 30 minutes before breakfast, separated by at least 4 hours from: - acid reflux medications - calcium - iron - multivitamins  Please come back in 6 weeks for labs.  Please come back for a follow-up appointment in 6 months.

## 2017-11-11 ENCOUNTER — Encounter: Payer: Self-pay | Admitting: Internal Medicine

## 2017-12-22 ENCOUNTER — Other Ambulatory Visit (INDEPENDENT_AMBULATORY_CARE_PROVIDER_SITE_OTHER): Payer: Managed Care, Other (non HMO)

## 2017-12-22 DIAGNOSIS — E890 Postprocedural hypothyroidism: Secondary | ICD-10-CM

## 2017-12-22 LAB — T4, FREE: Free T4: 0.84 ng/dL (ref 0.60–1.60)

## 2017-12-22 LAB — TSH: TSH: 3.35 u[IU]/mL (ref 0.35–4.50)

## 2018-01-12 ENCOUNTER — Other Ambulatory Visit: Payer: Self-pay | Admitting: Internal Medicine

## 2018-01-12 DIAGNOSIS — E119 Type 2 diabetes mellitus without complications: Secondary | ICD-10-CM

## 2018-01-22 ENCOUNTER — Other Ambulatory Visit: Payer: Self-pay | Admitting: Neurology

## 2018-02-25 ENCOUNTER — Other Ambulatory Visit: Payer: Self-pay | Admitting: Neurology

## 2018-03-07 ENCOUNTER — Other Ambulatory Visit: Payer: Self-pay | Admitting: Internal Medicine

## 2018-03-07 DIAGNOSIS — E119 Type 2 diabetes mellitus without complications: Secondary | ICD-10-CM

## 2018-04-17 ENCOUNTER — Other Ambulatory Visit: Payer: Self-pay | Admitting: Internal Medicine

## 2018-04-17 DIAGNOSIS — E119 Type 2 diabetes mellitus without complications: Secondary | ICD-10-CM

## 2018-05-08 ENCOUNTER — Other Ambulatory Visit: Payer: Self-pay | Admitting: Internal Medicine

## 2018-05-10 ENCOUNTER — Other Ambulatory Visit: Payer: Self-pay

## 2018-05-10 ENCOUNTER — Encounter: Payer: Self-pay | Admitting: Internal Medicine

## 2018-05-10 ENCOUNTER — Ambulatory Visit: Payer: Managed Care, Other (non HMO) | Admitting: Internal Medicine

## 2018-05-10 VITALS — BP 130/80 | HR 86 | Ht 69.0 in | Wt 374.0 lb

## 2018-05-10 DIAGNOSIS — E119 Type 2 diabetes mellitus without complications: Secondary | ICD-10-CM | POA: Diagnosis not present

## 2018-05-10 DIAGNOSIS — E890 Postprocedural hypothyroidism: Secondary | ICD-10-CM | POA: Diagnosis not present

## 2018-05-10 DIAGNOSIS — E0510 Thyrotoxicosis with toxic single thyroid nodule without thyrotoxic crisis or storm: Secondary | ICD-10-CM | POA: Diagnosis not present

## 2018-05-10 LAB — POCT GLYCOSYLATED HEMOGLOBIN (HGB A1C): HEMOGLOBIN A1C: 6.6 % — AB (ref 4.0–5.6)

## 2018-05-10 MED ORDER — GLUCOSE BLOOD VI STRP: 1.0000 | ORAL_STRIP | Freq: Every day | 3 refills | Status: DC

## 2018-05-10 MED ORDER — METFORMIN HCL 500 MG PO TABS: ORAL_TABLET | ORAL | 3 refills | Status: DC

## 2018-05-10 MED ORDER — ACCU-CHEK SOFTCLIX LANCETS MISC: 1.0000 | Freq: Every day | 3 refills | Status: DC

## 2018-05-10 NOTE — Progress Notes (Addendum)
Patient ID: Katie Harris, female   DOB: 12-Jun-1975, 43 y.o.   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 Last visit 6 months ago.  She gained 16 pounds since last visit.  She was previously working with Dr. Leafy Ro in the weight loss clinic.  She stopped going to see her last summer and then she started to gain weight (a total of 25 pounds since then).  She previously lost 30 pounds with Dr. Leafy Ro.  She would like to return to see her.  Reviewed and addended history: Pt was diagnosed with thyrotoxicosis in 11/2014. At that time, she was taking several thyrotoxic supplements and also took Biotin.  Before our first visit, she was on biotin, B6 vitamin, Boswellia, but was previously on Hawthorne, [employer] - stopped 3-4 weeks prior to the appt. I advised her to stay off all the supplements and hold the Biotin few days before next TFTs.  TSI antibodies were not elevated.   TFTs continued to fluctuate but she continued to have suppressed TSH and elevated free T3.  Thyroid Uptake and scan which she had on 10/25/2015: Elevated 24 hour radio iodine uptake of 42%.  Large hot nodule within LEFT thyroid lobe with suppression of uptake within the RIGHT lobe consistent with a toxic adenoma.   11/09/2015: RAI treatment  She developed post ablative hypothyroidism afterwards.  She is on levothyroxine 50 mcg daily: - in am - fasting - at least 30 min from b'fast - no Ca, Fe, MVI, PPIs - not on Biotin  Reviewed patient's TFTs: Lab Results  Component Value Date   TSH 3.35 12/22/2017   TSH 6.830 (H) 09/08/2017   TSH 4.780 (H) 04/23/2017   TSH 4.260 01/06/2017   TSH 2.78 07/29/2016   TSH 2.37 03/31/2016   TSH 0.11 (L) 12/21/2015   TSH 0.03 (L) 10/15/2015   TSH 0.18 (L) 08/31/2015   TSH 0.05 (L) 07/19/2015   FREET4 0.84 12/22/2017   FREET4 0.90 09/08/2017    FREET4 0.95 04/23/2017   FREET4 0.72 07/29/2016   FREET4 0.55 (L) 03/31/2016   FREET4 0.89 12/21/2015   FREET4 1.60 10/15/2015   FREET4 1.32 08/31/2015   FREET4 1.52 07/19/2015   T3FREE 3.3 07/29/2016   T3FREE 2.7 03/31/2016   T3FREE 3.8 12/21/2015   T3FREE 4.5 (H) 10/15/2015   T3FREE 5.0 (H) 08/31/2015   T3FREE 5.2 (H) 07/19/2015  11/07/2014: TSH <0.01, fT4 1.54 (0.61-1.18)  Her TSI antibodies were not elevated: Lab Results  Component Value Date   TSI <89 07/19/2015   She continues Toprol-XL 50 mg twice a day.  Pt denies: - feeling nodules in neck - hoarseness - dysphagia - choking - SOB with lying down  Pt does have a FH of thyroid ds: hypothyroidism in mother, possibly MGM. No FH of thyroid cancer. No h/o radiation tx to head or neck except for RAI treatment.  No herbal supplements. No Biotin use. No recent steroids use.   She also has a history of PCOS and pseudotumor cerebri.  She also has DM2:  She is on Metformin 500 mg 2x a day >> off for 2 weeks (ran out).  She was checking sugars before, now stopped.  Latest HbA1c: Lab Results  Component Value Date   HGBA1C 5.9 (H) 09/08/2017   HGBA1C 7.4 (H) 04/23/2017   ROS: Constitutional: + Weight gain/no weight loss, +  fatigue, no subjective hyperthermia, no subjective hypothermia Eyes: no blurry vision, no xerophthalmia ENT: no sore throat, + see HPI Cardiovascular: no CP/no SOB/no palpitations/no leg swelling Respiratory: no cough/no SOB/no wheezing Gastrointestinal: no N/no V/no D/no C/no acid reflux Musculoskeletal: no muscle aches/no joint aches Skin: + Heat rash on face, chest, upper arms, no hair loss Neurological: no tremors/no numbness/no tingling/no dizziness  I reviewed pt's medications, allergies, PMH, social hx, family hx, and changes were documented in the history of present illness. Otherwise, unchanged from my initial visit note.  Past Medical History:  Diagnosis Date  . Anxiety   .  Arthritis    lower back  . Back pain   . 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 Cancer ([place])    basal cell removed from left hairline area  . Depression   . Diabetes mellitus without complication ([place])    recent A1C 6.5 no meds presently  . Factor 5 Leiden mutation, heterozygous ([place]) 04/21/2012   Lab 04/16/12 in view of positive Leiden in her father; no personal hx of thrombosis despite prior pregnancy & C-section 2007  . 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 HTN (hypertension), benign 04/21/2012  . Hypertension   . Hyperthyroidism    levels were abnormal in Dec- no treatment currently  . Hyperthyroidism 04/21/2012  . Infertility, female   . Leg edema   . PCOS (polycystic ovarian syndrome)   . Peripheral vascular disease (HCC)    neuropathy bilateral feet  . Prediabetes   . Sleep apnea    Past Surgical History:  Procedure Laterality Date  . CESAREAN SECTION    . DILATATION & CURETTAGE/HYSTEROSCOPY WITH MYOSURE N/A 10/17/2016   Procedure: DILATATION & CURETTAGE/HYSTEROSCOPY WITH MYOSURE POLYPECTOMY;  Surgeon: Paula Compton, MD;  Location: [facility];  Service: Gynecology;  Laterality: N/A;  myosure rep will be here confirmed on 10/06/16  . DILATION AND CURETTAGE OF UTERUS     polpectomy  . INTRAUTERINE DEVICE (IUD) INSERTION N/A 10/17/2016   Procedure: INTRAUTERINE DEVICE (IUD) INSERTION MIRENA;  Surgeon: Paula Compton, MD;  Location: [facility];  Service: Gynecology;  Laterality: N/A;  . TONSILLECTOMY     Social History   Social History  . Marital Status: Married    Spouse Name: N/A  . Number of Children: 1   Occupational History  . Project coordinator   Social History Main Topics  . Smoking status: Never Smoker   . Smokeless tobacco: Not on file  . Alcohol Use: No  . Drug Use: No   Current Outpatient Medications on File Prior to Visit  Medication Sig Dispense Refill  . ACCU-CHEK SOFTCLIX LANCETS lancets 1 each by Other route 2 (two) times  daily. Use as instructed 100 each 0  . Blood Glucose Monitoring Suppl (ACCU-CHEK GUIDE) w/Device KIT 1 kit by Does not apply route 2 (two) times daily. 1 kit 0  . cyclobenzaprine (FLEXERIL) 10 MG tablet TK 1 T PO HS PRN  3  . DULoxetine (CYMBALTA) 60 MG capsule Take 60 mg by mouth daily.   6  . gabapentin (NEURONTIN) 300 MG capsule TAKE 2 CAPSULES(600 MG) BY MOUTH THREE TIMES DAILY 270 capsule 1  . glucose blood (ACCU-CHEK GUIDE) test strip 1 each by Other route 2 (two) times daily. Use as instructed 100 each 0  . levothyroxine (SYNTHROID, LEVOTHROID) 50 MCG tablet TAKE 1 TABLET(50 MCG) BY MOUTH DAILY 45 tablet 3  . metFORMIN (GLUCOPHAGE) 500 MG tablet  TAKE 1 TABLET(500 MG) BY MOUTH TWICE DAILY WITH A MEAL 90 tablet 0  . metoprolol (LOPRESSOR) 50 MG tablet Take 50 mg by mouth 2 (two) times daily.     . Vitamin D, Ergocalciferol, (DRISDOL) 50000 units CAPS capsule Take 1 capsule (50,000 Units total) by mouth every 7 (seven) days. 4 capsule 0   No current facility-administered medications on file prior to visit.    No Known Allergies Family History  Problem Relation Age of Onset  . Hypertension Mother   . Hyperlipidemia Mother   . Thyroid disease Mother   . Depression Mother   . Anxiety disorder Mother   . Sleep apnea Mother   . Obesity Mother   . Heart disease Father   . Diabetes Father   . Hypertension Father   . Hyperlipidemia Father   . Obesity Father   . Diabetes Maternal Grandmother   . Hyperlipidemia Maternal Grandmother   . Hyperlipidemia Maternal Grandfather   . Hyperlipidemia Paternal Grandmother   . Diabetes Paternal Grandmother   . Cancer Paternal Grandmother   . Heart disease Paternal Grandfather     PE: BP 130/80   Pulse 86   Ht '5\' 9"'$  (1.753 m)   Wt (!) 374 lb (169.6 kg)   SpO2 99%   BMI 55.23 kg/m  Wt Readings from Last 3 Encounters:  05/10/18 (!) 374 lb (169.6 kg)  11/06/17 (!) 359 lb 3.2 oz (162.9 kg)  09/29/17 (!) 349 lb (158.3 kg)   Constitutional:  overweight, in NAD Eyes: PERRLA, EOMI, no exophthalmos ENT: moist mucous membranes, no thyromegaly, no cervical lymphadenopathy Cardiovascular: RRR, No MRG Respiratory: CTA B Gastrointestinal: abdomen soft, NT, ND, BS+ Musculoskeletal: no deformities, strength intact in all 4 Skin: moist, warm, no rashes Neurological: no tremor with outstretched hands, DTR normal in all 4  ASSESSMENT: 1. Toxic thyroid nodule - s/p ablation  2.  Post ablative hypothyroidism  3. DM2  PLAN:  1. Patient with history of thyrotoxicosis, with a diagnosis of left toxic adenoma, now status post RAI treatment in 11/2015.  Subsequent TFTs were normal in 03/2016 and also in 07/2016.  Afterwards, PSA started to increase. -She has tachycardia even on beta-blockers, and these were started before her thyroid disease, for blood pressure control.  We will continue these. -No further investigation is needed for her thyroid nodule, since this was hot and therefore very low risk for cancer.. -She denies neck compression symptoms  2.  Post ablative hypothyroidism -Started levothyroxine at last visit - latest thyroid labs reviewed with pt >> normal 12/2017 - she continues on LT4 50 mcg daily - pt feels good on this dose. - we discussed about taking the thyroid hormone every day, with water, >30 minutes before breakfast, separated by >4 hours from acid reflux medications, calcium, iron, multivitamins. Pt. is taking it correctly. - will check thyroid tests today: TSH and fT4  3. DM2 -Diagnosed before last visit -HbA1c decreased significantly after starting metformin.  She is tolerating this well -She is on Cymbalta, Neurontin, and alpha lipoic acid for neuropathy - today, HbA1c is 6.6% (higher) - Most likely exacerbated by significant weight gain of 16 pounds since last visit, and 25 pounds since 09/2017.  Encouraged her to return to see Dr. Leafy Ro, which she would like to do. - continue checking sugars at different  times of the day - check 1x a day or at least every other day, rotating checks - advised for yearly eye exams (last 10/2017) >> she is  UTD - Return to clinic in 4 mo with sugar log   Component     Latest Ref Rng & Units 05/10/2018  T4,Free(Direct)     0.60 - 1.60 ng/dL 0.95  TSH     0.35 - 4.50 uIU/mL 3.71  Hemoglobin A1C     4.0 - 5.6 % 6.6 (A)   TFTs are normal but TSH is slightly high in the normal range.  I will advised her to increase levothyroxine to 75 mcg daily.  We will repeat her labs in 1.5 months.  Philemon Kingdom, MD PhD [facility]

## 2018-05-10 NOTE — Patient Instructions (Signed)
Please start Levothyroxine 50 mcg daily.  Take the thyroid hormone every day, with water, at least 30 minutes before breakfast, separated by at least 4 hours from: - acid reflux medications - calcium - iron - multivitamins  Please come back for a follow-up appointment in 4 months.

## 2018-05-11 LAB — TSH: TSH: 3.71 u[IU]/mL (ref 0.35–4.50)

## 2018-05-11 LAB — T4, FREE: Free T4: 0.95 ng/dL (ref 0.60–1.60)

## 2018-05-12 MED ORDER — LEVOTHYROXINE SODIUM 75 MCG PO TABS: 75.0000 ug | ORAL_TABLET | Freq: Every day | ORAL | 3 refills | Status: DC

## 2018-05-12 NOTE — Addendum Note (Signed)
Addended by: Philemon Kingdom on: 05/12/2018 01:44 PM   Modules accepted: Orders

## 2018-05-19 ENCOUNTER — Encounter (INDEPENDENT_AMBULATORY_CARE_PROVIDER_SITE_OTHER): Payer: Managed Care, Other (non HMO)

## 2018-05-25 ENCOUNTER — Ambulatory Visit (INDEPENDENT_AMBULATORY_CARE_PROVIDER_SITE_OTHER): Payer: Managed Care, Other (non HMO) | Admitting: Family Medicine

## 2018-06-08 ENCOUNTER — Ambulatory Visit (INDEPENDENT_AMBULATORY_CARE_PROVIDER_SITE_OTHER): Payer: Managed Care, Other (non HMO) | Admitting: Family Medicine

## 2018-06-16 ENCOUNTER — Other Ambulatory Visit: Payer: Self-pay | Admitting: Neurology

## 2018-07-03 ENCOUNTER — Telehealth: Payer: Self-pay | Admitting: Neurology

## 2018-07-03 ENCOUNTER — Other Ambulatory Visit: Payer: Self-pay | Admitting: Neurology

## 2018-07-03 NOTE — Telephone Encounter (Signed)
Patient needs appointment in neurology for further refills.

## 2018-07-03 NOTE — Telephone Encounter (Signed)
Patient needs appointment in neurology for further refills. Please let her know, thanks

## 2018-07-03 NOTE — Telephone Encounter (Signed)
Patient needs follow up before anymore refills, or she can ask pcp to refill. Let her know thanks.

## 2018-07-05 ENCOUNTER — Encounter: Payer: Self-pay | Admitting: *Deleted

## 2018-07-05 NOTE — Telephone Encounter (Signed)
Mychart message sent to patient on her active mychart account.

## 2018-07-05 NOTE — Telephone Encounter (Signed)
LMVM for pt to return call to update chart for upcoming appt tomorrow.

## 2018-07-06 ENCOUNTER — Ambulatory Visit (INDEPENDENT_AMBULATORY_CARE_PROVIDER_SITE_OTHER): Payer: Managed Care, Other (non HMO) | Admitting: Family Medicine

## 2018-07-06 ENCOUNTER — Encounter: Payer: Self-pay | Admitting: Family Medicine

## 2018-07-06 ENCOUNTER — Other Ambulatory Visit: Payer: Self-pay

## 2018-07-06 DIAGNOSIS — IMO0002 Reserved for concepts with insufficient information to code with codable children: Secondary | ICD-10-CM | POA: Insufficient documentation

## 2018-07-06 DIAGNOSIS — G43709 Chronic migraine without aura, not intractable, without status migrainosus: Secondary | ICD-10-CM | POA: Diagnosis not present

## 2018-07-06 DIAGNOSIS — R519 Headache, unspecified: Secondary | ICD-10-CM | POA: Insufficient documentation

## 2018-07-06 DIAGNOSIS — G4733 Obstructive sleep apnea (adult) (pediatric): Secondary | ICD-10-CM

## 2018-07-06 DIAGNOSIS — R51 Headache: Secondary | ICD-10-CM

## 2018-07-06 DIAGNOSIS — Z9989 Dependence on other enabling machines and devices: Secondary | ICD-10-CM

## 2018-07-06 MED ORDER — TOPIRAMATE 25 MG PO TABS: 25.0000 mg | ORAL_TABLET | Freq: Every day | ORAL | 5 refills | Status: DC

## 2018-07-06 NOTE — Progress Notes (Signed)
Made any corrections needed, and agree with history, physical, neuro exam,assessment and plan as stated.     Antonia Ahern, MD [facility]  

## 2018-07-06 NOTE — Progress Notes (Addendum)
PATIENT: Katie Harris DOB: 1976/02/26  REASON FOR VISIT: follow up HISTORY FROM: patient  Virtual Visit via Telephone Note  I connected with Katie Harris on 07/06/18 at 11:30 AM EDT by telephone and verified that I am speaking with the correct person using two identifiers.   I discussed the limitations, risks, security and privacy concerns of performing an evaluation and management service by telephone and the availability of in person appointments. I also discussed with the patient that there may be a patient responsible charge related to this service. The patient expressed understanding and agreed to proceed.   History of Present Illness:   07/06/18 Katie Harris is a 43 y.o. female for follow up of migraines and OSA on CPAP.  She reports that she is doing very well with CPAP therapy.  She is compliant with daily use.  She did note some benefit of reduction in her headache frequency in the beginning, however, her headaches have returned.  She reports over the past year or so that she has had nearly daily headaches.  Headaches are typically bifrontal.  They are not associated with vision changes.  Pain is usually a pressure and sometimes pounding.  She has not noted any significant light or sound sensitivity.  She reports that her home is usually very dark due to family preference.  She is hard of hearing so she does not feel that sounds bother her.  She reports that she is having to take Tylenol at least once daily.  She does have a history of at age.  MRI/MRA and MRI of orbits in 2018 were normal.  She reports her last eye exam was the end of 2019 and this was normal.  She is not interested in a formal work-up to evaluate for IH at this time.  She does admit to weight gain over the past year.  She feels that this has significantly contributed to the increase in her headaches.  She has tried multiple preventive medications in the past.  She has seen multiple neurologists in the past.  She  is unable to give me names of medications with the exception of Imitrex which she did not tolerate.  She feels that she has tried amitriptyline and topiramate in the past but cannot remember why these were stopped.  She does not remember any significant adverse reaction.  She does also suffer with neuropathy.  She was diagnosed with diabetes last year.  She is taking gabapentin 300 mg 3 times a day and this significantly helps with neuropathy of bilateral lower extremities.  She occasionally has some increased sensitivity of her fingertips.   06/06/2018 - 07/05/2018 Usage 06/06/2018 - 07/05/2018 Usage days 28/30 days (93%) >= 4 hours 26 days (87%) < 4 hours 2 days (7%) Usage hours 200 hours 32 minutes Average usage (total days) 6 hours 41 minutes Average usage (days used) 7 hours 10 minutes Median usage (days used) 7 hours 27 minutes Total used hours (value since last reset - 07/05/2018) 3,293 hours AirSense 10 AutoSet For Her Serial number [id number] Mode AutoSet for Her Min Pressure 5 cmH2O Max Pressure 13 cmH2O EPR Fulltime EPR level 3 Therapy Pressure - cmH2O Median: 9.5 95th percentile: 11.9 Maximum: 12.5 Leaks - L/min Median: 0.2 95th percentile: 10.2 Maximum: 25.8 Events per hour AI: 0.8 HI: 0.7 AHI: 1.5 Apnea Index Central: 0.0 Obstructive: 0.7 Unknown: 0.0 RERA Index 0.5 Cheyne-Stokes respiration (average duration per night) 0 minutes (0%)   History (copied from Dr Guadelupe Sabin  note on 07/09/2017)  Ms. Freshour is a 43 year old right-handed woman with an underlying medical history of idiopathic intracranial hypertension, anxiety, arthritis, factor V Leiden, recurrent headaches, hypertension, hyperthyroidism, history of neuropathy, and morbid obesity with a BMI of over 81, who presents for follow-up consultation of her obstructive sleep apnea, after recent sleep study testing and starting AutoPap therapy. Patient is unaccompanied today. I first met her on 01/06/2017 at the request of  Dr. Jaynee Eagles, at which time the patient reported snoring and morning headaches. I suggested we proceed with a sleep study. She had a baseline sleep study on 04/17/2017. I went over her test results with her in detail today. Her sleep latency was delayed at 48 minutes, sleep efficiency reduced at 69.1%, REM latency was delayed at 325.5 minutes. She had an increased percentage of stage I sleep, an increased percentage of slow-wave sleep and a reduced percentage of REM sleep. Total AHI was 8.4 per hour, REM AHI was 11.8 per hour, supine sleep was not achieved. Average oxygen saturation was 96%, nadir was 83%. Time below 89% saturation was 16 minutes. She had no significant PLMS. I suggested treatment of her mild OSA with AutoPap therapy at home.  Today, 07/09/2017: I reviewed her AutoPap compliance data from 06/08/2017 through 07/07/2017 which is a total of 30 days, during which time she used her AutoPap every night with percent used days greater than 4 hours at 100%, indicating superb compliance with an average usage of 8 hours and 45 minutes, residual AHI 2.3 per hour, 95th percentile pressure at 12.3 cm, leak acceptable with the 95th percentile of 12.4 L/m on a pressure range of 5-13 cm with EPR. She reports feeling about the same, she is able to tolerate AutoPap. She uses a fullface mask. She had a recent increase in her gabapentin to 600 mg 3 times a day but could not tolerate the daytime dose that high. She reports some improvement in her headache.  Previously:  01/06/2017: (She) reports snoring and morning headaches. She had an eye exam recently no papilledema. Tonsillectomy as a child. She is s/p RAI last year in September. Her father has OSA. Her husband has noted leg movements and twitching, while she is asleep.  I reviewed your office note from 12/04/2016. She had blood work today and felt a little lightheaded, improved after a little snack. Epworth sleepiness score is 5 out of 24, fatigue score is  38 out of 63. She is a nonsmoker and does not utilize alcohol, drinks caffeine about 2-3 servings per day. She lives at home with her husband and her 44 year old daughter. She works for a [employer], [employer]. She feels tired during the day and not fully rested when she first wakes up. She tries to be embedded before 11. Wakeup time is around 7. She denies telltale symptoms of restless leg syndrome. She had a tonsillectomy as a child. She has nocturia about once per average night. She has had morning headaches. Her father has sleep apnea. Her snoring can be loud and disruptive to her husband'ssleep.   Observations/Objective:  Generalized: Well developed, in no acute distress  Mentation: Alert oriented to time, place, history taking. Follows all commands speech and language fluent   Assessment and Plan:  43 y.o. year old female  has a past medical history of Anxiety, Arthritis, Back pain, Blood dyscrasia, Cancer ([place]), Depression, Diabetes mellitus without complication ([place]), Factor 5 Leiden mutation, heterozygous ([place]) (04/21/2012), Headache(784.0), HTN (hypertension), benign (04/21/2012), Hypertension, Hyperthyroidism, Hyperthyroidism (04/21/2012), Infertility, female, Leg  edema, PCOS (polycystic ovarian syndrome), Peripheral vascular disease ([place]), Prediabetes, and Sleep apnea. with    ICD-10-CM   1. OSA on CPAP G47.33    Z99.89   2. Chronic migraine G43.709    We have discussed multiple contributing factors to persistent daily headaches and neuropathy.  She will continue gabapentin 300 mg 3 times a day as prescribed.  We have discussed preventative medications for treatment of daily headaches including topiramate and amitriptyline.  Although she does suffer from neuropathy, she is most comfortable with starting topiramate.  She is hopeful that this medication may decrease the frequency of her headaches as well as offer weight loss benefits.  We have discussed in detail the chance of worsening  neuropathy with topiramate.  We will start at 25 mg daily and increase slowly as tolerated.  She is aware we may need to adjust therapy if neuropathy worsens.  She will continue CPAP therapy.  She was encouraged nightly use and for greater than 4 hours each night.  We have also discussed potential need to repeat MRI and eye exam should headaches persist.  We have discussed in detail healthy, low inflammatory diet.  She was advised to research anti-inflammatory diet.  Regular exercise will also help.  I would like to follow-up with her in 3 months.  She verbalizes understanding and agreement with this plan.   No orders of the defined types were placed in this encounter.   No orders of the defined types were placed in this encounter.    Follow Up Instructions:  I discussed the assessment and treatment plan with the patient. The patient was provided an opportunity to ask questions and all were answered. The patient agreed with the plan and demonstrated an understanding of the instructions.   The patient was advised to call back or seek an in-person evaluation if the symptoms worsen or if the condition fails to improve as anticipated.  I provided 25 minutes of non-face-to-face time during this encounter.  Patient is located at her place of employment during video conference.  Provider is located at her place of residence.  Liane Comber, RN helped to facilitate visit.   Debbora Presto, NP

## 2018-07-28 ENCOUNTER — Telehealth: Payer: Self-pay

## 2018-07-28 NOTE — Telephone Encounter (Signed)
Patient's VM is full. I will attempt to call back later.   If patient calls back please schedule a 3 mon follow up with Amy around July 28

## 2018-08-22 ENCOUNTER — Other Ambulatory Visit: Payer: Self-pay | Admitting: Neurology

## 2018-08-23 ENCOUNTER — Other Ambulatory Visit: Payer: Self-pay | Admitting: Family Medicine

## 2018-08-23 ENCOUNTER — Encounter: Payer: Self-pay | Admitting: Family Medicine

## 2018-08-23 MED ORDER — TOPIRAMATE 25 MG PO TABS: 50.0000 mg | ORAL_TABLET | Freq: Every day | ORAL | 3 refills | Status: DC

## 2018-08-26 ENCOUNTER — Encounter: Payer: Self-pay | Admitting: Internal Medicine

## 2018-09-07 ENCOUNTER — Encounter: Payer: Self-pay | Admitting: Family Medicine

## 2018-09-14 ENCOUNTER — Ambulatory Visit: Payer: Managed Care, Other (non HMO) | Admitting: Internal Medicine

## 2018-09-16 ENCOUNTER — Other Ambulatory Visit: Payer: Self-pay | Admitting: Neurology

## 2018-10-14 ENCOUNTER — Other Ambulatory Visit: Payer: Self-pay | Admitting: Neurology

## 2018-11-04 ENCOUNTER — Other Ambulatory Visit: Payer: Self-pay | Admitting: Neurology

## 2018-12-02 ENCOUNTER — Other Ambulatory Visit: Payer: Self-pay | Admitting: Neurology

## 2018-12-02 ENCOUNTER — Other Ambulatory Visit: Payer: Self-pay | Admitting: Internal Medicine

## 2019-01-06 ENCOUNTER — Ambulatory Visit: Payer: Managed Care, Other (non HMO) | Admitting: Family Medicine

## 2019-01-06 ENCOUNTER — Other Ambulatory Visit: Payer: Self-pay

## 2019-01-06 ENCOUNTER — Encounter: Payer: Self-pay | Admitting: Family Medicine

## 2019-01-06 VITALS — BP 144/92 | HR 72 | Temp 97.9°F | Ht 68.5 in | Wt 369.0 lb

## 2019-01-06 DIAGNOSIS — R519 Headache, unspecified: Secondary | ICD-10-CM | POA: Diagnosis not present

## 2019-01-06 DIAGNOSIS — Z9989 Dependence on other enabling machines and devices: Secondary | ICD-10-CM

## 2019-01-06 DIAGNOSIS — G4733 Obstructive sleep apnea (adult) (pediatric): Secondary | ICD-10-CM

## 2019-01-06 DIAGNOSIS — E0842 Diabetes mellitus due to underlying condition with diabetic polyneuropathy: Secondary | ICD-10-CM

## 2019-01-06 MED ORDER — GABAPENTIN 300 MG PO CAPS: 600.0000 mg | ORAL_CAPSULE | Freq: Three times a day (TID) | ORAL | 11 refills | Status: DC

## 2019-01-06 MED ORDER — TOPIRAMATE 25 MG PO TABS: 50.0000 mg | ORAL_TABLET | Freq: Every day | ORAL | 3 refills | Status: DC

## 2019-01-06 NOTE — Progress Notes (Addendum)
PATIENT: Katie Harris DOB: [birth year]  REASON FOR VISIT: follow up HISTORY FROM: patient  Chief Complaint  Patient presents with   Follow-up    Room 1, alone. Headaches are better. Neuropathy-shouting paints left top of foot.     HISTORY OF PRESENT ILLNESS: Today 01/06/19 Katie Harris is a 43 y.o. female here today for follow up of headaches and neuropathy pain. We started her back on topiramate in April. She has taken '50mg'$  at bedtime and feels that headaches have improved. She continues to have mild daily headaches. She reports bifrontal retro orbital headaches. Feels pressure behind her eyes. She has not had any migraines/migraious symptoms over the past 4-5 months. She reports neuropathy has been stable. She has had more pain today than normal. She has had some tingling of right ring finger. This is not limiting her activity. She reports taking gabapentin '300mg'$  in am, '600mg'$  at 3p and '900mg'$  at bedtime. '600mg'$  TID ordered with Dr Jaynee Eagles in 06/2017. She is working closely with her endocrinologist for elevated CBG. Readings are consistently around 200. She was diagnosed with IBS recently. Blood pressures are usually 130's/90's. Today's reading was 156/100 and 144/92 on recheck.  She is doing very well with CPAP therapy at home.  She reports nightly compliance.  Compliance report reviewed from 11/05/2018 through 01/04/2019.  She is using CPAP therapy nightly and for greater than 4 hours each night.  Average usage is 8 hours and 48 minutes.  AHI is 1.9 on 5 to 13 cm of water and an EPR of 3.  There is no significant leak.   HISTORY: (copied from my note on 07/06/2018)  Katie Harris is a 43 y.o. female for follow up of migraines and OSA on CPAP.  She reports that she is doing very well with CPAP therapy.  She is compliant with daily use.  She did note some benefit of reduction in her headache frequency in the beginning, however, her headaches have returned.  She reports over the past year or so  that she has had nearly daily headaches.  Headaches are typically bifrontal.  They are not associated with vision changes.  Pain is usually a pressure and sometimes pounding.  She has not noted any significant light or sound sensitivity.  She reports that her home is usually very dark due to family preference.  She is hard of hearing so she does not feel that sounds bother her.  She reports that she is having to take Tylenol at least once daily.  She does have a history of at age.  MRI/MRA and MRI of orbits in 2018 were normal.  She reports her last eye exam was the end of 2019 and this was normal.  She is not interested in a formal work-up to evaluate for IH at this time.  She does admit to weight gain over the past year.  She feels that this has significantly contributed to the increase in her headaches.  She has tried multiple preventive medications in the past.  She has seen multiple neurologists in the past.  She is unable to give me names of medications with the exception of Imitrex which she did not tolerate.  She feels that she has tried amitriptyline and topiramate in the past but cannot remember why these were stopped.  She does not remember any significant adverse reaction.  She does also suffer with neuropathy.  She was diagnosed with diabetes last year.  She is taking gabapentin 300 mg  3 times a day and this significantly helps with neuropathy of bilateral lower extremities.  She occasionally has some increased sensitivity of her fingertips.   06/06/2018 - 07/05/2018 Usage 06/06/2018 - 07/05/2018 Usage days 28/30 days (93%) >= 4 hours 26 days (87%) < 4 hours 2 days (7%) Usage hours 200 hours 32 minutes Average usage (total days) 6 hours 41 minutes Average usage (days used) 7 hours 10 minutes Median usage (days used) 7 hours 27 minutes Total used hours (value since last reset - 07/05/2018) 3,293 hours AirSense 10 AutoSet For Her Serial number [id number] Mode AutoSet for Her Min  Pressure 5 cmH2O Max Pressure 13 cmH2O EPR Fulltime EPR level 3 Therapy Pressure - cmH2O Median: 9.5 95th percentile: 11.9 Maximum: 12.5 Leaks - L/min Median: 0.2 95th percentile: 10.2 Maximum: 25.8 Events per hour AI: 0.8 HI: 0.7 AHI: 1.5 Apnea Index Central: 0.0 Obstructive: 0.7 Unknown: 0.0 RERA Index 0.5 Cheyne-Stokes respiration (average duration per night) 0 minutes (0%)   History (copied from Dr Guadelupe Sabin note on 07/09/2017)  Katie Harris is a 43 year old right-handed woman with an underlying medical history of idiopathic intracranial hypertension, anxiety, arthritis, factor V Leiden, recurrent headaches, hypertension, hyperthyroidism, history of neuropathy, and morbid obesity with a BMI of over 40, who presents for follow-up consultation of her obstructive sleep apnea, after recent sleep study testing and starting AutoPap therapy. Patient is unaccompanied today. I first met her on 01/06/2017 at the request of Dr. Jaynee Eagles, at which time the patient reported snoring and morning headaches. I suggested we proceed with a sleep study. She had a baseline sleep study on 04/17/2017. I went over her test results with her in detail today. Her sleep latency was delayed at 48 minutes, sleep efficiency reduced at 69.1%, REM latency was delayed at 325.5 minutes. She had an increased percentage of stage I sleep, an increased percentage of slow-wave sleep and a reduced percentage of REM sleep. Total AHI was 8.4 per hour, REM AHI was 11.8 per hour, supine sleep was not achieved. Average oxygen saturation was 96%, nadir was 83%. Time below 89% saturation was 16 minutes. She had no significant PLMS. I suggested treatment of her mild OSA with AutoPap therapy at home.  Today, 07/09/2017: I reviewed her AutoPap compliance data from 06/08/2017 through 07/07/2017 which is a total of 30 days, during which time she used her AutoPap every night with percent used days greater than 4 hours at 100%, indicating superb  compliance with an average usage of 8 hours and 45 minutes, residual AHI 2.3 per hour, 95thpercentile pressure at 12.3 cm, leak acceptable with the 95thpercentile of 12.4 L/m on a pressure range of 5-13 cm with EPR. She reportsfeeling about the same, she is able to tolerate AutoPap. She uses a fullface mask. She had a recent increase in her gabapentin to 600 mg 3 times a day but could not tolerate the daytime dose that high. She reports some improvement in her headache.  Previously:  01/06/2017: (She) reports snoring and morning headaches. She had an eye exam recently no papilledema. Tonsillectomy as a child. She is s/p RAI last year in September. Her father has OSA. Her husband has noted leg movements and twitching, while she is asleep.  I reviewed your office note from 12/04/2016. She had blood work today and felt a little lightheaded, improved after a little snack. Epworth sleepiness score is 5 out of 24, fatigue score is 38 out of 63. She is a nonsmoker and does not utilize alcohol, drinks caffeine  about 2-3 servings per day. She lives at home with her husband and her 37 year old daughter. She works for a [employer], [employer]. She feels tired during the day and not fully rested when she first wakes up. She tries to be embedded before 11. Wakeup time is around 7. She denies telltale symptoms of restless leg syndrome. She had a tonsillectomy as a child. She has nocturia about once per average night. She has had morning headaches. Her father has sleep apnea. Hersnoring can be loud and disruptive to her husband'ssleep.   REVIEW OF SYSTEMS: Out of a complete 14 system review of symptoms, the patient complains only of the following symptoms, headaches, numbness and all other reviewed systems are negative.  ALLERGIES: No Known Allergies  HOME MEDICATIONS: Outpatient Medications Prior to Visit  Medication Sig Dispense Refill   ACCU-CHEK SOFTCLIX LANCETS lancets 1 each by Other route daily. Use  as instructed 100 each 3   Blood Glucose Monitoring Suppl (ACCU-CHEK GUIDE) w/Device KIT 1 kit by Does not apply route 2 (two) times daily. 1 kit 0   cyclobenzaprine (FLEXERIL) 10 MG tablet TK 1 T PO HS PRN  3   DULoxetine (CYMBALTA) 60 MG capsule Take 60 mg by mouth daily.   6   glucose blood (ACCU-CHEK GUIDE) test strip 1 each by Other route daily. Use as instructed 100 each 3   hyoscyamine (ANASPAZ) 0.125 MG TBDP disintergrating tablet Place 0.125 mg under the tongue.     levothyroxine (SYNTHROID) 75 MCG tablet Take 1 tablet (75 mcg total) by mouth daily before breakfast. DUE for follow up 45 tablet 0   metFORMIN (GLUCOPHAGE) 500 MG tablet TAKE 1 TABLET(500 MG) BY MOUTH TWICE DAILY WITH A MEAL 180 tablet 3   metoprolol (LOPRESSOR) 50 MG tablet Take 50 mg by mouth 2 (two) times daily.      Multiple Vitamins-Minerals (ALIVE MULTI-VITAMIN PO) Take by mouth.     Vitamin D, Ergocalciferol, (DRISDOL) 50000 units CAPS capsule Take 1 capsule (50,000 Units total) by mouth every 7 (seven) days. 4 capsule 0   gabapentin (NEURONTIN) 300 MG capsule TAKE 1 CAPSULE(300 MG) BY MOUTH THREE TIMES DAILY 90 capsule 0   topiramate (TOPAMAX) 25 MG tablet Take 2 tablets (50 mg total) by mouth daily. 180 tablet 3   No facility-administered medications prior to visit.     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   HTN (hypertension), benign 04/21/2012   Hypertension     Hyperthyroidism    levels were abnormal in Dec- no treatment currently   Hyperthyroidism 04/21/2012   Infertility, female    Leg edema    PCOS (polycystic ovarian syndrome)    Peripheral vascular disease (HCC)    neuropathy bilateral feet   Prediabetes    Sleep apnea     PAST SURGICAL HISTORY: Past Surgical History:  Procedure Laterality Date  CESAREAN SECTION     DILATATION & CURETTAGE/HYSTEROSCOPY WITH MYOSURE N/A 10/17/2016   Procedure: DILATATION & CURETTAGE/HYSTEROSCOPY WITH MYOSURE POLYPECTOMY;  Surgeon: Paula Compton, MD;  Location: [facility];  Service: Gynecology;  Laterality: N/A;  myosure rep will be here confirmed on 10/06/16   DILATION AND CURETTAGE OF UTERUS     polpectomy   INTRAUTERINE DEVICE (IUD) INSERTION N/A 10/17/2016   Procedure: INTRAUTERINE DEVICE (IUD) INSERTION MIRENA;  Surgeon: Paula Compton, MD;  Location: [facility];  Service: Gynecology;  Laterality: N/A;   TONSILLECTOMY      FAMILY HISTORY: Family History  Problem Relation Age of Onset   Hypertension Mother    Hyperlipidemia Mother    Thyroid disease Mother    Depression Mother    Anxiety disorder Mother    Sleep apnea Mother    Obesity Mother    Heart disease Father    Diabetes Father    Hypertension Father    Hyperlipidemia Father    Obesity Father    Diabetes Maternal Grandmother    Hyperlipidemia Maternal Grandmother    Hyperlipidemia Maternal Grandfather    Hyperlipidemia Paternal Grandmother    Diabetes Paternal Grandmother    Cancer Paternal Grandmother    Heart disease Paternal Grandfather     SOCIAL HISTORY: Social History   Socioeconomic History   Marital status: Married    Spouse name: Vercie Pokorny   Number of children: 1   Years of education: Not on file   Highest education level: Some college, no degree  Occupational History   Occupation: Photographer strain: Not on file   Food  insecurity    Worry: Not on file    Inability: Not on file   Transportation needs    Medical: Not on file    Non-medical: Not on file  Tobacco Use   Smoking status: Never Smoker   Smokeless tobacco: Never Used  Substance and Sexual Activity   Alcohol use: No   Drug use: No   Sexual activity: Yes    Birth control/protection: None  Lifestyle   Physical activity    Days per week: Not on file    Minutes per session: Not on file   Stress: Not on file  Relationships   Social connections    Talks on phone: Not on file    Gets together: Not on file    Attends religious service: Not on file    Active member of club or organization: Not on file    Attends meetings of clubs or organizations: Not on file    Relationship status: Not on file   Intimate partner violence    Fear of current or ex partner: Not on file    Emotionally abused: Not on file    Physically abused: Not on file    Forced sexual activity: Not on file  Other Topics Concern   Not on file  Social History Narrative   Lives at home with her husband, child and dog    Right handed   No caffeine         PHYSICAL EXAM  Vitals:   01/06/19 1453 01/06/19 1457  BP: (!) 156/100 (!) 144/92  Pulse: 72   Temp: 97.9 F (36.6 C)   Weight: (!) 369 lb (167.4 kg)   Height: 5' 8.5" (1.74 m)    Body mass index is 55.29 kg/m.  Generalized: Well developed, in no acute distress  Cardiology: normal rate and rhythm, no murmur  noted Neurological examination  Mentation: Alert oriented to time, place, history taking. Follows all commands speech and language fluent Cranial nerve II-XII: Pupils were equal round reactive to light. Extraocular movements were full, visual field were full on confrontational test. Facial sensation and strength were normal. Uvula tongue midline. Head turning and shoulder shrug  were normal and symmetric. Motor: The motor testing reveals 5 over 5 strength of all 4 extremities. Good symmetric  motor tone is noted throughout.  Sensory: Sensory testing is intact to soft touch on all 4 extremities. No evidence of extinction is noted.  Gait and station: Gait is normal.   DIAGNOSTIC DATA (LABS, IMAGING, TESTING) - I reviewed patient records, labs, notes, testing and imaging myself where available.  No flowsheet data found.   Lab Results  Component Value Date   WBC 8.1 04/23/2017   HGB 13.1 04/23/2017   HCT 39.3 04/23/2017   MCV 88 04/23/2017   PLT 294 10/09/2016      Component Value Date/Time   NA 137 09/08/2017 1021   K 4.4 09/08/2017 1021   CL 100 09/08/2017 1021   CO2 23 09/08/2017 1021   GLUCOSE 133 (H) 09/08/2017 1021   GLUCOSE 149 (H) 10/09/2016 0950   BUN 13 09/08/2017 1021   CREATININE 0.78 09/08/2017 1021   CALCIUM 9.3 09/08/2017 1021   PROT 7.0 09/08/2017 1021   ALBUMIN 4.1 09/08/2017 1021   AST 12 09/08/2017 1021   ALT 13 09/08/2017 1021   ALKPHOS 91 09/08/2017 1021   BILITOT 0.4 09/08/2017 1021   GFRNONAA 93 09/08/2017 1021   GFRAA 107 09/08/2017 1021   Lab Results  Component Value Date   CHOL 238 (H) 09/08/2017   HDL 38 (L) 09/08/2017   LDLCALC 168 (H) 09/08/2017   TRIG 161 (H) 09/08/2017   CHOLHDL 6.3 (H) 09/08/2017   Lab Results  Component Value Date   HGBA1C 6.6 (A) 05/10/2018   Lab Results  Component Value Date   VITAMINB12 398 04/23/2017   Lab Results  Component Value Date   TSH 3.71 05/10/2018       ASSESSMENT AND PLAN 43 y.o. year old female  has a past medical history of Anxiety, Arthritis, Back pain, Blood dyscrasia, Cancer ([facility]), Depression, Diabetes mellitus without complication ([facility]), Factor 5 Leiden mutation, heterozygous ([facility]) (04/21/2012), Headache(784.0), HTN (hypertension), benign (04/21/2012), Hypertension, Hyperthyroidism, Hyperthyroidism (04/21/2012), Infertility, female, Leg edema, PCOS (polycystic ovarian syndrome), Peripheral vascular disease ([facility]), Prediabetes, and Sleep apnea. here with    ICD-10-CM   1. Chronic  daily headache  R51.9   2. OSA on CPAP  G47.33    Z99.89   3. Diabetic polyneuropathy associated with diabetes mellitus due to underlying condition [facility])  E08.42      Katie Harris is doing well today.  Topiramate has reduced the severity of her headaches.  Unfortunately, she does continue to have mild daily headaches.  She is not interested in increasing topiramate at this time.  We have again reviewed history of IIH.  There have been no vision changes.  She will update her eye exam.  MRI was normal.  We will continue current treatment with topiramate 50 mg at bedtime.  I have discussed that we can increase to twice daily dosing if she is willing.  She would prefer to follow-up with endocrinology and PCP as blood sugars and blood pressure have been elevated.  She will continue gabapentin 300 mg in the a.m., 600 mg in the p.m. and 900 mg at night.  Neuropathy  pain has been fairly stable.  She will call with concerns of worsening.  CPAP report reveals excellent compliance.  She was encouraged to continue using CPAP nightly and for greater than 4 hours each night.  She will follow-up in 6 months, sooner if needed.  She verbalizes understanding and agreement with this plan.  No orders of the defined types were placed in this encounter.    Meds ordered this encounter  Medications   topiramate (TOPAMAX) 25 MG tablet    Sig: Take 2 tablets (50 mg total) by mouth daily.    Dispense:  180 tablet    Refill:  3    Order Specific Question:   Supervising Provider    Answer:   Melvenia Beam [[id number]]   gabapentin (NEURONTIN) 300 MG capsule    Sig: Take 2 capsules (600 mg total) by mouth 3 (three) times daily.    Dispense:  180 capsule    Refill:  11    Pt needs to call [phone] to schedule f/u for any future refills    Order Specific Question:   Supervising Provider    Answer:   Melvenia Beam [[id number]]       Katie Ricklefs Ubaldo Glassing, FNP-C 01/06/2019, 3:27 PM [facility] [address] [place], [place] [address] [phone]  Made any corrections needed, and agree with history, physical, neuro exam,assessment and plan as stated.     Sarina Ill, MD [facility]

## 2019-01-06 NOTE — Patient Instructions (Addendum)
Continue topiramate 50mg  at bedtime. We will consider increasing to twice daily dosing once evaluated by endocrinology for elevated CBG's and PCP for BP.   Continue gabapentin 300mg  in am, 600mg  in pm and 900mg  at bedtime  Update eye exam   Follow up in 6 months.   General Headache Without Cause A headache is pain or discomfort that is felt around the head or neck area. There are many causes and types of headaches. In some cases, the cause may not be found. Follow these instructions at home: Watch your condition for any changes. Let your doctor know about them. Take these steps to help with your condition: Managing pain      Take over-the-counter and prescription medicines only as told by your doctor.  Lie down in a dark, quiet room when you have a headache.  If told, put ice on your head and neck area: ? Put ice in a plastic bag. ? Place a towel between your skin and the bag. ? Leave the ice on for 20 minutes, 2-3 times per day.  If told, put heat on the affected area. Use the heat source that your doctor recommends, such as a moist heat pack or a heating pad. ? Place a towel between your skin and the heat source. ? Leave the heat on for 20-30 minutes. ? Remove the heat if your skin turns bright red. This is very important if you are unable to feel pain, heat, or cold. You may have a greater risk of getting burned.  Keep lights dim if bright lights bother you or make your headaches worse. Eating and drinking  Eat meals on a regular schedule.  If you drink alcohol: ? Limit how much you use to:  0-1 drink a day for women.  0-2 drinks a day for men. ? Be aware of how much alcohol is in your drink. In the [place] one drink equals one 12 oz bottle of beer (355 mL), one 5 oz glass of wine (148 mL), or one 1 oz glass of hard liquor (44 mL).  Stop drinking caffeine, or reduce how much caffeine you drink. General instructions   Keep a journal to find out if certain things bring  on headaches. For example, write down: ? What you eat and drink. ? How much sleep you get. ? Any change to your diet or medicines.  Get a massage or try other ways to relax.  Limit stress.  Sit up straight. Do not tighten (tense) your muscles.  Do not use any products that contain nicotine or tobacco. This includes cigarettes, e-cigarettes, and chewing tobacco. If you need help quitting, ask your doctor.  Exercise regularly as told by your doctor.  Get enough sleep. This often means 7-9 hours of sleep each night.  Keep all follow-up visits as told by your doctor. This is important. Contact a doctor if:  Your symptoms are not helped by medicine.  You have a headache that feels different than the other headaches.  You feel sick to your stomach (nauseous) or you throw up (vomit).  You have a fever. Get help right away if:  Your headache gets very bad quickly.  Your headache gets worse after a lot of physical activity.  You keep throwing up.  You have a stiff neck.  You have trouble seeing.  You have trouble speaking.  You have pain in the eye or ear.  Your muscles are weak or you lose muscle control.  You lose your balance  or have trouble walking.  You feel like you will pass out (faint) or you pass out.  You are mixed up (confused).  You have a seizure. Summary  A headache is pain or discomfort that is felt around the head or neck area.  There are many causes and types of headaches. In some cases, the cause may not be found.  Keep a journal to help find out what causes your headaches. Watch your condition for any changes. Let your doctor know about them.  Contact a doctor if you have a headache that is different from usual, or if your headache is not helped by medicine.  Get help right away if your headache gets very bad, you throw up, you have trouble seeing, you lose your balance, or you have a seizure. This information is not intended to replace advice  given to you by your health care provider. Make sure you discuss any questions you have with your health care provider. Document Released: 12/04/2007 Document Revised: 09/14/2017 Document Reviewed: 09/14/2017 Elsevier Patient Education  2020 [employer].

## 2019-01-14 ENCOUNTER — Ambulatory Visit: Payer: Managed Care, Other (non HMO) | Admitting: Internal Medicine

## 2019-01-14 ENCOUNTER — Encounter: Payer: Self-pay | Admitting: Internal Medicine

## 2019-01-14 ENCOUNTER — Other Ambulatory Visit: Payer: Self-pay

## 2019-01-14 VITALS — BP 128/88 | HR 92 | Ht 68.5 in | Wt 369.0 lb

## 2019-01-14 DIAGNOSIS — E0510 Thyrotoxicosis with toxic single thyroid nodule without thyrotoxic crisis or storm: Secondary | ICD-10-CM

## 2019-01-14 DIAGNOSIS — E1140 Type 2 diabetes mellitus with diabetic neuropathy, unspecified: Secondary | ICD-10-CM

## 2019-01-14 DIAGNOSIS — Z23 Encounter for immunization: Secondary | ICD-10-CM

## 2019-01-14 DIAGNOSIS — E890 Postprocedural hypothyroidism: Secondary | ICD-10-CM | POA: Diagnosis not present

## 2019-01-14 LAB — POCT GLYCOSYLATED HEMOGLOBIN (HGB A1C): Hemoglobin A1C: 7.9 % — AB (ref 4.0–5.6)

## 2019-01-14 MED ORDER — METFORMIN HCL 850 MG PO TABS: 850.0000 mg | ORAL_TABLET | Freq: Two times a day (BID) | ORAL | 3 refills | Status: DC

## 2019-01-14 MED ORDER — OZEMPIC (0.25 OR 0.5 MG/DOSE) 2 MG/1.5ML ~~LOC~~ SOPN: 0.5000 mg | PEN_INJECTOR | SUBCUTANEOUS | 5 refills | Status: DC

## 2019-01-14 NOTE — Progress Notes (Signed)
Patient ID: Katie Harris, female   DOB: 02/05/1976, 43 y.o.   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 Last visit 8 months ago.  She was dx'ed with IBS 2 weeks ago >> will see a GI dr.  Reviewed history: Pt was diagnosed with thyrotoxicosis in 11/2014. At that time, she was taking several thyrotoxic supplements and also took Biotin.  Before our first visit, she was on biotin, B6 vitamin, [place], but was previously on [place], [employer] - stopped 3-4 weeks prior to the appt. I advised her to stay off all the supplements and hold the Biotin few days before next TFTs.  TSI antibodies were not elevated.   TFTs continued to fluctuate but she continued to have suppressed TSH and elevated free T3.  Thyroid Uptake and scan which she had on 10/25/2015: Elevated 24 hour radio iodine uptake of 42%.  Large hot nodule within LEFT thyroid lobe with suppression of uptake within the RIGHT lobe consistent with a toxic adenoma.   11/09/2015: RAI treatment  She developed post ablative hypothyroidism afterwards.  We started on levothyroxine 50 mcg daily and increase the dose at last visit, in 05/2018.  She takes 75 mcg daily: - in am - fasting - at least 30 min from b'fast - no Ca, Fe, MVI, PPIs - not on Biotin  She did not return for labs after decreasing levothyroxine dose at last visit.  Reviewed her TFTs: Lab Results  Component Value Date   TSH 3.71 05/10/2018   TSH 3.35 12/22/2017   TSH 6.830 (H) 09/08/2017   TSH 4.780 (H) 04/23/2017   TSH 4.260 01/06/2017   TSH 2.78 07/29/2016   TSH 2.37 03/31/2016   TSH 0.11 (L) 12/21/2015   TSH 0.03 (L) 10/15/2015   TSH 0.18 (L) 08/31/2015   FREET4 0.95 05/10/2018   FREET4 0.84 12/22/2017   FREET4 0.90 09/08/2017   FREET4 0.95 04/23/2017   FREET4 0.72  07/29/2016   FREET4 0.55 (L) 03/31/2016   FREET4 0.89 12/21/2015   FREET4 1.60 10/15/2015   FREET4 1.32 08/31/2015   FREET4 1.52 07/19/2015   T3FREE 3.3 07/29/2016   T3FREE 2.7 03/31/2016   T3FREE 3.8 12/21/2015   T3FREE 4.5 (H) 10/15/2015   T3FREE 5.0 (H) 08/31/2015   T3FREE 5.2 (H) 07/19/2015  11/07/2014: TSH <0.01, fT4 1.54 (0.61-1.18)  Her TSI antibodies were not elevated: Lab Results  Component Value Date   TSI <89 07/19/2015   Pt denies: - feeling nodules in neck - hoarseness - dysphagia - choking - SOB with lying down  Pt does have a FH of thyroid ds: hypothyroidism in mother, possibly MGM. No FH of thyroid cancer. No h/o radiation tx to head or neck except for RAI treatment.  No herbal supplements. No Biotin use. No recent steroids use.   She also has a history of PCOS and pseudotumor cerebri.  DM2:  At last visit she was off her Metformin as she ran out.  We restarted: - Metformin 500 mg 2x  a day with meals >> Metformin 850 mg 2x a day - started 2 weeks ago -she does not feel that this is causing any GI problems  She was not checking sugars at last visit.  Now checking 2x a day: - am:181-200 - 2h after b'fast: 159-245 - lunch: 138, 197-291 - 2h after lunch: 174-219 -  dinner: 165-246 - 2h after dinner: 204 - bedtime: 182  Reviewed HbA1c levels: Lab Results  Component Value Date   HGBA1C 6.6 (A) 05/10/2018   HGBA1C 5.9 (H) 09/08/2017   HGBA1C 7.4 (H) 04/23/2017   ROS: Constitutional: no weight gain/no weight loss, no fatigue, no subjective hyperthermia, no subjective hypothermia Eyes: no blurry vision, no xerophthalmia ENT: no sore throat, + see HPI Cardiovascular: no CP/no SOB/no palpitations/no leg swelling Respiratory: no cough/no SOB/no wheezing Gastrointestinal: no N/no V/+ D/no C/no acid reflux Musculoskeletal: no muscle aches/no joint aches Skin: no rashes, no hair loss Neurological: no tremors/no numbness/no tingling/no dizziness  I  reviewed pt's medications, allergies, PMH, social hx, family hx, and changes were documented in the history of present illness. Otherwise, unchanged from my initial visit note.  Past Medical History:  Diagnosis Date  . Anxiety   . Arthritis    lower back  . Back pain   . 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 Cancer ([place])    basal cell removed from left hairline area  . Depression   . Diabetes mellitus without complication ([place])    recent A1C 6.5 no meds presently  . Factor 5 Leiden mutation, heterozygous ([place]) 04/21/2012   Lab 04/16/12 in view of positive Leiden in her father; no personal hx of thrombosis despite prior pregnancy & C-section 2007  . 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 HTN (hypertension), benign 04/21/2012  . Hypertension   . Hyperthyroidism    levels were abnormal in Dec- no treatment currently  . Hyperthyroidism 04/21/2012  . Infertility, female   . Leg edema   . PCOS (polycystic ovarian syndrome)   . Peripheral vascular disease ([facility])    neuropathy bilateral feet  . Prediabetes   . Sleep apnea    Past Surgical History:  Procedure Laterality Date  . CESAREAN SECTION    . DILATATION & CURETTAGE/HYSTEROSCOPY WITH MYOSURE N/A 10/17/2016   Procedure: DILATATION & CURETTAGE/HYSTEROSCOPY WITH MYOSURE POLYPECTOMY;  Surgeon: Paula Compton, MD;  Location: [facility];  Service: Gynecology;  Laterality: N/A;  myosure rep will be here confirmed on 10/06/16  . DILATION AND CURETTAGE OF UTERUS     polpectomy  . INTRAUTERINE DEVICE (IUD) INSERTION N/A 10/17/2016   Procedure: INTRAUTERINE DEVICE (IUD) INSERTION MIRENA;  Surgeon: Paula Compton, MD;  Location: [facility];  Service: Gynecology;  Laterality: N/A;  . TONSILLECTOMY     Social History   Social History  . Marital Status: Married    Spouse Name: N/A  . Number of Children: 1   Occupational History  . Project coordinator   Social History Main Topics  . Smoking status: Never Smoker   .  Smokeless tobacco: Not on file  . Alcohol Use: No  . Drug Use: No   Current Outpatient Medications on File Prior to Visit  Medication Sig Dispense Refill  . ACCU-CHEK SOFTCLIX LANCETS lancets 1 each by Other route daily. Use as instructed 100 each 3  . Blood Glucose Monitoring Suppl (ACCU-CHEK GUIDE) w/Device KIT 1 kit by Does not apply route 2 (two) times daily. 1 kit 0  . cyclobenzaprine (FLEXERIL) 10 MG tablet TK 1 T PO HS PRN  3  . DULoxetine (CYMBALTA) 60 MG capsule Take 60 mg by mouth daily.   6  . gabapentin (NEURONTIN) 300 MG capsule Take 2 capsules (600 mg total) by mouth 3 (three) times daily. 180 capsule 11  . glucose blood (ACCU-CHEK GUIDE)  test strip 1 each by Other route daily. Use as instructed 100 each 3  . hyoscyamine (ANASPAZ) 0.125 MG TBDP disintergrating tablet Place 0.125 mg under the tongue.    Marland Kitchen levothyroxine (SYNTHROID) 75 MCG tablet Take 1 tablet (75 mcg total) by mouth daily before breakfast. DUE for follow up 45 tablet 0  . metFORMIN (GLUCOPHAGE) 500 MG tablet TAKE 1 TABLET(500 MG) BY MOUTH TWICE DAILY WITH A MEAL 180 tablet 3  . metoprolol (LOPRESSOR) 50 MG tablet Take 50 mg by mouth 2 (two) times daily.     . Multiple Vitamins-Minerals (ALIVE MULTI-VITAMIN PO) Take by mouth.    . topiramate (TOPAMAX) 25 MG tablet Take 2 tablets (50 mg total) by mouth daily. 180 tablet 3  . Vitamin D, Ergocalciferol, (DRISDOL) 50000 units CAPS capsule Take 1 capsule (50,000 Units total) by mouth every 7 (seven) days. 4 capsule 0   No current facility-administered medications on file prior to visit.    No Known Allergies Family History  Problem Relation Age of Onset  . Hypertension Mother   . Hyperlipidemia Mother   . Thyroid disease Mother   . Depression Mother   . Anxiety disorder Mother   . Sleep apnea Mother   . Obesity Mother   . Heart disease Father   . Diabetes Father   . Hypertension Father   . Hyperlipidemia Father   . Obesity Father   . Diabetes Maternal  Grandmother   . Hyperlipidemia Maternal Grandmother   . Hyperlipidemia Maternal Grandfather   . Hyperlipidemia Paternal Grandmother   . Diabetes Paternal Grandmother   . Cancer Paternal Grandmother   . Heart disease Paternal Grandfather     PE: BP 128/88   Pulse 92   Ht 5' 8.5" (1.74 m)   Wt (!) 369 lb (167.4 kg)   SpO2 98%   BMI 55.29 kg/m  Wt Readings from Last 3 Encounters:  01/14/19 (!) 369 lb (167.4 kg)  01/06/19 (!) 369 lb (167.4 kg)  05/10/18 (!) 374 lb (169.6 kg)   Constitutional: overweight, in NAD Eyes: PERRLA, EOMI, no exophthalmos ENT: moist mucous membranes, no thyromegaly, no cervical lymphadenopathy Cardiovascular: RRR, No MRG Respiratory: CTA B Gastrointestinal: abdomen soft, NT, ND, BS+ Musculoskeletal: no deformities, strength intact in all 4 Skin: moist, warm, no rashes Neurological: no tremor with outstretched hands, DTR normal in all 4  ASSESSMENT: 1. Toxic thyroid nodule - s/p ablation  2.  Post ablative hypothyroidism  3. DM2  PLAN:  1. Patient with history of thyrotoxicosis with a diagnosis of left toxic adenoma, now status post RAI treatment in 11/2015.  Subsequent TFTs were normal in 03/2016 and also in 07/2016.  Afterwards, her TSH started to increase. -She has tachycardia and is on beta-blockers for blood pressure control -No further investigation is needed for her thyroid nodule, since this was overproducing hormones and therefore at low risk for cancer -No neck compression symptoms  2.  Post ablative hypothyroidism - latest thyroid labs reviewed with pt >> normal but close to the upper limit of normal and we increase her levothyroxine dose then.  She did not return for follow-up labs afterwards. Lab Results  Component Value Date   TSH 3.71 05/10/2018  - she continues on LT4 75 mcg daily - pt feels good on this dose. - we discussed about taking the thyroid hormone every day, with water, >30 minutes before breakfast, separated by >4  hours from acid reflux medications, calcium, iron, multivitamins. Pt. is taking it correctly. - will  check thyroid tests today: TSH and fT4 - If labs are abnormal, she will need to return for repeat TFTs in 1.5 months  3. DM2 -Diagnosed this year, Now worse -Her HbA1c improved significantly after starting Metformin, but at last visit, it was slightly higher: 6.6%.,  Most likely due to weight gain.  She restarted to see Dr. Leafy Ro, but she is not seeing her anymore.  She lost 5 pounds since last visit. -At this visit, we reviewed her meter download: Sugars are almost uniformly high, in the 200s.  She had 1 very high blood sugar in the 300s since last visit. -Since last visit, she increased her Metformin dose after she received some samples from a friend.  I agree with increasing the dose to 850 mg twice a day and sent a prescription to her pharmacy.  However, this is not enough and we discussed about adding a GLP-1 receptor agonist to her regimen.  Discussed about benefits and possible side effects.  She agrees to start Ozempic. -We also discussed about improving her diet To reduce her insulin resistanceand I suggested a more plant-based diet, lower in fat.  Given written references. -I advised her to: Patient Instructions  Please continue: - Metformin 850 mg 2x a day  Please start Ozempic 0.25 mg weekly in a.m. (for example on Sunday morning) x 4 weeks, then increase to 0.5 mg weekly in a.m. if no nausea or hypoglycemia.  Please return in 3 months with your sugar log.   -She continues on Cymbalta, Neurontin, and alpha-lipoic acid for neuropathy - we checked her HbA1c: 7.9% (higher) - advised to check sugars at different times of the day - 2-3x a day, rotating check times - + Flu shot today - return to clinic in 3 months   Office Visit on 01/14/2019  Component Date Value Ref Range Status  . Hemoglobin A1C 01/14/2019 7.9* 4.0 - 5.6 % Final  . TSH 01/14/2019 2.57  mIU/L Final   Comment:            Reference Range .           > or = 20 Years  0.40-4.50 .                Pregnancy Ranges           First trimester    0.26-2.66           Second trimester   0.55-2.73           Third trimester    0.43-2.91   . Free T4 01/14/2019 1.0  0.8 - 1.8 ng/dL Final   Thyroid tests are better.  Philemon Kingdom, MD PhD [facility] Endocrinology

## 2019-01-14 NOTE — Patient Instructions (Addendum)
Please continue: - Metformin 850 mg 2x a day  Please start Ozempic 0.25 mg weekly in a.m. (for example on Sunday morning) x 4 weeks, then increase to 0.5 mg weekly in a.m. if no nausea or hypoglycemia.  Please return in 3 months with your sugar log.    Please consider the following ways to cut down carbs and fat and increase fiber and micronutrients in your diet: - substitute whole grain for white bread or pasta - substitute brown rice for white rice - substitute 90-calorie flat bread pieces for slices of bread when possible - substitute sweet potatoes or yams for white potatoes - substitute humus for margarine - substitute tofu for cheese when possible - substitute almond or rice milk for regular milk (would not drink soy milk daily due to concern for soy estrogen influence on breast cancer risk) - substitute dark chocolate for other sweets when possible - substitute water - can add lemon or orange slices for taste - for diet sodas (artificial sweeteners will trick your body that you can eat sweets without getting calories and will lead you to overeating and weight gain in the long run) - do not skip breakfast or other meals (this will slow down the metabolism and will result in more weight gain over time)  - can try smoothies made from fruit and almond/rice milk in am instead of regular breakfast - can also try old-fashioned (not instant) oatmeal made with almond/rice milk in am - order the dressing on the side when eating salad at a restaurant (pour less than half of the dressing on the salad) - eat as little meat as possible - can try juicing, but should not forget that juicing will get rid of the fiber, so would alternate with eating raw veg./fruits or drinking smoothies - use as little oil as possible, even when using olive oil - can dress a salad with a mix of balsamic vinegar and lemon juice, for e.g. - use agave nectar, stevia sugar, or regular sugar rather than artificial  sweateners - steam or broil/roast veggies  - snack on veggies/fruit/nuts (unsalted, preferably) when possible, rather than processed foods - reduce or eliminate aspartame in diet (it is in diet sodas, chewing gum, etc) Read the labels!  Try to read Dr. Janene Harvey book: "Program for Reversing Diabetes" for other ideas for healthy eating.  Look up: - PCRM website - Engine 2 diet  Watch: - [place] - What the Health

## 2019-01-15 LAB — TSH: TSH: 2.57 mIU/L

## 2019-01-15 LAB — T4, FREE: Free T4: 1 ng/dL (ref 0.8–1.8)

## 2019-01-17 ENCOUNTER — Other Ambulatory Visit: Payer: Self-pay | Admitting: Internal Medicine

## 2019-01-24 ENCOUNTER — Encounter: Payer: Self-pay | Admitting: Internal Medicine

## 2019-01-24 DIAGNOSIS — E119 Type 2 diabetes mellitus without complications: Secondary | ICD-10-CM

## 2019-01-25 MED ORDER — ACCU-CHEK GUIDE VI STRP: 1.0000 | ORAL_STRIP | Freq: Every day | 3 refills | Status: DC

## 2019-01-25 MED ORDER — ACCU-CHEK SOFTCLIX LANCETS MISC: 1.0000 | Freq: Every day | 3 refills | Status: DC

## 2019-01-25 MED ORDER — ACCU-CHEK FASTCLIX LANCETS MISC: 11 refills | Status: DC

## 2019-01-25 NOTE — Addendum Note (Signed)
Addended by: Cardell Peach I on: 01/25/2019 08:46 AM   Modules accepted: Orders

## 2019-04-21 ENCOUNTER — Ambulatory Visit: Payer: Managed Care, Other (non HMO) | Admitting: Internal Medicine

## 2019-04-21 ENCOUNTER — Encounter: Payer: Self-pay | Admitting: Internal Medicine

## 2019-04-21 ENCOUNTER — Other Ambulatory Visit: Payer: Self-pay

## 2019-04-21 VITALS — BP 120/70 | HR 96 | Ht 68.5 in | Wt 346.0 lb

## 2019-04-21 DIAGNOSIS — E785 Hyperlipidemia, unspecified: Secondary | ICD-10-CM

## 2019-04-21 DIAGNOSIS — E1140 Type 2 diabetes mellitus with diabetic neuropathy, unspecified: Secondary | ICD-10-CM | POA: Diagnosis not present

## 2019-04-21 DIAGNOSIS — E119 Type 2 diabetes mellitus without complications: Secondary | ICD-10-CM | POA: Diagnosis not present

## 2019-04-21 DIAGNOSIS — Z6841 Body Mass Index (BMI) 40.0 and over, adult: Secondary | ICD-10-CM

## 2019-04-21 LAB — POCT GLYCOSYLATED HEMOGLOBIN (HGB A1C): Hemoglobin A1C: 5.8 % — AB (ref 4.0–5.6)

## 2019-04-21 NOTE — Patient Instructions (Signed)
Please continue: - Metformin 850 mg 2x a day - Ozempic 0.5 mg weekly  Please return in 4-6 months with your sugar log.

## 2019-04-21 NOTE — Progress Notes (Signed)
Patient ID: Katie Harris, female   DOB: [birth year], 44 y.o.   MRN: [id number]  This visit occurred during the SARS-CoV-2 public health emergency.  Safety protocols were in place, including screening questions prior to the visit, additional usage of staff PPE, and extensive cleaning of exam room while observing appropriate contact time as indicated for disinfecting solutions.   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 Last visit 3 mo ago.  Reviewed and addended history: Pt was diagnosed with thyrotoxicosis in 11/2014. At that time, she was taking several thyrotoxic supplements and also took Biotin.  Before our first visit, she was on biotin, B6 vitamin, [place], but was previously on [place], [employer] - stopped 3-4 weeks prior to the appt. I advised her to stay off all the supplements and hold the Biotin few days before next TFTs.  Her TSI antibodies were not elevated.   TFTs continued to fluctuate but she continued to have suppressed TSH and elevated free T3.  Thyroid Uptake and scan which she had on 10/25/2015: Elevated 24 hour radio iodine uptake of 42%.  Large hot nodule within LEFT thyroid lobe with suppression of uptake within the RIGHT lobe consistent with a toxic adenoma.   11/09/2015: RAI treatment  She developed post ablative hypothyroidism afterwards.  We started on levothyroxine 50 mcg daily and increased the dose to 75 mcg daily in 05/2018.  She takes this: - in am - fasting - at least 30 min from b'fast - no Ca, Fe, MVI, PPIs - not on Biotin  Reviewed her TFTs: Lab Results  Component Value Date   TSH 2.57 01/14/2019   TSH 3.71 05/10/2018   TSH 3.35 12/22/2017   TSH 6.830 (H) 09/08/2017   TSH 4.780 (H) 04/23/2017   TSH 4.260 01/06/2017   TSH 2.78 07/29/2016   TSH 2.37  03/31/2016   TSH 0.11 (L) 12/21/2015   TSH 0.03 (L) 10/15/2015   FREET4 1.0 01/14/2019   FREET4 0.95 05/10/2018   FREET4 0.84 12/22/2017   FREET4 0.90 09/08/2017   FREET4 0.95 04/23/2017   FREET4 0.72 07/29/2016   FREET4 0.55 (L) 03/31/2016   FREET4 0.89 12/21/2015   FREET4 1.60 10/15/2015   FREET4 1.32 08/31/2015   T3FREE 3.3 07/29/2016   T3FREE 2.7 03/31/2016   T3FREE 3.8 12/21/2015   T3FREE 4.5 (H) 10/15/2015   T3FREE 5.0 (H) 08/31/2015   T3FREE 5.2 (H) 07/19/2015  11/07/2014: TSH <0.01, fT4 1.54 (0.61-1.18)  TSI's were not elevated: Lab Results  Component Value Date   TSI <89 07/19/2015   Pt denies: - feeling nodules in neck - hoarseness - dysphagia - choking - SOB with lying down  Pt does have a FH of thyroid ds: hypothyroidism in mother, possibly MGM. No FH of thyroid cancer. No h/o radiation tx to head or neck except for RAI treatment.  No herbal supplements. No Biotin use. No recent steroids use.   She also has a history of PCOS and pseudotumor cerebri.  DM2:  She is currently on: - Metformin 500 mg >> 850 mg 2x a day  -she does not feel that this is causing any GI problems - Ozempic 0.5 mg weekly - started 01/2019 -a little nausea  She checks sugars 1x a day: - am:181-200 >> 136, 146 - 2h after b'fast: 159-245 >> 142 - lunch: 138, 197-291 >> 116 -  2h after lunch: 174-219 >> 127, 136, 167 - dinner: 165-246 >> 120 - 2h after dinner: 204 >> n/c - bedtime: 182 >> n/c  Reviewed HbA1c: Lab Results  Component Value Date   HGBA1C 7.9 (A) 01/14/2019   HGBA1C 6.6 (A) 05/10/2018   HGBA1C 5.9 (H) 09/08/2017   HGBA1C 7.4 (H) 04/23/2017   Lab Results  Component Value Date   BUN 13 09/08/2017   Lab Results  Component Value Date   CREATININE 0.78 09/08/2017   CREATININE 0.71 04/23/2017   CREATININE 0.68 01/06/2017   CREATININE 0.62 10/09/2016   CREATININE 0.65 07/09/2016   CREATININE 0.62 03/22/2012   Lab Results  Component Value Date   CHOL 238  (H) 09/08/2017   HDL 38 (L) 09/08/2017   LDLCALC 168 (H) 09/08/2017   TRIG 161 (H) 09/08/2017   CHOLHDL 6.3 (H) 09/08/2017   Last eye exam was in 02/2018: No DR  ROS: Constitutional: no weight gain/+ weight loss, no fatigue, no subjective hyperthermia, no subjective hypothermia Eyes: no blurry vision, no xerophthalmia ENT: no sore throat, + see HPI Cardiovascular: no CP/no SOB/no palpitations/no leg swelling Respiratory: no cough/no SOB/no wheezing Gastrointestinal: + N/no V/no D/no C/no acid reflux Musculoskeletal: no muscle aches/no joint aches Skin: no rashes, no hair loss Neurological: no tremors/no numbness/no tingling/no dizziness  I reviewed pt's medications, allergies, PMH, social hx, family hx, and changes were documented in the history of present illness. Otherwise, unchanged from my initial visit note.  Past Medical History:  Diagnosis Date  . Anxiety   . Arthritis    lower back  . Back pain   . 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 Cancer ([place])    basal cell removed from left hairline area  . Depression   . Diabetes mellitus without complication ([place])    recent A1C 6.5 no meds presently  . Factor 5 Leiden mutation, heterozygous ([place]) 04/21/2012   Lab 04/16/12 in view of positive Leiden in her father; no personal hx of thrombosis despite prior pregnancy & C-section 2007  . 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 HTN (hypertension), benign 04/21/2012  . Hypertension   . Hyperthyroidism    levels were abnormal in Dec- no treatment currently  . Hyperthyroidism 04/21/2012  . Infertility, female   . Leg edema   . PCOS (polycystic ovarian syndrome)   . Peripheral vascular disease (HCC)    neuropathy bilateral feet  . Prediabetes   . Sleep apnea    Past Surgical History:  Procedure Laterality Date  . CESAREAN SECTION    . DILATATION & CURETTAGE/HYSTEROSCOPY WITH MYOSURE N/A 10/17/2016   Procedure: DILATATION & CURETTAGE/HYSTEROSCOPY WITH  MYOSURE POLYPECTOMY;  Surgeon: Paula Compton, MD;  Location: [facility];  Service: Gynecology;  Laterality: N/A;  myosure rep will be here confirmed on 10/06/16  . DILATION AND CURETTAGE OF UTERUS     polpectomy  . INTRAUTERINE DEVICE (IUD) INSERTION N/A 10/17/2016   Procedure: INTRAUTERINE DEVICE (IUD) INSERTION MIRENA;  Surgeon: Paula Compton, MD;  Location: [facility];  Service: Gynecology;  Laterality: N/A;  . TONSILLECTOMY     Social History   Social History  . Marital Status: Married    Spouse Name: N/A  . Number of Children: 1   Occupational History  . Project coordinator   Social History Main Topics  . Smoking status: Never Smoker   . Smokeless tobacco: Not on file  . Alcohol Use: No  . Drug  Use: No   Current Outpatient Medications on File Prior to Visit  Medication Sig Dispense Refill  . Accu-Chek FastClix Lancets MISC Use to check blood sugar once a day 100 each 11  . Blood Glucose Monitoring Suppl (ACCU-CHEK GUIDE) w/Device KIT 1 kit by Does not apply route 2 (two) times daily. 1 kit 0  . cyclobenzaprine (FLEXERIL) 10 MG tablet TK 1 T PO HS PRN  3  . DULoxetine (CYMBALTA) 60 MG capsule Take 60 mg by mouth daily.   6  . gabapentin (NEURONTIN) 300 MG capsule Take 2 capsules (600 mg total) by mouth 3 (three) times daily. 180 capsule 11  . glucose blood (ACCU-CHEK GUIDE) test strip 1 each by Other route daily. Use as instructed 100 each 3  . hyoscyamine (ANASPAZ) 0.125 MG TBDP disintergrating tablet Place 0.125 mg under the tongue.    Marland Kitchen levothyroxine (SYNTHROID) 75 MCG tablet TAKE 1 TABLET(75 MCG) BY MOUTH DAILY BEFORE BREAKFAST. FOLLOW UP 45 tablet 4  . metFORMIN (GLUCOPHAGE) 850 MG tablet Take 1 tablet (850 mg total) by mouth 2 (two) times daily with a meal. 180 tablet 3  . metoprolol (LOPRESSOR) 50 MG tablet Take 50 mg by mouth 2 (two) times daily.     . Multiple Vitamins-Minerals (ALIVE MULTI-VITAMIN PO) Take by mouth.    . Semaglutide,0.25 or 0.'5MG'$ /DOS, (OZEMPIC, 0.25  OR 0.5 MG/DOSE,) 2 MG/1.5ML SOPN Inject 0.5 mg into the skin once a week. 2 pen 5  . topiramate (TOPAMAX) 25 MG tablet Take 2 tablets (50 mg total) by mouth daily. 180 tablet 3  . Vitamin D, Ergocalciferol, (DRISDOL) 50000 units CAPS capsule Take 1 capsule (50,000 Units total) by mouth every 7 (seven) days. 4 capsule 0   No current facility-administered medications on file prior to visit.   No Known Allergies Family History  Problem Relation Age of Onset  . Hypertension Mother   . Hyperlipidemia Mother   . Thyroid disease Mother   . Depression Mother   . Anxiety disorder Mother   . Sleep apnea Mother   . Obesity Mother   . Heart disease Father   . Diabetes Father   . Hypertension Father   . Hyperlipidemia Father   . Obesity Father   . Diabetes Maternal Grandmother   . Hyperlipidemia Maternal Grandmother   . Hyperlipidemia Maternal Grandfather   . Hyperlipidemia Paternal Grandmother   . Diabetes Paternal Grandmother   . Cancer Paternal Grandmother   . Heart disease Paternal Grandfather     PE: BP 120/70   Pulse 96   Ht 5' 8.5" (1.74 m)   Wt (!) 346 lb (156.9 kg)   SpO2 97%   BMI 51.84 kg/m  Wt Readings from Last 3 Encounters:  04/21/19 (!) 346 lb (156.9 kg)  01/14/19 (!) 369 lb (167.4 kg)  01/06/19 (!) 369 lb (167.4 kg)   Constitutional: overweight, in NAD Eyes: PERRLA, EOMI, no exophthalmos ENT: moist mucous membranes, no thyromegaly, no cervical lymphadenopathy Cardiovascular: RRR, No MRG Respiratory: CTA B Gastrointestinal: abdomen soft, NT, ND, BS+ Musculoskeletal: no deformities, strength intact in all 4 Skin: moist, warm, no rashes Neurological: no tremor with outstretched hands, DTR normal in all 4  ASSESSMENT: 1.  Post ablative hypothyroidism -After RAI treatment for toxic adenoma  2. DM2, non-insulin-dependent,  with complications -Peripheral neuropathy  3.  Obesity class III  PLAN:  1. Patient with history of thyrotoxicosis, with a diagnosis of  left toxic adenoma, now status post RAI treatment in 11/2015, after which she  developed post ablative hypothyroidism. -No neck compression symptoms -  latest TFTs were normal at last visit: Lab Results  Component Value Date   TSH 2.57 01/14/2019  - she continues on LT4 75 mcg daily - pt feels good on this dose. - we discussed about taking the thyroid hormone every day, with water, >30 minutes before breakfast, separated by >4 hours from acid reflux medications, calcium, iron, multivitamins. Pt. is taking it correctly.  3. DM2 -Diagnosed in 2019 -At last visit she was only on Metformin but sugars increased and they were almost uniformly high, in the 200s -We added Ozempic at that time and her sugars improved significantly at this visit.  She is tolerating Ozempic well, but has occasional nausea which she does not necessarily feel is related to Ozempic.  She would like to continue with this. -I advised her to: Patient Instructions  Please continue: - Metformin 850 mg 2x a day - Ozempic 0.5 mg weekly  Please return in 4-6 months with your sugar log.   - we checked her HbA1c: 5.8% (dramatic decrease) - advised to check sugars at different times of the day - 1x a day, rotating check times - advised for yearly eye exams >> she is not UTD - return to clinic in 4-6 months  3.  Obesity class III -She lost 23 pounds since last visit -Continue Ozempic, which definitely helps with weight loss  Component     Latest Ref Rng & Units 04/21/2019  Sodium     135 - 145 mEq/L 137  Potassium     3.5 - 5.1 mEq/L 4.0  Chloride     96 - 112 mEq/L 105  CO2     19 - 32 mEq/L 23  Glucose     70 - 99 mg/dL 107 (H)  BUN     6 - 23 mg/dL 9  Creatinine     0.40 - 1.20 mg/dL 0.70  Total Bilirubin     0.2 - 1.2 mg/dL 0.3  Alkaline Phosphatase     39 - 117 U/L 64  AST     0 - 37 U/L 16  ALT     0 - 35 U/L 17  Total Protein     6.0 - 8.3 g/dL 7.5  Albumin     3.5 - 5.2 g/dL 4.2  GFR      >60.00 mL/min 90.16  Calcium     8.4 - 10.5 mg/dL 9.2  Cholesterol     0 - 200 mg/dL 209 (H)  Triglycerides     0.0 - 149.0 mg/dL 233.0 (H)  HDL Cholesterol     >39.00 mg/dL 35.60 (L)  VLDL     0.0 - 40.0 mg/dL 46.6 (H)  Total CHOL/HDL Ratio      6  NonHDL      173.43  Microalb, Ur     0.0 - 1.9 mg/dL 0.8  Creatinine,U     mg/dL 147.2  MICROALB/CREAT RATIO     0.0 - 30.0 mg/g 0.5  Hemoglobin A1C     4.0 - 5.6 % 5.8 (A)  Direct LDL     mg/dL 143.0   LDL slightly better, triglycerides higher. She continues to work on weight loss and improved her diabetes already.  We will continue to monitor at next visit. ACR normal.   Philemon Kingdom, MD PhD [facility] Endocr[facility]

## 2019-04-22 LAB — COMPREHENSIVE METABOLIC PANEL
ALT: 17 U/L (ref 0–35)
AST: 16 U/L (ref 0–37)
Albumin: 4.2 g/dL (ref 3.5–5.2)
Alkaline Phosphatase: 64 U/L (ref 39–117)
BUN: 9 mg/dL (ref 6–23)
CO2: 23 mEq/L (ref 19–32)
Calcium: 9.2 mg/dL (ref 8.4–10.5)
Chloride: 105 mEq/L (ref 96–112)
Creatinine, Ser: 0.7 mg/dL (ref 0.40–1.20)
GFR: 90.16 mL/min (ref >60.00)
Glucose, Bld: 107 mg/dL — ABNORMAL HIGH (ref 70–99)
Potassium: 4 mEq/L (ref 3.5–5.1)
Sodium: 137 mEq/L (ref 135–145)
Total Bilirubin: 0.3 mg/dL (ref 0.2–1.2)
Total Protein: 7.5 g/dL (ref 6.0–8.3)

## 2019-04-22 LAB — LIPID PANEL
Cholesterol: 209 mg/dL — ABNORMAL HIGH (ref 0–200)
HDL: 35.6 mg/dL — ABNORMAL LOW (ref >39.00)
NonHDL: 173.43
Total CHOL/HDL Ratio: 6
Triglycerides: 233 mg/dL — ABNORMAL HIGH (ref 0.0–149.0)
VLDL: 46.6 mg/dL — ABNORMAL HIGH (ref 0.0–40.0)

## 2019-04-22 LAB — MICROALBUMIN / CREATININE URINE RATIO
Creatinine,U: 147.2 mg/dL
Microalb Creat Ratio: 0.5 mg/g (ref 0.0–30.0)
Microalb, Ur: 0.8 mg/dL (ref 0.0–1.9)

## 2019-04-22 LAB — LDL CHOLESTEROL, DIRECT: Direct LDL: 143 mg/dL

## 2019-05-31 ENCOUNTER — Encounter: Payer: Self-pay | Admitting: Family Medicine

## 2019-06-02 ENCOUNTER — Ambulatory Visit: Payer: Managed Care, Other (non HMO) | Attending: Internal Medicine

## 2019-06-02 DIAGNOSIS — Z23 Encounter for immunization: Secondary | ICD-10-CM

## 2019-06-02 NOTE — Progress Notes (Signed)
   Covid-19 Vaccination Clinic  Name:  ZYELLE MANCE    MRN: [id number] DOB: [birth year]  06/02/2019  Ms. Yardley was observed post Covid-19 immunization for 15 minutes without incident. She was provided with Vaccine Information Sheet and instruction to access the V-Safe system.   Ms. Molenda was instructed to call 911 with any severe reactions post vaccine: Marland Kitchen Difficulty breathing  . Swelling of face and throat  . A fast heartbeat  . A bad rash all over body  . Dizziness and weakness   Immunizations Administered    Name Date Dose VIS Date Route   Pfizer COVID-19 Vaccine 06/02/2019 12:31 PM 0.3 mL 02/18/2019 Intramuscular   Manufacturer: [place]   Lot: [id number]   Vivian: [id number]

## 2019-06-09 ENCOUNTER — Other Ambulatory Visit (INDEPENDENT_AMBULATORY_CARE_PROVIDER_SITE_OTHER): Payer: Managed Care, Other (non HMO)

## 2019-06-09 ENCOUNTER — Encounter: Payer: Self-pay | Admitting: Gastroenterology

## 2019-06-09 ENCOUNTER — Ambulatory Visit: Payer: Managed Care, Other (non HMO) | Admitting: Gastroenterology

## 2019-06-09 VITALS — BP 128/78 | HR 92 | Temp 97.8°F | Ht 68.0 in | Wt 339.0 lb

## 2019-06-09 DIAGNOSIS — R110 Nausea: Secondary | ICD-10-CM

## 2019-06-09 DIAGNOSIS — R197 Diarrhea, unspecified: Secondary | ICD-10-CM

## 2019-06-09 DIAGNOSIS — R109 Unspecified abdominal pain: Secondary | ICD-10-CM

## 2019-06-09 DIAGNOSIS — Z8379 Family history of other diseases of the digestive system: Secondary | ICD-10-CM

## 2019-06-09 LAB — CBC WITH DIFFERENTIAL/PLATELET
Basophils Absolute: 0.1 10*3/uL (ref 0.0–0.1)
Basophils Relative: 1.3 % (ref 0.0–3.0)
Eosinophils Absolute: 0.3 10*3/uL (ref 0.0–0.7)
Eosinophils Relative: 3.5 % (ref 0.0–5.0)
HCT: 39.5 % (ref 36.0–46.0)
Hemoglobin: 13.3 g/dL (ref 12.0–15.0)
Lymphocytes Relative: 37.4 % (ref 12.0–46.0)
Lymphs Abs: 3.2 10*3/uL (ref 0.7–4.0)
MCHC: 33.7 g/dL (ref 30.0–36.0)
MCV: 88.4 fl (ref 78.0–100.0)
Monocytes Absolute: 0.6 10*3/uL (ref 0.1–1.0)
Monocytes Relative: 6.7 % (ref 3.0–12.0)
Neutro Abs: 4.4 10*3/uL (ref 1.4–7.7)
Neutrophils Relative %: 51.1 % (ref 43.0–77.0)
Platelets: 325 10*3/uL (ref 150.0–400.0)
RBC: 4.47 Mil/uL (ref 3.87–5.11)
RDW: 14.2 % (ref 11.5–15.5)
WBC: 8.6 10*3/uL (ref 4.0–10.5)

## 2019-06-09 LAB — COMPREHENSIVE METABOLIC PANEL
ALT: 22 U/L (ref 0–35)
AST: 17 U/L (ref 0–37)
Albumin: 4.4 g/dL (ref 3.5–5.2)
Alkaline Phosphatase: 58 U/L (ref 39–117)
BUN: 9 mg/dL (ref 6–23)
CO2: 25 mEq/L (ref 19–32)
Calcium: 9.4 mg/dL (ref 8.4–10.5)
Chloride: 103 mEq/L (ref 96–112)
Creatinine, Ser: 0.62 mg/dL (ref 0.40–1.20)
GFR: 103.65 mL/min (ref >60.00)
Glucose, Bld: 92 mg/dL (ref 70–99)
Potassium: 3.9 mEq/L (ref 3.5–5.1)
Sodium: 137 mEq/L (ref 135–145)
Total Bilirubin: 0.4 mg/dL (ref 0.2–1.2)
Total Protein: 7.3 g/dL (ref 6.0–8.3)

## 2019-06-09 LAB — SEDIMENTATION RATE: Sed Rate: 60 mm/hr — ABNORMAL HIGH (ref 0–20)

## 2019-06-09 LAB — C-REACTIVE PROTEIN: CRP: 1.3 mg/dL (ref 0.5–20.0)

## 2019-06-09 MED ORDER — RIFAXIMIN 550 MG PO TABS: 550.0000 mg | ORAL_TABLET | Freq: Two times a day (BID) | ORAL | 0 refills | Status: DC
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
## 2019-06-09 NOTE — Patient Instructions (Addendum)
I am recommending labs and stool studies to evaluate your abdominal pain, nausea, and diarrhea.  I have also recommended an upper endoscopy and colonoscopy. We will call you back to schedule.   Continue to use your hyoscyamine four times daily.  Add a daily dose of Citrucel. Increase to twice daily if not improvement after 2 weeks.  Your diet may trigger your symptoms. Please try change in your diet to minimize your symptoms. Do only one diet change at time:      1. No carbonated beverages for 2 weeks. If no improvement, then      2. No artificial sweeteners for 2 weeks. If no improvement, then      3. Full lactose free trial: 3 weeks NO, milk, cheese, sour cream,ice cream, yogurt, creamer, baked goods (cookies/cakes/watch breads), chocolate (even dark chocolate), protein bars, dressing/condiments (watch). You won't live like this forever but for the trial need to be strict.

## 2019-06-22 LAB — HM DIABETES EYE EXAM

## 2019-06-27 ENCOUNTER — Ambulatory Visit: Payer: Managed Care, Other (non HMO) | Attending: Internal Medicine

## 2019-06-27 DIAGNOSIS — Z23 Encounter for immunization: Secondary | ICD-10-CM

## 2019-06-27 NOTE — Progress Notes (Signed)
   Covid-19 Vaccination [facility]  Name:  RUCHEL COMP    MRN: [id number] DOB: Feb 06, 1976  06/27/2019  Ms. Ruhland was observed post Covid-19 immunization for 15 minutes without incident. She was provided with Vaccine Information Sheet and instruction to access the V-Safe system.   Ms. Rassi was instructed to call 911 with any severe reactions post vaccine: Marland Kitchen Difficulty breathing  . Swelling of face and throat  . A fast heartbeat  . A bad rash all over body  . Dizziness and weakness   Immunizations Administered    Name Date Dose VIS Date Route   Pfizer COVID-19 Vaccine 06/27/2019 11:27 AM 0.3 mL 05/04/2018 Intramuscular   Manufacturer: Victoria Vera   Lot: [id number]   [place]: [id number]

## 2019-07-07 ENCOUNTER — Other Ambulatory Visit: Payer: Self-pay

## 2019-07-07 ENCOUNTER — Ambulatory Visit: Payer: Managed Care, Other (non HMO) | Admitting: Family Medicine

## 2019-07-07 ENCOUNTER — Encounter: Payer: Self-pay | Admitting: Internal Medicine

## 2019-07-07 ENCOUNTER — Encounter: Payer: Self-pay | Admitting: Family Medicine

## 2019-07-07 VITALS — BP 127/77 | HR 72 | Temp 97.6°F | Ht 69.0 in | Wt 339.0 lb

## 2019-07-07 DIAGNOSIS — E0842 Diabetes mellitus due to underlying condition with diabetic polyneuropathy: Secondary | ICD-10-CM

## 2019-07-07 DIAGNOSIS — G43709 Chronic migraine without aura, not intractable, without status migrainosus: Secondary | ICD-10-CM

## 2019-07-07 DIAGNOSIS — Z9989 Dependence on other enabling machines and devices: Secondary | ICD-10-CM | POA: Diagnosis not present

## 2019-07-07 DIAGNOSIS — IMO0002 Reserved for concepts with insufficient information to code with codable children: Secondary | ICD-10-CM

## 2019-07-07 DIAGNOSIS — G4733 Obstructive sleep apnea (adult) (pediatric): Secondary | ICD-10-CM

## 2019-07-07 MED ORDER — TOPIRAMATE 25 MG PO TABS: 50.0000 mg | ORAL_TABLET | Freq: Every day | ORAL | 3 refills | Status: DC

## 2019-07-07 MED ORDER — GABAPENTIN 300 MG PO CAPS: 600.0000 mg | ORAL_CAPSULE | Freq: Three times a day (TID) | ORAL | 11 refills | Status: DC

## 2019-07-07 NOTE — Progress Notes (Addendum)
PATIENT: Katie Harris DOB: 1976-01-09  REASON FOR VISIT: follow up HISTORY FROM: patient  Chief Complaint  Patient presents with  . Follow-up    cpap fu , rm 1. alone      HISTORY OF PRESENT ILLNESS: Today 07/07/19 Katie Harris is a 44 y.o. female here today for follow up of headaches and neuropathy. She continues topiramate. Dose decreased from 50 to '25mg'$  daily due to numbness and tingling of fingers but now feels it may have been more related to getting a COVID vaccine. She has resumed '50mg'$  dosing. Headaches are well managed. She may have 2-3 per month. .She continues gabapentin 300/600/900. She occasionally takes '300mg'$  at bedtime if neuropathy is bad. She continues CPAP nightly. BP has been normal. Last A1C was 5.3. She is now on Ozepmpic. She has lost 45 pounds. Last eye exam 2 weeks ago and needed new lenses.    HISTORY: (copied from my note on 01/06/2019)   Katie Harris is a 44 y.o. female here today for follow up of headaches and neuropathy pain. We started her back on topiramate in April. She has taken '50mg'$  at bedtime and feels that headaches have improved. She continues to have mild daily headaches. She reports bifrontal retro orbital headaches. Feels pressure behind her eyes. She has not had any migraines/migraious symptoms over the past 4-5 months. She reports neuropathy has been stable. She has had more pain today than normal. She has had some tingling of right ring finger. This is not limiting her activity. She reports taking gabapentin '300mg'$  in am, '600mg'$  at 3p and '900mg'$  at bedtime. '600mg'$  TID ordered with Dr Jaynee Eagles in 06/2017. She is working closely with her endocrinologist for elevated CBG. Readings are consistently around 200. She was diagnosed with IBS recently. Blood pressures are usually 130's/90's. Today's reading was 156/100 and 144/92 on recheck.  She is doing very well with CPAP therapy at home.  She reports nightly compliance.  Compliance report reviewed from  11/05/2018 through 01/04/2019.  She is using CPAP therapy nightly and for greater than 4 hours each night.  Average usage is 8 hours and 48 minutes.  AHI is 1.9 on 5 to 13 cm of water and an EPR of 3.  There is no significant leak.    REVIEW OF SYSTEMS: Out of a complete 14 system review of symptoms, the patient complains only of the following symptoms, headaches, neuropathy and all other reviewed systems are negative.  ALLERGIES: No Known Allergies  HOME MEDICATIONS: Outpatient Medications Prior to Visit  Medication Sig Dispense Refill  . Accu-Chek FastClix Lancets MISC Use to check blood sugar once a day 100 each 11  . Alpha-Lipoic Acid 600 MG CAPS Take 600 mg by mouth daily.    . Blood Glucose Monitoring Suppl (ACCU-CHEK GUIDE) w/Device KIT 1 kit by Does not apply route 2 (two) times daily. 1 kit 0  . cyclobenzaprine (FLEXERIL) 10 MG tablet at bedtime.   3  . DULoxetine (CYMBALTA) 60 MG capsule Take 60 mg by mouth daily.   6  . glucose blood (ACCU-CHEK GUIDE) test strip 1 each by Other route daily. Use as instructed 100 each 3  . hyoscyamine (ANASPAZ) 0.125 MG TBDP disintergrating tablet Place 0.125 mg under the tongue 4 (four) times daily -  before meals and at bedtime.     Marland Kitchen levothyroxine (SYNTHROID) 75 MCG tablet TAKE 1 TABLET(75 MCG) BY MOUTH DAILY BEFORE BREAKFAST. FOLLOW UP 45 tablet 4  . metFORMIN (GLUCOPHAGE)  850 MG tablet Take 1 tablet (850 mg total) by mouth 2 (two) times daily with a meal. 180 tablet 3  . metoprolol (LOPRESSOR) 50 MG tablet Take 50 mg by mouth 2 (two) times daily.     . Multiple Vitamins-Minerals (ALIVE MULTI-VITAMIN PO) Take by mouth.    . Semaglutide,0.25 or 0.'5MG'$ /DOS, (OZEMPIC, 0.25 OR 0.5 MG/DOSE,) 2 MG/1.5ML SOPN Inject 0.5 mg into the skin once a week. 2 pen 5  . VITAMIN D PO Take by mouth daily.    Marland Kitchen gabapentin (NEURONTIN) 300 MG capsule Take 2 capsules (600 mg total) by mouth 3 (three) times daily. 180 capsule 11  . rifaximin (XIFAXAN) 550 MG TABS  tablet Take 1 tablet (550 mg total) by mouth 2 (two) times daily. 20 tablet 0  . topiramate (TOPAMAX) 25 MG tablet Take 2 tablets (50 mg total) by mouth daily. (Patient taking differently: Take 25 mg by mouth daily. ) 180 tablet 3   No facility-administered medications prior to visit.    PAST MEDICAL HISTORY: Past Medical History:  Diagnosis Date  . Anxiety   . Arthritis    lower back  . Back pain   . 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 Cancer ([place])    basal cell removed from left hairline area  . Depression   . Diabetes mellitus without complication ([place])    recent A1C 6.5 no meds presently  . Factor 5 Leiden mutation, heterozygous ([place]) 04/21/2012   Lab 04/16/12 in view of positive Leiden in her father; no personal hx of thrombosis despite prior pregnancy & C-section 2007  . 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 HTN (hypertension), benign 04/21/2012  . Hypertension   . Hyperthyroidism    levels were abnormal in Dec- no treatment currently  . Hyperthyroidism 04/21/2012  . Infertility, female   . Leg edema   . PCOS (polycystic ovarian syndrome)   . Peripheral vascular disease ([facility])    neuropathy bilateral feet  . Prediabetes   . Sleep apnea     PAST SURGICAL HISTORY: Past Surgical History:  Procedure Laterality Date  . CESAREAN SECTION  2007  . DILATATION & CURETTAGE/HYSTEROSCOPY WITH MYOSURE N/A 10/17/2016   Procedure: DILATATION & CURETTAGE/HYSTEROSCOPY WITH MYOSURE POLYPECTOMY;  Surgeon: Paula Compton, MD;  Location: [facility];  Service: Gynecology;  Laterality: N/A;  myosure rep will be here confirmed on 10/06/16  . DILATION AND CURETTAGE OF UTERUS     polpectomy  . INTRAUTERINE DEVICE (IUD) INSERTION N/A 10/17/2016   Procedure: INTRAUTERINE DEVICE (IUD) INSERTION MIRENA;  Surgeon: Paula Compton, MD;  Location: [facility];  Service: Gynecology;  Laterality: N/A;  . TONSILLECTOMY      FAMILY HISTORY: Family History  Problem Relation Age of  Onset  . Hypertension Mother   . Hyperlipidemia Mother   . Thyroid disease Mother   . Depression Mother   . Anxiety disorder Mother   . Sleep apnea Mother   . Obesity Mother   . Kidney cancer Mother 41  . Heart disease Father   . Diabetes Father   . Hypertension Father   . Hyperlipidemia Father   . Obesity Father   . Diabetes Maternal Grandmother   . Hyperlipidemia Maternal Grandmother   . Hyperlipidemia Maternal Grandfather   . Hyperlipidemia Paternal Grandmother   . Diabetes Paternal Grandmother   . Cancer Paternal Grandmother   . Heart disease Paternal Grandfather     SOCIAL HISTORY: Social History  Socioeconomic History  . Marital status: Married    Spouse name: Paytin Ramakrishnan  . Number of children: 1  . Years of education: Not on file  . Highest education level: Some college, no degree  Occupational History  . Occupation: Secretary/administrator  Tobacco Use  . Smoking status: Never Smoker  . Smokeless tobacco: Never Used  Substance and Sexual Activity  . Alcohol use: No  . Drug use: No  . Sexual activity: Yes    Birth control/protection: None  Other Topics Concern  . Not on file  Social History Narrative   Lives at home with her husband, child and dog    Right handed   No caffeine      Social Determinants of Health   Financial Resource Strain:   . Difficulty of Paying Living Expenses:   Food Insecurity:   . Worried About Charity fundraiser in the Last Year:   . Arboriculturist in the Last Year:   Transportation Needs:   . Film/video editor (Medical):   Marland Kitchen Lack of Transportation (Non-Medical):   Physical Activity:   . Days of Exercise per Week:   . Minutes of Exercise per Session:   Stress:   . Feeling of Stress :   Social Connections:   . Frequency of Communication with Friends and Family:   . Frequency of Social Gatherings with Friends and Family:   . Attends Religious Services:   . Active Member of Clubs or Organizations:   . Attends Theatre manager Meetings:   Marland Kitchen Marital Status:   Intimate Partner Violence:   . Fear of Current or Ex-Partner:   . Emotionally Abused:   Marland Kitchen Physically Abused:   . Sexually Abused:       PHYSICAL EXAM  Vitals:   07/07/19 1429  BP: 127/77  Pulse: 72  Temp: 97.6 F (36.4 C)  Weight: (!) 339 lb (153.8 kg)  Height: '5\' 9"'$  (1.753 m)   Body mass index is 50.06 kg/m.  Generalized: Well developed, in no acute distress  Cardiology: normal rate and rhythm, no murmur noted Respiratory: clear to auscultation bilaterally  Neurological examination  Mentation: Alert oriented to time, place, history taking. Follows all commands speech and language fluent Cranial nerve II-XII: Pupils were equal round reactive to light. Extraocular movements were full, visual field were full on confrontational test. Facial sensation and strength were normal.  Motor: The motor testing reveals 5 over 5 strength of all 4 extremities. Good symmetric motor tone is noted throughout.  Sensory: Sensory testing is intact to soft touch on all 4 extremities. No evidence of extinction is noted. Pinprick testing normal of bilateral lower extremities  Coordination: Cerebellar testing reveals good finger-nose-finger and heel-to-shin bilaterally.  Gait and station: Gait is normal.  Reflexes: Deep tendon reflexes are symmetric and normal bilaterally.   DIAGNOSTIC DATA (LABS, IMAGING, TESTING) - I reviewed patient records, labs, notes, testing and imaging myself where available.  No flowsheet data found.   Lab Results  Component Value Date   WBC 8.6 06/09/2019   HGB 13.3 06/09/2019   HCT 39.5 06/09/2019   MCV 88.4 06/09/2019   PLT 325.0 06/09/2019      Component Value Date/Time   NA 137 06/09/2019 1502   NA 137 09/08/2017 1021   K 3.9 06/09/2019 1502   CL 103 06/09/2019 1502   CO2 25 06/09/2019 1502   GLUCOSE 92 06/09/2019 1502   BUN 9 06/09/2019 1502   BUN 13  09/08/2017 1021   CREATININE 0.62 06/09/2019 1502    CALCIUM 9.4 06/09/2019 1502   PROT 7.3 06/09/2019 1502   PROT 7.0 09/08/2017 1021   ALBUMIN 4.4 06/09/2019 1502   ALBUMIN 4.1 09/08/2017 1021   AST 17 06/09/2019 1502   ALT 22 06/09/2019 1502   ALKPHOS 58 06/09/2019 1502   BILITOT 0.4 06/09/2019 1502   BILITOT 0.4 09/08/2017 1021   GFRNONAA 93 09/08/2017 1021   GFRAA 107 09/08/2017 1021   Lab Results  Component Value Date   CHOL 209 (H) 04/21/2019   HDL 35.60 (L) 04/21/2019   LDLCALC 168 (H) 09/08/2017   LDLDIRECT 143.0 04/21/2019   TRIG 233.0 (H) 04/21/2019   CHOLHDL 6 04/21/2019   Lab Results  Component Value Date   HGBA1C 5.8 (A) 04/21/2019   Lab Results  Component Value Date   VITAMINB12 398 04/23/2017   Lab Results  Component Value Date   TSH 2.57 01/14/2019       ASSESSMENT AND PLAN 44 y.o. year old female  has a past medical history of Anxiety, Arthritis, Back pain, Blood dyscrasia, Cancer ([place]), Depression, Diabetes mellitus without complication ([place]), Factor 5 Leiden mutation, heterozygous ([place]) (04/21/2012), Headache(784.0), HTN (hypertension), benign (04/21/2012), Hypertension, Hyperthyroidism, Hyperthyroidism (04/21/2012), Infertility, female, Leg edema, PCOS (polycystic ovarian syndrome), Peripheral vascular disease ([place]), Prediabetes, and Sleep apnea. here with     ICD-10-CM   1. Chronic migraine  G43.709   2. OSA on CPAP  G47.33    Z99.89   3. Diabetic polyneuropathy associated with diabetes mellitus due to underlying condition ([place])  E08.42     We will continue topiramate '50mg'$  daily and gabapentin '300mg'$  in am, '600mg'$  at lunch and '900mg'$  at bedtime. She may take an extra dose ('300mg'$ ) of gabapentin at bedtime as needed. Continue CPAP nightly and greater than 4 hours each night. She was encouraged to focus on healthy lifestyle habits with well balanced diet and regular exercise. She will continue close follow up with PCP and endocrinology. Follow up with [name] in 1 year, sooner if needed.    No orders of the  defined types were placed in this encounter.    Meds ordered this encounter  Medications  . topiramate (TOPAMAX) 25 MG tablet    Sig: Take 2 tablets (50 mg total) by mouth daily.    Dispense:  180 tablet    Refill:  3    Order Specific Question:   Supervising Provider    Answer:   Melvenia Beam [id number]  . gabapentin (NEURONTIN) 300 MG capsule    Sig: Take 2 capsules (600 mg total) by mouth 3 (three) times daily.    Dispense:  180 capsule    Refill:  11    Pt needs to call [phone] to schedule f/u for any future refills    Order Specific Question:   Supervising Provider    Answer:   Melvenia Beam [[id number]]      I spent 25 minutes with the patient. 50% of this time was spent counseling and educating patient on plan of care and medications.    Debbora Presto, FNP-C 07/07/2019, 2:57 PM [facility] [address] [place], [place] [address] [phone]  Made any corrections needed, and agree with history, physical, neuro exam,assessment and plan as stated.     Sarina Ill, MD [facility]

## 2019-07-07 NOTE — Patient Instructions (Addendum)
We will continue topiramate 50mg  daily, gabapentin 300 in am, 600 at noon and 900 in the pm. May take extra 300mg  dose as needed but avoid this on regular basis if possible. Call with any worsening.   Continue CPAP nightly   Follow up in 1 year, sooner if needed   Diabetic Neuropathy Diabetic neuropathy refers to nerve damage that is caused by diabetes (diabetes mellitus). Over time, people with diabetes can develop nerve damage throughout the body. There are several types of diabetic neuropathy:  Peripheral neuropathy. This is the most common type of diabetic neuropathy. It causes damage to nerves that carry signals between the spinal cord and other parts of the body (peripheral nerves). This usually affects nerves in the feet and legs first, and may eventually affect the hands and arms. The damage affects the ability to sense touch or temperature.  Autonomic neuropathy. This type causes damage to nerves that control involuntary functions (autonomic nerves). These nerves carry signals that control: ? Heartbeat. ? Body temperature. ? Blood pressure. ? Urination. ? Digestion. ? Sweating. ? Sexual function. ? Response to changing blood sugar (glucose) levels.  Focal neuropathy. This type of nerve damage affects one area of the body, such as an arm, a leg, or the face. The injury may involve one nerve or a small group of nerves. Focal neuropathy can be painful and unpredictable, and occurs most often in older adults with diabetes. This often develops suddenly, but usually improves over time and does not cause long-term problems.  Proximal neuropathy. This type of nerve damage affects the nerves of the thighs, hips, buttocks, or legs. It causes severe pain, weakness, and muscle death (atrophy), usually in the thigh muscles. It is more common among older men and people who have type 2 diabetes. The length of recovery time may vary. What are the causes? Peripheral, autonomic, and focal  neuropathies are caused by diabetes that is not well controlled with treatment. The cause of proximal neuropathy is not known, but it may be caused by inflammation related to uncontrolled blood glucose levels. What are the signs or symptoms? Peripheral neuropathy Peripheral neuropathy develops slowly over time. When the nerves of the feet and legs no longer work, you may experience:  Burning, stabbing, or aching pain in the legs or feet.  Pain or cramping in the legs or feet.  Loss of feeling (numbness) and inability to feel pressure or pain in the feet. This can lead to: ? Thick calluses or sores on areas of constant pressure. ? Ulcers. ? Reduced ability to feel temperature changes.  Foot deformities.  Muscle weakness.  Loss of balance or coordination. Autonomic neuropathy The symptoms of autonomic neuropathy vary depending on which nerves are affected. Symptoms may include:  Problems with digestion, such as: ? Nausea or vomiting. ? Poor appetite. ? Bloating. ? Diarrhea or constipation. ? Trouble swallowing. ? Losing weight without trying to.  Problems with the heart, blood and lungs, such as: ? Dizziness, especially when standing up. ? Fainting. ? Shortness of breath. ? Irregular heartbeat.  Bladder problems, such as: ? Trouble starting or stopping urination. ? Leaking urine. ? Trouble emptying the bladder. ? Urinary tract infections (UTIs).  Problems with other body functions, such as: ? Sweat. You may sweat too much or too little. ? Temperature. You might get hot easily. Or, you might feel cold more than usual. ? Sexual function. Men may not be able to get or maintain an erection. Women may have vaginal  dryness and difficulty with arousal. Focal neuropathy Symptoms affect only one area of the body. Common symptoms include:  Numbness.  Tingling.  Burning pain.  Prickling feeling.  Very sensitive skin.  Weakness.  Inability to move (paralysis).  Muscle  twitching.  Muscles getting smaller (wasting).  Poor coordination.  Double or blurred vision. Proximal neuropathy  Sudden, severe pain in the hip, thigh, or buttocks. Pain may spread from the back into the legs (sciatica).  Pain and numbness in the arms and legs.  Tingling.  Loss of bladder control or bowel control.  Weakness and wasting of thigh muscles.  Difficulty getting up from a seated position.  Abdominal swelling.  Unexplained weight loss. How is this diagnosed? Diagnosis usually involves reviewing your medical history and any symptoms you have. Diagnosis varies depending on the type of neuropathy your health care provider suspects. Peripheral neuropathy Your health care provider will check areas that are affected by your nervous system (neurologic exam), such as your reflexes, how you move, and what you can feel. You may have other tests, such as:  Blood tests.  Removal and examination of fluid that surrounds the spinal cord (lumbar puncture).  CT scan.  MRI.  A test to check the nerves that control muscles (electromyogram, EMG).  Tests of how quickly messages pass through your nerves (nerve conduction velocity tests).  Removal of a small piece of nerve to be examined under a microscope (biopsy). Autonomic neuropathy You may have tests, such as:  Tests to measure your blood pressure and heart rate. This may include monitoring you while you are safely secured to an exam table that moves you from a lying position to an upright position (table tilt test).  Breathing tests to check your lungs.  Tests to check how food moves through the digestive system (gastric emptying tests).  Blood, sweat, or urine tests.  Ultrasound of your bladder.  Spinal fluid tests. Focal neuropathy This condition may be diagnosed with:  A neurologic exam.  CT scan.  MRI.  EMG.  Nerve conduction velocity tests. Proximal neuropathy There is no test to diagnose this type  of neuropathy. You may have tests to rule out other possible causes of this type of neuropathy. Tests may include:  X-rays of your spine and lumbar region.  Lumbar puncture.  MRI. How is this treated? The goal of treatment is to keep nerve damage from getting worse. The most important part of treatment is keeping your blood glucose level and your A1C level within your target range by following your diabetes management plan. Over time, maintaining lower blood glucose levels helps lessen symptoms. In some cases, you may need prescription pain medicine. Follow these instructions at home:  Lifestyle   Do not use any products that contain nicotine or tobacco, such as cigarettes and e-cigarettes. If you need help quitting, ask your health care provider.  Be physically active every day. Include strength training and balance exercises.  Follow a healthy meal plan.  Work with your health care provider to manage your blood pressure. General instructions  Follow your diabetes management plan as directed. ? Check your blood glucose levels as directed by your health care provider. ? Keep your blood glucose in your target range as directed by your health care provider. ? Have your A1C level checked at least two times a year, or as often as told by your health care provider.  Take over the counter and prescription medicines only as told by your health care provider. This  includes insulin and diabetes medicine.  Do not drive or use heavy machinery while taking prescription pain medicines.  Check your skin and feet every day for cuts, bruises, redness, blisters, or sores.  Keep all follow up visits as told by your health care provider. This is important. Contact a health care provider if:  You have burning, stabbing, or aching pain in your legs or feet.  You are unable to feel pressure or pain in your feet.  You develop problems with digestion, such  as: ? Nausea. ? Vomiting. ? Bloating. ? Constipation. ? Diarrhea. ? Abdominal pain.  You have difficulty with urination, such as inability: ? To control when you urinate (incontinence). ? To completely empty the bladder (retention).  You have palpitations.  You feel dizzy, weak, or faint when you stand up. Get help right away if:  You cannot urinate.  You have sudden weakness or loss of coordination.  You have trouble speaking.  You have pain or pressure in your chest.  You have an irregular heart beat.  You have sudden inability to move a part of your body. Summary  Diabetic neuropathy refers to nerve damage that is caused by diabetes. It can affect nerves throughout the entire body, causing numbness and pain in the arms, legs, digestive tract, heart, and other body systems.  Keep your blood glucose level and your blood pressure in your target range, as directed by your health care provider. This can help prevent neuropathy from getting worse.  Check your skin and feet every day for cuts, bruises, redness, blisters, or sores.  Do not use any products that contain nicotine or tobacco, such as cigarettes and e-cigarettes. If you need help quitting, ask your health care provider. This information is not intended to replace advice given to you by your health care provider. Make sure you discuss any questions you have with your health care provider. Document Revised: 04/08/2017 Document Reviewed: 03/31/2016 Elsevier Patient Education  [place].   Migraine Headache A migraine headache is a very strong throbbing pain on one side or both sides of your head. This type of headache can also cause other symptoms. It can last from 4 hours to 3 days. Talk with your doctor about what things may bring on (trigger) this condition. What are the causes? The exact cause of this condition is not known. This condition may be triggered or caused by:  Drinking  alcohol.  Smoking.  Taking medicines, such as: ? Medicine used to treat chest pain (nitroglycerin). ? Birth control pills. ? Estrogen. ? Some blood pressure medicines.  Eating or drinking certain products.  Doing physical activity. Other things that may trigger a migraine headache include:  Having a menstrual period.  Pregnancy.  Hunger.  Stress.  Not getting enough sleep or getting too much sleep.  Weather changes.  Tiredness (fatigue). What increases the risk?  Being 43-56 years old.  Being female.  Having a family history of migraine headaches.  Being Caucasian.  Having depression or anxiety.  Being very overweight. What are the signs or symptoms?  A throbbing pain. This pain may: ? Happen in any area of the head, such as on one side or both sides. ? Make it hard to do daily activities. ? Get worse with physical activity. ? Get worse around bright lights or loud noises.  Other symptoms may include: ? Feeling sick to your stomach (nauseous). ? Vomiting. ? Dizziness. ? Being sensitive to bright lights, loud noises, or smells.  Before you get a migraine headache, you may get warning signs (an aura). An aura may include: ? Seeing flashing lights or having blind spots. ? Seeing bright spots, halos, or zigzag lines. ? Having tunnel vision or blurred vision. ? Having numbness or a tingling feeling. ? Having trouble talking. ? Having weak muscles.  Some people have symptoms after a migraine headache (postdromal phase), such as: ? Tiredness. ? Trouble thinking (concentrating). How is this treated?  Taking medicines that: ? Relieve pain. ? Relieve the feeling of being sick to your stomach. ? Prevent migraine headaches.  Treatment may also include: ? Having acupuncture. ? Avoiding foods that bring on migraine headaches. ? Learning ways to control your body functions (biofeedback). ? Therapy to help you know and deal with negative thoughts  (cognitive behavioral therapy). Follow these instructions at home: Medicines  Take over-the-counter and prescription medicines only as told by your doctor.  Ask your doctor if the medicine prescribed to you: ? Requires you to avoid driving or using heavy machinery. ? Can cause trouble pooping (constipation). You may need to take these steps to prevent or treat trouble pooping:  Drink enough fluid to keep your pee (urine) pale yellow.  Take over-the-counter or prescription medicines.  Eat foods that are high in fiber. These include beans, whole grains, and fresh fruits and vegetables.  Limit foods that are high in fat and sugar. These include fried or sweet foods. Lifestyle  Do not drink alcohol.  Do not use any products that contain nicotine or tobacco, such as cigarettes, e-cigarettes, and chewing tobacco. If you need help quitting, ask your doctor.  Get at least 8 hours of sleep every night.  Limit and deal with stress. General instructions      Keep a journal to find out what may bring on your migraine headaches. For example, write down: ? What you eat and drink. ? How much sleep you get. ? Any change in what you eat or drink. ? Any change in your medicines.  If you have a migraine headache: ? Avoid things that make your symptoms worse, such as bright lights. ? It may help to lie down in a dark, quiet room. ? Do not drive or use heavy machinery. ? Ask your doctor what activities are safe for you.  Keep all follow-up visits as told by your doctor. This is important. Contact a doctor if:  You get a migraine headache that is different or worse than others you have had.  You have more than 15 headache days in one month. Get help right away if:  Your migraine headache gets very bad.  Your migraine headache lasts longer than 72 hours.  You have a fever.  You have a stiff neck.  You have trouble seeing.  Your muscles feel weak or like you cannot control  them.  You start to lose your balance a lot.  You start to have trouble walking.  You pass out (faint).  You have a seizure. Summary  A migraine headache is a very strong throbbing pain on one side or both sides of your head. These headaches can also cause other symptoms.  This condition may be treated with medicines and changes to your lifestyle.  Keep a journal to find out what may bring on your migraine headaches.  Contact a doctor if you get a migraine headache that is different or worse than others you have had.  Contact your doctor if you have more than 15 headache days  in a month. This information is not intended to replace advice given to you by your health care provider. Make sure you discuss any questions you have with your health care provider. Document Revised: 06/18/2018 Document Reviewed: 04/08/2018 Elsevier Patient Education  [place].   Sleep Apnea Sleep apnea affects breathing during sleep. It causes breathing to stop for a short time or to become shallow. It can also increase the risk of:  Heart attack.  Stroke.  Being very overweight (obese).  Diabetes.  Heart failure.  Irregular heartbeat. The goal of treatment is to help you breathe normally again. What are the causes? There are three kinds of sleep apnea:  Obstructive sleep apnea. This is caused by a blocked or collapsed airway.  Central sleep apnea. This happens when the brain does not send the right signals to the muscles that control breathing.  Mixed sleep apnea. This is a combination of obstructive and central sleep apnea. The most common cause of this condition is a collapsed or blocked airway. This can happen if:  Your throat muscles are too relaxed.  Your tongue and tonsils are too large.  You are overweight.  Your airway is too small. What increases the risk?  Being overweight.  Smoking.  Having a small airway.  Being older.  Being female.  Drinking  alcohol.  Taking medicines to calm yourself (sedatives or tranquilizers).  Having family members with the condition. What are the signs or symptoms?  Trouble staying asleep.  Being sleepy or tired during the day.  Getting angry a lot.  Loud snoring.  Headaches in the morning.  Not being able to focus your mind (concentrate).  Forgetting things.  Less interest in sex.  Mood swings.  Personality changes.  Feelings of sadness (depression).  Waking up a lot during the night to pee (urinate).  Dry mouth.  Sore throat. How is this diagnosed?  Your medical history.  A physical exam.  A test that is done when you are sleeping (sleep study). The test is most often done in a sleep lab but may also be done at home. How is this treated?   Sleeping on your side.  Using a medicine to get rid of mucus in your nose (decongestant).  Avoiding the use of alcohol, medicines to help you relax, or certain pain medicines (narcotics).  Losing weight, if needed.  Changing your diet.  Not smoking.  Using a machine to open your airway while you sleep, such as: ? An oral appliance. This is a mouthpiece that shifts your lower jaw forward. ? A CPAP device. This device blows air through a mask when you breathe out (exhale). ? An EPAP device. This has valves that you put in each nostril. ? A BPAP device. This device blows air through a mask when you breathe in (inhale) and breathe out.  Having surgery if other treatments do not work. It is important to get treatment for sleep apnea. Without treatment, it can lead to:  High blood pressure.  Coronary artery disease.  In men, not being able to have an erection (impotence).  Reduced thinking ability. Follow these instructions at home: Lifestyle  Make changes that your doctor recommends.  Eat a healthy diet.  Lose weight if needed.  Avoid alcohol, medicines to help you relax, and some pain medicines.  Do not use any  products that contain nicotine or tobacco, such as cigarettes, e-cigarettes, and chewing tobacco. If you need help quitting, ask your doctor. General instructions  Take over-the-counter and prescription medicines only as told by your doctor.  If you were given a machine to use while you sleep, use it only as told by your doctor.  If you are having surgery, make sure to tell your doctor you have sleep apnea. You may need to bring your device with you.  Keep all follow-up visits as told by your doctor. This is important. Contact a doctor if:  The machine that you were given to use during sleep bothers you or does not seem to be working.  You do not get better.  You get worse. Get help right away if:  Your chest hurts.  You have trouble breathing in enough air.  You have an uncomfortable feeling in your back, arms, or stomach.  You have trouble talking.  One side of your body feels weak.  A part of your face is hanging down. These symptoms may be an emergency. Do not wait to see if the symptoms will go away. Get medical help right away. Call your local emergency services (911 in the [place] Do not drive yourself to the hospital. Summary  This condition affects breathing during sleep.  The most common cause is a collapsed or blocked airway.  The goal of treatment is to help you breathe normally while you sleep. This information is not intended to replace advice given to you by your health care provider. Make sure you discuss any questions you have with your health care provider. Document Revised: 12/11/2017 Document Reviewed: 10/20/2017 Elsevier Patient Education  [place].

## 2019-09-05 ENCOUNTER — Other Ambulatory Visit: Payer: Self-pay | Admitting: Internal Medicine

## 2019-10-24 ENCOUNTER — Ambulatory Visit: Payer: Managed Care, Other (non HMO) | Admitting: Internal Medicine

## 2019-11-21 ENCOUNTER — Other Ambulatory Visit: Payer: Self-pay

## 2019-11-21 ENCOUNTER — Other Ambulatory Visit (INDEPENDENT_AMBULATORY_CARE_PROVIDER_SITE_OTHER): Payer: Managed Care, Other (non HMO)

## 2019-11-21 ENCOUNTER — Encounter: Payer: Self-pay | Admitting: Internal Medicine

## 2019-11-21 ENCOUNTER — Ambulatory Visit: Payer: Managed Care, Other (non HMO) | Admitting: Internal Medicine

## 2019-11-21 VITALS — BP 120/70 | HR 87 | Ht 68.0 in | Wt 331.0 lb

## 2019-11-21 DIAGNOSIS — Z6841 Body Mass Index (BMI) 40.0 and over, adult: Secondary | ICD-10-CM

## 2019-11-21 DIAGNOSIS — E890 Postprocedural hypothyroidism: Secondary | ICD-10-CM | POA: Diagnosis not present

## 2019-11-21 DIAGNOSIS — E119 Type 2 diabetes mellitus without complications: Secondary | ICD-10-CM | POA: Diagnosis not present

## 2019-11-21 LAB — POCT GLYCOSYLATED HEMOGLOBIN (HGB A1C): Hemoglobin A1C: 5.5 % (ref 4.0–5.6)

## 2019-11-21 LAB — TSH: TSH: 1.96 u[IU]/mL (ref 0.35–4.50)

## 2019-11-21 LAB — T4, FREE: Free T4: 0.68 ng/dL (ref 0.60–1.60)

## 2019-11-21 MED ORDER — OZEMPIC (0.25 OR 0.5 MG/DOSE) 2 MG/1.5ML ~~LOC~~ SOPN
0.5000 mg | PEN_INJECTOR | SUBCUTANEOUS | 3 refills | Status: DC
Start: 2019-11-21 — End: 2020-11-21

## 2019-11-21 MED ORDER — METFORMIN HCL 850 MG PO TABS
850.0000 mg | ORAL_TABLET | Freq: Two times a day (BID) | ORAL | 3 refills | Status: DC
Start: 2019-11-21 — End: 2020-12-18

## 2019-11-21 MED ORDER — LEVOTHYROXINE SODIUM 75 MCG PO TABS
ORAL_TABLET | ORAL | 3 refills | Status: DC
Start: 2019-11-21 — End: 2020-11-21

## 2019-11-21 NOTE — Progress Notes (Signed)
Patient ID: Katie Harris, female   DOB: August 12, 1975, 44 y.o.   MRN: [id number]  This visit occurred during the SARS-CoV-2 public health emergency.  Safety protocols were in place, including screening questions prior to the visit, additional usage of staff PPE, and extensive cleaning of exam room while observing appropriate contact time as indicated for disinfecting solutions.   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 Last visit 6 months ago.  Reviewed history: Pt was diagnosed with thyrotoxicosis in 11/2014. At that time, she was taking several thyrotoxic supplements and also took Biotin.  Before our first visit, she was on biotin, B6 vitamin, [place], but was previously on [place], [employer] - stopped 3-4 weeks prior to the appt. I advised her to stay off all the supplements and hold the Biotin few days before next TFTs.  Her TSI antibodies were not elevated.   TFTs continued to fluctuate but she continued to have suppressed TSH and elevated free T3.  Thyroid Uptake and scan which she had on 10/25/2015: Elevated 24 hour radio iodine uptake of 42%.  Large hot nodule within LEFT thyroid lobe with suppression of uptake within the RIGHT lobe consistent with a toxic adenoma.   11/09/2015: RAI treatment  She developed post ablative hypothyroidism afterwards.  We started on levothyroxine 50 mcg daily and increase the dose to 75 mcg daily 05/2018.  She takes this: - in am - fasting - at least 30 min from b'fast - no Ca, Fe, PPIs - + MVIs in the pm - not on Biotin  Reviewed her TFTs: Lab Results  Component Value Date   TSH 2.57 01/14/2019   TSH 3.71 05/10/2018   TSH 3.35 12/22/2017   TSH 6.830 (H) 09/08/2017   TSH 4.780 (H) 04/23/2017   TSH 4.260 01/06/2017   TSH 2.78 07/29/2016   TSH 2.37  03/31/2016   TSH 0.11 (L) 12/21/2015   TSH 0.03 (L) 10/15/2015   FREET4 1.0 01/14/2019   FREET4 0.95 05/10/2018   FREET4 0.84 12/22/2017   FREET4 0.90 09/08/2017   FREET4 0.95 04/23/2017   FREET4 0.72 07/29/2016   FREET4 0.55 (L) 03/31/2016   FREET4 0.89 12/21/2015   FREET4 1.60 10/15/2015   FREET4 1.32 08/31/2015   T3FREE 3.3 07/29/2016   T3FREE 2.7 03/31/2016   T3FREE 3.8 12/21/2015   T3FREE 4.5 (H) 10/15/2015   T3FREE 5.0 (H) 08/31/2015   T3FREE 5.2 (H) 07/19/2015  11/07/2014: TSH <0.01, fT4 1.54 (0.61-1.18)  TSI's were not elevated: Lab Results  Component Value Date   TSI <89 07/19/2015   Pt denies: - feeling nodules in neck - hoarseness - dysphagia - choking - SOB with lying down  Pt does have a FH of thyroid ds: hypothyroidism in mother, possibly MGM. No FH of thyroid cancer. No h/o radiation tx to head or neck other than RAI treatment.  No herbal supplements. No Biotin use. No recent steroids use.   She also has a history of PCOS and pseudotumor cerebri.  DM2: - dx'ed 2019  She is currently on: - Metformin 500 mg >> 850 mg 2x a day   - Ozempic 0.5 mg weekly - started 01/2019 - had mild nausea >> resolved  She checks sugars once a day: - am:181-200 >> 136, 146 >> 102-132 - 2h after b'fast: 159-245 >> 142 >> 109, 111 - lunch: 138,  197-291 >> 116 >> n/c - 2h after lunch: 174-219 >> 127, 136, 167 >> 107-124 - dinner: 165-246 >> 120 >> 95-121 - 2h after dinner: 204 >> n/c - bedtime: 182 >> n/c  Reviewed HbA1c levels: Lab Results  Component Value Date   HGBA1C 5.8 (A) 04/21/2019   HGBA1C 7.9 (A) 01/14/2019   HGBA1C 6.6 (A) 05/10/2018   HGBA1C 5.9 (H) 09/08/2017   HGBA1C 7.4 (H) 04/23/2017   Normal kidney function: Lab Results  Component Value Date   BUN 9 06/09/2019   Lab Results  Component Value Date   CREATININE 0.62 06/09/2019   CREATININE 0.70 04/21/2019   CREATININE 0.78 09/08/2017   CREATININE 0.71 04/23/2017   CREATININE 0.68  01/06/2017   CREATININE 0.62 10/09/2016   CREATININE 0.65 07/09/2016   CREATININE 0.62 03/22/2012   + HL: Lab Results  Component Value Date   CHOL 209 (H) 04/21/2019   HDL 35.60 (L) 04/21/2019   LDLCALC 168 (H) 09/08/2017   LDLDIRECT 143.0 04/21/2019   TRIG 233.0 (H) 04/21/2019   CHOLHDL 6 04/21/2019   Last eye exam was in 06/22/2019: No DR  ROS: Constitutional: no weight gain/+ weight loss, no fatigue, no subjective hyperthermia, no subjective hypothermia Eyes: no blurry vision, no xerophthalmia ENT: no sore throat, + see HPI Cardiovascular: no CP/no SOB/no palpitations/no leg swelling Respiratory: no cough/no SOB/no wheezing Gastrointestinal: no N/no V/no D/no C/no acid reflux Musculoskeletal: no muscle aches/no joint aches Skin: no rashes, no hair loss Neurological: no tremors/no numbness/no tingling/no dizziness  I reviewed pt's medications, allergies, PMH, social hx, family hx, and changes were documented in the history of present illness. Otherwise, unchanged from my initial visit note.  Past Medical History:  Diagnosis Date  . Anxiety   . Arthritis    lower back  . Back pain   . 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 Cancer ([place])    basal cell removed from left hairline area  . Depression   . Diabetes mellitus without complication ([place])    recent A1C 6.5 no meds presently  . Factor 5 Leiden mutation, heterozygous ([place] [facility]) 04/21/2012   Lab 04/16/12 in view of positive Leiden in her father; no personal hx of thrombosis despite prior pregnancy & C-section 2007  . 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 HTN (hypertension), benign 04/21/2012  . Hypertension   . Hyperthyroidism    levels were abnormal in Dec- no treatment currently  . Hyperthyroidism 04/21/2012  . Infertility, female   . Leg edema   . PCOS (polycystic ovarian syndrome)   . Peripheral vascular disease ([facility])    neuropathy bilateral feet  . Prediabetes   . Sleep apnea    Past  Surgical History:  Procedure Laterality Date  . CESAREAN SECTION  2007  . DILATATION & CURETTAGE/HYSTEROSCOPY WITH MYOSURE N/A 10/17/2016   Procedure: DILATATION & CURETTAGE/HYSTEROSCOPY WITH MYOSURE POLYPECTOMY;  Surgeon: Paula Compton, MD;  Location: [facility];  Service: Gynecology;  Laterality: N/A;  myosure rep will be here confirmed on 10/06/16  . DILATION AND CURETTAGE OF UTERUS     polpectomy  . INTRAUTERINE DEVICE (IUD) INSERTION N/A 10/17/2016   Procedure: INTRAUTERINE DEVICE (IUD) INSERTION MIRENA;  Surgeon: Paula Compton, MD;  Location: [facility];  Service: Gynecology;  Laterality: N/A;  . TONSILLECTOMY     Social History   Social History  . Marital Status: Married    Spouse Name: N/A  . Number of Children: 1  Occupational History  . Project coordinator   Social History Main Topics  . Smoking status: Never Smoker   . Smokeless tobacco: Not on file  . Alcohol Use: No  . Drug Use: No   Current Outpatient Medications on File Prior to Visit  Medication Sig Dispense Refill  . Accu-Chek FastClix Lancets MISC Use to check blood sugar once a day 100 each 11  . Alpha-Lipoic Acid 600 MG CAPS Take 600 mg by mouth daily.    . Blood Glucose Monitoring Suppl (ACCU-CHEK GUIDE) w/Device KIT 1 kit by Does not apply route 2 (two) times daily. 1 kit 0  . cyclobenzaprine (FLEXERIL) 10 MG tablet at bedtime.   3  . DULoxetine (CYMBALTA) 60 MG capsule Take 60 mg by mouth daily.   6  . gabapentin (NEURONTIN) 300 MG capsule Take 2 capsules (600 mg total) by mouth 3 (three) times daily. 180 capsule 11  . glucose blood (ACCU-CHEK GUIDE) test strip 1 each by Other route daily. Use as instructed 100 each 3  . hyoscyamine (ANASPAZ) 0.125 MG TBDP disintergrating tablet Place 0.125 mg under the tongue 4 (four) times daily -  before meals and at bedtime.     Marland Kitchen levothyroxine (SYNTHROID) 75 MCG tablet TAKE 1 TABLET(75 MCG) BY MOUTH DAILY BEFORE BREAKFAST. FOLLOW UP 45 tablet 1  . metFORMIN  (GLUCOPHAGE) 850 MG tablet Take 1 tablet (850 mg total) by mouth 2 (two) times daily with a meal. 180 tablet 3  . metoprolol (LOPRESSOR) 50 MG tablet Take 50 mg by mouth 2 (two) times daily.     . Multiple Vitamins-Minerals (ALIVE MULTI-VITAMIN PO) Take by mouth.    . Semaglutide,0.25 or 0.5MG /DOS, (OZEMPIC, 0.25 OR 0.5 MG/DOSE,) 2 MG/1.5ML SOPN Inject 0.5 mg into the skin once a week. 2 pen 5  . topiramate (TOPAMAX) 25 MG tablet Take 2 tablets (50 mg total) by mouth daily. 180 tablet 3  . VITAMIN D PO Take by mouth daily.     No current facility-administered medications on file prior to visit.   No Known Allergies Family History  Problem Relation Age of Onset  . Hypertension Mother   . Hyperlipidemia Mother   . Thyroid disease Mother   . Depression Mother   . Anxiety disorder Mother   . Sleep apnea Mother   . Obesity Mother   . Kidney cancer Mother 85  . Heart disease Father   . Diabetes Father   . Hypertension Father   . Hyperlipidemia Father   . Obesity Father   . Diabetes Maternal Grandmother   . Hyperlipidemia Maternal Grandmother   . Hyperlipidemia Maternal Grandfather   . Hyperlipidemia Paternal Grandmother   . Diabetes Paternal Grandmother   . Cancer Paternal Grandmother   . Heart disease Paternal Grandfather     PE: BP 120/70   Pulse 87   Ht 5\' 8"  (1.727 m)   Wt (!) 331 lb (150.1 kg)   SpO2 99%   BMI 50.33 kg/m  Wt Readings from Last 3 Encounters:  11/21/19 (!) 331 lb (150.1 kg)  07/07/19 (!) 339 lb (153.8 kg)  06/09/19 (!) 339 lb (153.8 kg)   Constitutional: overweight, in NAD Eyes: PERRLA, EOMI, no exophthalmos ENT: moist mucous membranes, no thyromegaly, no cervical lymphadenopathy Cardiovascular: RRR, No MRG Respiratory: CTA B Gastrointestinal: abdomen soft, NT, ND, BS+ Musculoskeletal: no deformities, strength intact in all 4 Skin: moist, warm, no rashes Neurological: no tremor with outstretched hands, DTR normal in all 4  ASSESSMENT: 1.  Post  ablative hypothyroidism -After RAI treatment for toxic adenoma  2. DM2, non-insulin-dependent,  with complications -Peripheral neuropathy  3.  Obesity class III  PLAN:  1. Patient with history of thyrotoxicosis with a diagnosis of left toxic adenoma, now status post RAI treatment in 11/2015, after which she developed post ablative hypothyroidism - latest thyroid labs reviewed with pt >> normal: Lab Results  Component Value Date   TSH 2.57 01/14/2019   - she continues on LT4 75 mcg daily - pt feels good on this dose. - we discussed about taking the thyroid hormone every day, with water, >30 minutes before breakfast, separated by >4 hours from acid reflux medications, calcium, iron, multivitamins. Pt. is taking it correctly. - will check thyroid tests today: TSH and fT4 - If labs are abnormal, she will need to return for repeat TFTs in 1.5 months  3. DM2 -She continues on Metformin at submaximal dose -Her sugars improved significantly after adding Ozempic.  She had occasional nausea with it but wanted to continue it.  At last visit, HbA1c decreased to 5.8%, at goal -At this visit, we downloaded her meter and it appears that almost all of the values recorded are at goal.  She had to slightly higher blood sugars in the morning, at 132, but overall, her sugars are exemplary.  We will continue the current regimen.  Her nausea with Ozempic resolved. -I advised her to: Patient Instructions  Please continue: - Metformin 850 mg 2x a day - Ozempic 0.5 mg weekly  Please return in 6 months with your sugar log.   - we checked her HbA1c: 5.5% (excellent) - advised to check sugars at different times of the day - 1x a day, rotating check times - advised for yearly eye exams >> she is UTD - return to clinic in 6 months  3.  Obesity class III -!She lost 23 pounds before last visit! - wt at last visit: 346 lbs >> now: 331 lbs (lost 15 lbs!!!) - I congratulated her. -Continues Ozempic, which  should also help with weight loss -She is also on Topamax  - for HAs  Needs refills - LT4, Metformin, Ozempic.  Component     Latest Ref Rng & Units 11/21/2019  T4,Free(Direct)     0.60 - 1.60 ng/dL 0.68  TSH     0.35 - 4.50 uIU/mL 1.96    Philemon Kingdom, MD PhD [facility] Endocrinology

## 2019-11-21 NOTE — Patient Instructions (Signed)
Please continue: - Metformin 850 mg 2x a day - Ozempic 0.5 mg weekly  Please contiue Levothyroxine 75 mcg daily.  Take the thyroid hormone every day, with water, at least 30 minutes before breakfast, separated by at least 4 hours from: - acid reflux medications - calcium - iron - multivitamins  Please stop at the lab.  Please return in 6 months with your sugar log.

## 2020-05-22 ENCOUNTER — Ambulatory Visit: Payer: Managed Care, Other (non HMO) | Admitting: Internal Medicine

## 2020-06-01 ENCOUNTER — Other Ambulatory Visit: Payer: Self-pay | Admitting: Internal Medicine

## 2020-06-01 DIAGNOSIS — E119 Type 2 diabetes mellitus without complications: Secondary | ICD-10-CM

## 2020-06-08 ENCOUNTER — Other Ambulatory Visit: Payer: Self-pay

## 2020-06-08 ENCOUNTER — Encounter: Payer: Self-pay | Admitting: Internal Medicine

## 2020-06-08 ENCOUNTER — Ambulatory Visit: Payer: Managed Care, Other (non HMO) | Admitting: Internal Medicine

## 2020-06-08 VITALS — BP 128/82 | HR 81 | Ht 68.0 in | Wt 337.4 lb

## 2020-06-08 DIAGNOSIS — E119 Type 2 diabetes mellitus without complications: Secondary | ICD-10-CM | POA: Diagnosis not present

## 2020-06-08 DIAGNOSIS — Z6841 Body Mass Index (BMI) 40.0 and over, adult: Secondary | ICD-10-CM

## 2020-06-08 DIAGNOSIS — E890 Postprocedural hypothyroidism: Secondary | ICD-10-CM | POA: Diagnosis not present

## 2020-06-08 LAB — TSH: TSH: 1.5 u[IU]/mL (ref 0.35–4.50)

## 2020-06-08 LAB — POCT GLYCOSYLATED HEMOGLOBIN (HGB A1C): Hemoglobin A1C: 5.7 % — AB (ref 4.0–5.6)

## 2020-06-08 LAB — T4, FREE: Free T4: 0.81 ng/dL (ref 0.60–1.60)

## 2020-06-08 MED ORDER — ACCU-CHEK FASTCLIX LANCETS MISC
3 refills | Status: DC
Start: 2020-06-08 — End: 2022-07-22

## 2020-06-08 NOTE — Progress Notes (Signed)
Patient ID: Katie Harris, female   DOB: [birth year], 45 y.o.   MRN: [id number]  This visit occurred during the SARS-CoV-2 public health emergency.  Safety protocols were in place, including screening questions prior to the visit, additional usage of staff PPE, and extensive cleaning of exam room while observing appropriate contact time as indicated for disinfecting solutions.   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 Last visit 6 months ago.  Interim history: At this visit, she denies changes since last visit: No increased urination, blurry vision, or GI symptoms.  However, she does have a history of IBS and has nausea, abdominal pain and diarrhea after higher fiber meals.   Reviewed history: Pt was diagnosed with thyrotoxicosis in 11/2014. At that time, she was taking several thyrotoxic supplements and also took Biotin.  Before our first visit, she was on biotin, B6 vitamin, [place], but was previously on [place], [employer] - stopped 3-4 weeks prior to the appt. I advised her to stay off all the supplements and hold the Biotin few days before next TFTs.  Her TSI antibodies were not elevated.   TFTs continued to fluctuate but she continued to have suppressed TSH and elevated free T3.  Thyroid Uptake and scan which she had on 10/25/2015: Elevated 24 hour radio iodine uptake of 42%.  Large hot nodule within LEFT thyroid lobe with suppression of uptake within the RIGHT lobe consistent with a toxic adenoma.   11/09/2015: RAI treatment  She developed post ablative hypothyroidism afterwards.  We started on levothyroxine 50 mcg daily and increase the dose to 75 mcg daily 05/2018.  She takes this: - in am - fasting - at least 30 min from b'fast - no Ca, Fe, PPIs - + MVIs in the pm - not on  Biotin  Reviewed her TFTs: Lab Results  Component Value Date   TSH 1.96 11/21/2019   TSH 2.57 01/14/2019   TSH 3.71 05/10/2018   TSH 3.35 12/22/2017   TSH 6.830 (H) 09/08/2017   TSH 4.780 (H) 04/23/2017   TSH 4.260 01/06/2017   TSH 2.78 07/29/2016   TSH 2.37 03/31/2016   TSH 0.11 (L) 12/21/2015   FREET4 0.68 11/21/2019   FREET4 1.0 01/14/2019   FREET4 0.95 05/10/2018   FREET4 0.84 12/22/2017   FREET4 0.90 09/08/2017   FREET4 0.95 04/23/2017   FREET4 0.72 07/29/2016   FREET4 0.55 (L) 03/31/2016   FREET4 0.89 12/21/2015   FREET4 1.60 10/15/2015   T3FREE 3.3 07/29/2016   T3FREE 2.7 03/31/2016   T3FREE 3.8 12/21/2015   T3FREE 4.5 (H) 10/15/2015   T3FREE 5.0 (H) 08/31/2015   T3FREE 5.2 (H) 07/19/2015  11/07/2014: TSH <0.01, fT4 1.54 (0.61-1.18)  TSI's were not elevated: Lab Results  Component Value Date   TSI <89 07/19/2015   Pt denies: - feeling nodules in neck - hoarseness - dysphagia - choking - SOB with lying down  Pt does have a FH of thyroid ds: hypothyroidism in mother, possibly MGM. No FH of thyroid cancer. No h/o radiation tx to head or neck other than RAI treatment.  No herbal supplements. No Biotin use. No recent steroids use.   She also has a history of PCOS and pseudotumor cerebri.  DM2: - dx'ed 2019  She is currently on: - Metformin 500 mg >> 850 mg 2x a day   -  Ozempic 0.5 mg weekly - started 01/2019 - had mild nausea >> resolved  She is not checking sugars after running out of lancets: - am:181-200 >> 136, 146 >> 102-132 >> n/c - 2h after b'fast: 159-245 >> 142 >> 109, 111 >> n/c - lunch: 138, 197-291 >> 116 >> n/c >> 134 - 2h after lunch: 174-219 >> 127, 136, 167 >> 107-124 >> n/c - dinner: 165-246 >> 120 >> 95-121 >> n/c - 2h after dinner: 204 >> n/c - bedtime: 182 >> n/c Lowest: 89 Highest: 145  Reviewed HbA1c levels: Lab Results  Component Value Date   HGBA1C 5.5 11/21/2019   HGBA1C 5.8 (A) 04/21/2019   HGBA1C 7.9 (A) 01/14/2019    HGBA1C 6.6 (A) 05/10/2018   HGBA1C 5.9 (H) 09/08/2017   HGBA1C 7.4 (H) 04/23/2017   Normal kidney function: Lab Results  Component Value Date   BUN 9 06/09/2019   Lab Results  Component Value Date   CREATININE 0.62 06/09/2019   CREATININE 0.70 04/21/2019   CREATININE 0.78 09/08/2017   CREATININE 0.71 04/23/2017   CREATININE 0.68 01/06/2017   CREATININE 0.62 10/09/2016   CREATININE 0.65 07/09/2016   CREATININE 0.62 03/22/2012   + HL: Lab Results  Component Value Date   CHOL 209 (H) 04/21/2019   HDL 35.60 (L) 04/21/2019   LDLCALC 168 (H) 09/08/2017   LDLDIRECT 143.0 04/21/2019   TRIG 233.0 (H) 04/21/2019   CHOLHDL 6 04/21/2019   Last eye exam was in 06/22/2019: No DR  She is on alpha-lipoic acid and Neurontin.  ROS: + See HPI Constitutional: no weight gain/no weight loss, no fatigue, no subjective hyperthermia, no subjective hypothermia Eyes: no blurry vision, no xerophthalmia ENT: no sore throat, + see HPI Cardiovascular: no CP/no SOB/no palpitations/no leg swelling Respiratory: no cough/no SOB/no wheezing Gastrointestinal: no N/no V/no D/no C/no acid reflux Musculoskeletal: no muscle aches/no joint aches Skin: no rashes, no hair loss Neurological: no tremors/no numbness/no tingling/no dizziness  I reviewed pt's medications, allergies, PMH, social hx, family hx, and changes were documented in the history of present illness. Otherwise, unchanged from my initial visit note.  Past Medical History:  Diagnosis Date  . Anxiety   . Arthritis    lower back  . Back pain   . 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 Cancer ([place])    basal cell removed from left hairline area  . Depression   . Diabetes mellitus without complication ([place])    recent A1C 6.5 no meds presently  . Factor 5 Leiden mutation, heterozygous ([place]) 04/21/2012   Lab 04/16/12 in view of positive Leiden in her father; no personal hx of thrombosis despite prior pregnancy & C-section 2007  .  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 HTN (hypertension), benign 04/21/2012  . Hypertension   . Hyperthyroidism    levels were abnormal in Dec- no treatment currently  . Hyperthyroidism 04/21/2012  . Infertility, female   . Leg edema   . PCOS (polycystic ovarian syndrome)   . Peripheral vascular disease ([facility])    neuropathy bilateral feet  . Prediabetes   . Sleep apnea    Past Surgical History:  Procedure Laterality Date  . CESAREAN SECTION  2007  . DILATATION & CURETTAGE/HYSTEROSCOPY WITH MYOSURE N/A 10/17/2016   Procedure: DILATATION & CURETTAGE/HYSTEROSCOPY WITH MYOSURE POLYPECTOMY;  Surgeon: Paula Compton, MD;  Location: [facility];  Service: Gynecology;  Laterality: N/A;  myosure rep will be here confirmed on  10/06/16  . DILATION AND CURETTAGE OF UTERUS     polpectomy  . INTRAUTERINE DEVICE (IUD) INSERTION N/A 10/17/2016   Procedure: INTRAUTERINE DEVICE (IUD) INSERTION MIRENA;  Surgeon: Paula Compton, MD;  Location: [facility];  Service: Gynecology;  Laterality: N/A;  . TONSILLECTOMY     Social History   Social History  . Marital Status: Married    Spouse Name: N/A  . Number of Children: 1   Occupational History  . Project coordinator   Social History Main Topics  . Smoking status: Never Smoker   . Smokeless tobacco: Not on file  . Alcohol Use: No  . Drug Use: No   Current Outpatient Medications on File Prior to Visit  Medication Sig Dispense Refill  . Accu-Chek FastClix Lancets MISC Use to check blood sugar once a day 100 each 11  . ACCU-CHEK GUIDE test strip USE TO CHECK BLOOD GLUCOSE ONCE DAILY AS DIRECTED 100 strip 11  . Alpha-Lipoic Acid 600 MG CAPS Take 600 mg by mouth daily.    . Blood Glucose Monitoring Suppl (ACCU-CHEK GUIDE) w/Device KIT 1 kit by Does not apply route 2 (two) times daily. 1 kit 0  . cyclobenzaprine (FLEXERIL) 10 MG tablet at bedtime.   3  . DULoxetine (CYMBALTA) 60 MG capsule Take 60 mg by mouth daily.   6  . gabapentin  (NEURONTIN) 300 MG capsule Take 2 capsules (600 mg total) by mouth 3 (three) times daily. 180 capsule 11  . hyoscyamine (ANASPAZ) 0.125 MG TBDP disintergrating tablet Place 0.125 mg under the tongue 4 (four) times daily -  before meals and at bedtime.     Marland Kitchen levothyroxine (SYNTHROID) 75 MCG tablet TAKE 1 TABLET(75 MCG) BY MOUTH DAILY BEFORE BREAKFAST. 90 tablet 3  . metFORMIN (GLUCOPHAGE) 850 MG tablet Take 1 tablet (850 mg total) by mouth 2 (two) times daily with a meal. 180 tablet 3  . metoprolol (LOPRESSOR) 50 MG tablet Take 50 mg by mouth 2 (two) times daily.     . Multiple Vitamins-Minerals (ALIVE MULTI-VITAMIN PO) Take by mouth.    . Semaglutide,0.25 or 0.5MG /DOS, (OZEMPIC, 0.25 OR 0.5 MG/DOSE,) 2 MG/1.5ML SOPN Inject 0.5 mg into the skin once a week. 4.5 mL 3  . topiramate (TOPAMAX) 25 MG tablet Take 2 tablets (50 mg total) by mouth daily. 180 tablet 3  . VITAMIN D PO Take by mouth daily.     No current facility-administered medications on file prior to visit.   No Known Allergies Family History  Problem Relation Age of Onset  . Hypertension Mother   . Hyperlipidemia Mother   . Thyroid disease Mother   . Depression Mother   . Anxiety disorder Mother   . Sleep apnea Mother   . Obesity Mother   . Kidney cancer Mother 67  . Heart disease Father   . Diabetes Father   . Hypertension Father   . Hyperlipidemia Father   . Obesity Father   . Diabetes Maternal Grandmother   . Hyperlipidemia Maternal Grandmother   . Hyperlipidemia Maternal Grandfather   . Hyperlipidemia Paternal Grandmother   . Diabetes Paternal Grandmother   . Cancer Paternal Grandmother   . Heart disease Paternal Grandfather     PE: BP 128/82 (BP Location: Right Arm, Patient Position: Sitting, Cuff Size: Large)   Pulse 81   Ht 5\' 8"  (1.727 m)   Wt (!) 337 lb 6.4 oz (153 kg)   SpO2 98%   BMI 51.30 kg/m  Wt Readings from Last 3  Encounters:  06/08/20 (!) 337 lb 6.4 oz (153 kg)  11/21/19 (!) 331 lb (150.1 kg)   07/07/19 (!) 339 lb (153.8 kg)   Constitutional: overweight, in NAD Eyes: PERRLA, EOMI, no exophthalmos ENT: moist mucous membranes, no thyromegaly, no cervical lymphadenopathy Cardiovascular: RRR, No MRG Respiratory: CTA B Gastrointestinal: abdomen soft, NT, ND, BS+ Musculoskeletal: no deformities, strength intact in all 4 Skin: moist, warm, no rashes Neurological: no tremor with outstretched hands, DTR normal in all 4  ASSESSMENT: 1.  Post ablative hypothyroidism -After RAI treatment for toxic adenoma  2. DM2, non-insulin-dependent,  with complications -Peripheral neuropathy  3.  Obesity class III  PLAN:  1. Patient with history of thyrotoxicosis with a diagnosis of left toxic adenoma, now status post RAI treatment in 11/2015, after which she developed post ablative hypothyroidism - latest thyroid labs reviewed with pt >> normal: Lab Results  Component Value Date   TSH 1.96 11/21/2019   - she continues on LT4 75 mcg daily - pt feels good on this dose. - we discussed about taking the thyroid hormone every day, with water, >30 minutes before breakfast, separated by >4 hours from acid reflux medications, calcium, iron, multivitamins. Pt. is taking it correctly. - will check thyroid tests today: TSH and fT4 - If labs are abnormal, she will need to return for repeat TFTs in 1.5 months  3. DM2 -She continues Metformin and Ozempic.  Sugars improved significantly after adding Ozempic.  At last visit she had occasional nausea with it but wanted to continue.  Afterwards, nausea resolved. -At last visit almost all of her blood sugar values were at goal and an HbA1c was excellent, at 5.5%.  We did not change her regimen. -At today's visit, she did not check sugars consistently as she ran out of lancets.  I refilled them at this visit.  Whenever she checked, sugars were at goal.  We will not change her regimen today. -I advised her to: Patient Instructions  Please continue: - Metformin  850 mg 2x a day - Ozempic 0.5 mg weekly  Please return in 6 months with your sugar log.   - we checked her HbA1c: 5.7% (higher) - advised to check sugars at different times of the day - 1x a day, rotating check times - advised for yearly eye exams >> she is UTD - return to clinic in 6 months  3.  Obesity class III -She lost 15 pounds before last visit and 23 pounds before the previous visit but gained 6 pounds since last visit -We will continue Ozempic which should also help with weight loss -She is also on Topamax for headaches, we discussed that this can also help with weight loss  Component     Latest Ref Rng & Units 06/08/2020  T4,Free(Direct)     0.60 - 1.60 ng/dL 0.81  TSH     0.35 - 4.50 uIU/mL 1.50  Normal TFTs.  Philemon Kingdom, MD PhD [facility]

## 2020-06-08 NOTE — Patient Instructions (Signed)
Please continue: - Metformin 850 mg 2x a day - Ozempic 0.5 mg weekly  Please continue: - levothyroxine 75 mcg daily.  Take the thyroid hormone every day, with water, at least 30 minutes before breakfast, separated by at least 4 hours from: - acid reflux medications - calcium - iron - multivitamins  Please return in 6 months with your sugar log.

## 2020-07-09 ENCOUNTER — Encounter: Payer: Self-pay | Admitting: Family Medicine

## 2020-07-09 ENCOUNTER — Other Ambulatory Visit: Payer: Self-pay

## 2020-07-09 ENCOUNTER — Ambulatory Visit: Payer: Managed Care, Other (non HMO) | Admitting: Family Medicine

## 2020-07-09 VITALS — BP 138/81 | HR 92 | Ht 69.0 in | Wt 344.0 lb

## 2020-07-09 DIAGNOSIS — Z9989 Dependence on other enabling machines and devices: Secondary | ICD-10-CM

## 2020-07-09 DIAGNOSIS — G4733 Obstructive sleep apnea (adult) (pediatric): Secondary | ICD-10-CM | POA: Diagnosis not present

## 2020-07-09 MED ORDER — GABAPENTIN 300 MG PO CAPS: 600.0000 mg | ORAL_CAPSULE | Freq: Three times a day (TID) | ORAL | 11 refills | Status: DC

## 2020-07-09 MED ORDER — TOPIRAMATE 25 MG PO TABS: 50.0000 mg | ORAL_TABLET | Freq: Every day | ORAL | 3 refills | Status: DC

## 2020-07-09 NOTE — Progress Notes (Addendum)
PATIENT: Katie Harris DOB: 1976/02/01  REASON FOR VISIT: follow up HISTORY FROM: patient  Chief Complaint  Patient presents with  . Obstructive Sleep Apnea    RM 1 alone Pt is well, CPAP does help her sleep      HISTORY OF PRESENT ILLNESS: 07/09/20 ALL: She returns for follow up for migraines (Dr Jaynee Eagles) and OSA on CPAP (Dr Rexene Alberts). She continues topiramate 50mg  daily and gabapentin 300/600/900. She feels that she is doing well. She does continues to have some sort of mild headache most days. She has about 2 severe migraines per month. She will take Tylenol and rest and migraine usually resolves by the next day. She has tried abortive meds (unsure of which) in the past but did not like how they made her feel. She does not wish to add prevention meds.     She continues to do well with CPAP therapy. She is using therapy nightly. She does feel it helps with sleep. She denies concerns with machine or supplies.      07/07/2019 ALL:  Katie Harris is a 45 y.o. female here today for follow up of headaches and neuropathy. She continues topiramate. Dose decreased from 50 to 25mg  daily due to numbness and tingling of fingers but now feels it may have been more related to getting a COVID vaccine. She has resumed 50mg  dosing. Headaches are well managed. She may have 2-3 per month. .She continues gabapentin 300/600/900. She occasionally takes 300mg  at bedtime if neuropathy is bad. She continues CPAP nightly. BP has been normal. Last A1C was 5.3. She is now on Ozepmpic. She has lost 45 pounds. Last eye exam 2 weeks ago and needed new lenses.    HISTORY: (copied from my note on 01/06/2019)   Katie Harris is a 45 y.o. female here today for follow up of headaches and neuropathy pain. We started her back on topiramate in April. She has taken 50mg  at bedtime and feels that headaches have improved. She continues to have mild daily headaches. She reports bifrontal retro orbital headaches. Feels  pressure behind her eyes. She has not had any migraines/migraious symptoms over the past 4-5 months. She reports neuropathy has been stable. She has had more pain today than normal. She has had some tingling of right ring finger. This is not limiting her activity. She reports taking gabapentin 300mg  in am, 600mg  at 3p and 900mg  at bedtime. 600mg  TID ordered with Dr Jaynee Eagles in 06/2017. She is working closely with her endocrinologist for elevated CBG. Readings are consistently around 200. She was diagnosed with IBS recently. Blood pressures are usually 130's/90's. Today's reading was 156/100 and 144/92 on recheck.  She is doing very well with CPAP therapy at home.  She reports nightly compliance.  Compliance report reviewed from 11/05/2018 through 01/04/2019.  She is using CPAP therapy nightly and for greater than 4 hours each night.  Average usage is 8 hours and 48 minutes.  AHI is 1.9 on 5 to 13 cm of water and an EPR of 3.  There is no significant leak.    REVIEW OF SYSTEMS: Out of a complete 14 system review of symptoms, the patient complains only of the following symptoms, headaches, neuropathy and all other reviewed systems are negative.  ESS:1 FSS:36  ALLERGIES: No Known Allergies  HOME MEDICATIONS: Outpatient Medications Prior to Visit  Medication Sig Dispense Refill  . Accu-Chek FastClix Lancets MISC Use to check blood sugar once a day 100 each 3  .  ACCU-CHEK GUIDE test strip USE TO CHECK BLOOD GLUCOSE ONCE DAILY AS DIRECTED 100 strip 11  . Alpha-Lipoic Acid 600 MG CAPS Take 600 mg by mouth daily.    . Blood Glucose Monitoring Suppl (ACCU-CHEK GUIDE) w/Device KIT 1 kit by Does not apply route 2 (two) times daily. 1 kit 0  . cyclobenzaprine (FLEXERIL) 10 MG tablet at bedtime.   3  . DULoxetine (CYMBALTA) 60 MG capsule Take 60 mg by mouth daily.   6  . hyoscyamine (ANASPAZ) 0.125 MG TBDP disintergrating tablet Place 0.125 mg under the tongue 4 (four) times daily -  before meals and at  bedtime.     Marland Kitchen levothyroxine (SYNTHROID) 75 MCG tablet TAKE 1 TABLET(75 MCG) BY MOUTH DAILY BEFORE BREAKFAST. 90 tablet 3  . metFORMIN (GLUCOPHAGE) 850 MG tablet Take 1 tablet (850 mg total) by mouth 2 (two) times daily with a meal. 180 tablet 3  . metoprolol (LOPRESSOR) 50 MG tablet Take 50 mg by mouth 2 (two) times daily.     . Multiple Vitamins-Minerals (ALIVE MULTI-VITAMIN PO) Take by mouth.    . Semaglutide,0.25 or 0.5MG /DOS, (OZEMPIC, 0.25 OR 0.5 MG/DOSE,) 2 MG/1.5ML SOPN Inject 0.5 mg into the skin once a week. 4.5 mL 3  . VITAMIN D PO Take by mouth daily.    Marland Kitchen gabapentin (NEURONTIN) 300 MG capsule Take 2 capsules (600 mg total) by mouth 3 (three) times daily. 180 capsule 11  . topiramate (TOPAMAX) 25 MG tablet Take 2 tablets (50 mg total) by mouth daily. 180 tablet 3   No facility-administered medications prior to visit.    PAST MEDICAL HISTORY: Past Medical History:  Diagnosis Date  . Anxiety   . Arthritis    lower back  . Back pain   . 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 Cancer ([place])    basal cell removed from left hairline area  . Depression   . Diabetes mellitus without complication ([place])    recent A1C 6.5 no meds presently  . Factor 5 Leiden mutation, heterozygous ([place]) 04/21/2012   Lab 04/16/12 in view of positive Leiden in her father; no personal hx of thrombosis despite prior pregnancy & C-section 2007  . 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 HTN (hypertension), benign 04/21/2012  . Hypertension   . Hyperthyroidism    levels were abnormal in Dec- no treatment currently  . Hyperthyroidism 04/21/2012  . Infertility, female   . Leg edema   . PCOS (polycystic ovarian syndrome)   . Peripheral vascular disease ([facility])    neuropathy bilateral feet  . Prediabetes   . Sleep apnea     PAST SURGICAL HISTORY: Past Surgical History:  Procedure Laterality Date  . CESAREAN SECTION  2007  . DILATATION & CURETTAGE/HYSTEROSCOPY WITH MYOSURE N/A  10/17/2016   Procedure: DILATATION & CURETTAGE/HYSTEROSCOPY WITH MYOSURE POLYPECTOMY;  Surgeon: Paula Compton, MD;  Location: [facility];  Service: Gynecology;  Laterality: N/A;  myosure rep will be here confirmed on 10/06/16  . DILATION AND CURETTAGE OF UTERUS     polpectomy  . INTRAUTERINE DEVICE (IUD) INSERTION N/A 10/17/2016   Procedure: INTRAUTERINE DEVICE (IUD) INSERTION MIRENA;  Surgeon: Paula Compton, MD;  Location: [facility];  Service: Gynecology;  Laterality: N/A;  . TONSILLECTOMY      FAMILY HISTORY: Family History  Problem Relation Age of Onset  . Hypertension Mother   . Hyperlipidemia Mother   . Thyroid disease Mother   . Depression  Mother   . Anxiety disorder Mother   . Sleep apnea Mother   . Obesity Mother   . Kidney cancer Mother 43  . Heart disease Father   . Diabetes Father   . Hypertension Father   . Hyperlipidemia Father   . Obesity Father   . Diabetes Maternal Grandmother   . Hyperlipidemia Maternal Grandmother   . Hyperlipidemia Maternal Grandfather   . Hyperlipidemia Paternal Grandmother   . Diabetes Paternal Grandmother   . Cancer Paternal Grandmother   . Heart disease Paternal Grandfather     SOCIAL HISTORY: Social History   Socioeconomic History  . Marital status: Married    Spouse name: Dorisann Schwanke  . Number of children: 1  . Years of education: Not on file  . Highest education level: Some college, no degree  Occupational History  . Occupation: Secretary/administrator  Tobacco Use  . Smoking status: Never Smoker  . Smokeless tobacco: Never Used  Vaping Use  . Vaping Use: Never used  Substance and Sexual Activity  . Alcohol use: No  . Drug use: No  . Sexual activity: Yes    Birth control/protection: None  Other Topics Concern  . Not on file  Social History Narrative   Lives at home with her husband, child and dog    Right handed   No caffeine      Social Determinants of Radio broadcast assistant Strain: Not on file  Food  Insecurity: Not on file  Transportation Needs: Not on file  Physical Activity: Not on file  Stress: Not on file  Social Connections: Not on file  Intimate Partner Violence: Not on file      PHYSICAL EXAM  Vitals:   07/09/20 1505  BP: 138/81  Pulse: 92  Weight: (!) 344 lb (156 kg)  Height: $Remove'5\' 9"'[id number]$  (1.753 m)   Body mass index is 50.8 kg/m.  Generalized: Well developed, in no acute distress  Cardiology: normal rate and rhythm, no murmur noted Respiratory: clear to auscultation bilaterally  Neurological examination  Mentation: Alert oriented to time, place, history taking. Follows all commands speech and language fluent Cranial nerve II-XII: Pupils were equal round reactive to light. Extraocular movements were full, visual field were full on confrontational test. Facial sensation and strength were normal.  Motor: The motor testing reveals 5 over 5 strength of all 4 extremities. Good symmetric motor tone is noted throughout.  Sensory: Sensory testing is intact to soft touch on all 4 extremities. No evidence of extinction is noted. Pinprick testing normal of bilateral lower extremities  Coordination: Cerebellar testing reveals good finger-nose-finger and heel-to-shin bilaterally.  Gait and station: Gait is normal.   DIAGNOSTIC DATA (LABS, IMAGING, TESTING) - I reviewed patient records, labs, notes, testing and imaging myself where available.  No flowsheet data found.   Lab Results  Component Value Date   WBC 8.6 06/09/2019   HGB 13.3 06/09/2019   HCT 39.5 06/09/2019   MCV 88.4 06/09/2019   PLT 325.0 06/09/2019      Component Value Date/Time   NA 137 06/09/2019 1502   NA 137 09/08/2017 1021   K 3.9 06/09/2019 1502   CL 103 06/09/2019 1502   CO2 25 06/09/2019 1502   GLUCOSE 92 06/09/2019 1502   BUN 9 06/09/2019 1502   BUN 13 09/08/2017 1021   CREATININE 0.62 06/09/2019 1502   CALCIUM 9.4 06/09/2019 1502   PROT 7.3 06/09/2019 1502   PROT 7.0 09/08/2017 1021   ALBUMIN  4.4  06/09/2019 1502   ALBUMIN 4.1 09/08/2017 1021   AST 17 06/09/2019 1502   ALT 22 06/09/2019 1502   ALKPHOS 58 06/09/2019 1502   BILITOT 0.4 06/09/2019 1502   BILITOT 0.4 09/08/2017 1021   GFRNONAA 93 09/08/2017 1021   GFRAA 107 09/08/2017 1021   Lab Results  Component Value Date   CHOL 209 (H) 04/21/2019   HDL 35.60 (L) 04/21/2019   LDLCALC 168 (H) 09/08/2017   LDLDIRECT 143.0 04/21/2019   TRIG 233.0 (H) 04/21/2019   CHOLHDL 6 04/21/2019   Lab Results  Component Value Date   HGBA1C 5.7 (A) 06/08/2020   Lab Results  Component Value Date   VITAMINB12 398 04/23/2017   Lab Results  Component Value Date   TSH 1.50 06/08/2020       ASSESSMENT AND PLAN 45 y.o. year old female  has a past medical history of Anxiety, Arthritis, Back pain, Blood dyscrasia, Cancer ([place]), Depression, Diabetes mellitus without complication ([place]), Factor 5 Leiden mutation, heterozygous ([place]) (04/21/2012), Headache(784.0), HTN (hypertension), benign (04/21/2012), Hypertension, Hyperthyroidism, Hyperthyroidism (04/21/2012), Infertility, female, Leg edema, PCOS (polycystic ovarian syndrome), Peripheral vascular disease ([place]), Prediabetes, and Sleep apnea. here with     ICD-10-CM   1. OSA on CPAP  G47.33 For home use only DME continuous positive airway pressure (CPAP)   Z99.89      [place] continues to do well. She does have mild headaches fairly frequently but does not wish to change medication regimen. We will continue topiramate 50mg  daily and gabapentin 300mg  in am, 600mg  at lunch and 900mg  at bedtime. She may take an extra dose (300mg ) of gabapentin at bedtime as needed. She may continue Tylenol as needed for abortive therapy. She thinks triptans caused significant side effects in past and prefers to avoid. May consider Nurtec or Ubrelvy if needed. Continue CPAP nightly and greater than 4 hours each night. She was encouraged to focus on healthy lifestyle habits with well balanced diet and regular  exercise. She will continue close follow up with PCP and endocrinology. Follow up with [name] in 1 year, sooner if needed.    Orders Placed This Encounter  Procedures  . For home use only DME continuous positive airway pressure (CPAP)    Supplies    Order Specific Question:   Length of Need    Answer:   Lifetime    Order Specific Question:   Patient has OSA or probable OSA    Answer:   Yes    Order Specific Question:   Is the patient currently using CPAP in the home    Answer:   Yes    Order Specific Question:   Settings    Answer:   Other see comments    Order Specific Question:   CPAP supplies needed    Answer:   Mask, headgear, cushions, filters, heated tubing and water chamber     Meds ordered this encounter  Medications  . topiramate (TOPAMAX) 25 MG tablet    Sig: Take 2 tablets (50 mg total) by mouth daily.    Dispense:  180 tablet    Refill:  3    Order Specific Question:   Supervising Provider    Answer:   Melvenia Beam [id number]  . gabapentin (NEURONTIN) 300 MG capsule    Sig: Take 2 capsules (600 mg total) by mouth 3 (three) times daily.    Dispense:  180 capsule    Refill:  11    Order Specific Question:   Supervising Provider  Answer:   Melvenia Beam [[id number]]       QJE ADGNP, FNP-C 07/09/2020, 3:30 PM [facility] [address], [place], [place] [address] [phone]    I reviewed the above note and documentation by the Nurse Practitioner and agree with the history, exam, assessment and plan as outlined above. I was available for consultation. Star Age, MD, PhD [facility])

## 2020-07-09 NOTE — Patient Instructions (Signed)
Below is our plan:  We will continue topiramate and gabapentin. Let me know if headaches worsen and we can consider other options for management.   Please make sure you are staying well hydrated. I recommend 50-60 ounces daily. Well balanced diet and regular exercise encouraged. Consistent sleep schedule with 6-8 hours recommended.   Please continue follow up with care team as directed.   Follow up with me in 1 year   You may receive a survey regarding today's visit. I encourage you to leave honest feed back as I do use this information to improve patient care. Thank you for seeing me today!     Please continue using your CPAP regularly. While your insurance requires that you use CPAP at least 4 hours each night on 70% of the nights, I recommend, that you not skip any nights and use it throughout the night if you can. Getting used to CPAP and staying with the treatment long term does take time and patience and discipline. Untreated obstructive sleep apnea when it is moderate to severe can have an adverse impact on cardiovascular health and raise her risk for heart disease, arrhythmias, hypertension, congestive heart failure, stroke and diabetes. Untreated obstructive sleep apnea causes sleep disruption, nonrestorative sleep, and sleep deprivation. This can have an impact on your day to day functioning and cause daytime sleepiness and impairment of cognitive function, memory loss, mood disturbance, and problems focussing. Using CPAP regularly can improve these symptoms.   Sleep Apnea Sleep apnea affects breathing during sleep. It causes breathing to stop for a short time or to become shallow. It can also increase the risk of:  Heart attack.  Stroke.  Being very overweight (obese).  Diabetes.  Heart failure.  Irregular heartbeat. The goal of treatment is to help you breathe normally again. What are the causes? There are three kinds of sleep apnea:  Obstructive sleep apnea. This is  caused by a blocked or collapsed airway.  Central sleep apnea. This happens when the brain does not send the right signals to the muscles that control breathing.  Mixed sleep apnea. This is a combination of obstructive and central sleep apnea. The most common cause of this condition is a collapsed or blocked airway. This can happen if:  Your throat muscles are too relaxed.  Your tongue and tonsils are too large.  You are overweight.  Your airway is too small.   What increases the risk?  Being overweight.  Smoking.  Having a small airway.  Being older.  Being female.  Drinking alcohol.  Taking medicines to calm yourself (sedatives or tranquilizers).  Having family members with the condition. What are the signs or symptoms?  Trouble staying asleep.  Being sleepy or tired during the day.  Getting angry a lot.  Loud snoring.  Headaches in the morning.  Not being able to focus your mind (concentrate).  Forgetting things.  Less interest in sex.  Mood swings.  Personality changes.  Feelings of sadness (depression).  Waking up a lot during the night to pee (urinate).  Dry mouth.  Sore throat. How is this diagnosed?  Your medical history.  A physical exam.  A test that is done when you are sleeping (sleep study). The test is most often done in a sleep lab but may also be done at home. How is this treated?  Sleeping on your side.  Using a medicine to get rid of mucus in your nose (decongestant).  Avoiding the use of alcohol, medicines to  help you relax, or certain pain medicines (narcotics).  Losing weight, if needed.  Changing your diet.  Not smoking.  Using a machine to open your airway while you sleep, such as: ? An oral appliance. This is a mouthpiece that shifts your lower jaw forward. ? A CPAP device. This device blows air through a mask when you breathe out (exhale). ? An EPAP device. This has valves that you put in each nostril. ? A  BPAP device. This device blows air through a mask when you breathe in (inhale) and breathe out.  Having surgery if other treatments do not work. It is important to get treatment for sleep apnea. Without treatment, it can lead to:  High blood pressure.  Coronary artery disease.  In men, not being able to have an erection (impotence).  Reduced thinking ability.   Follow these instructions at home: Lifestyle  Make changes that your doctor recommends.  Eat a healthy diet.  Lose weight if needed.  Avoid alcohol, medicines to help you relax, and some pain medicines.  Do not use any products that contain nicotine or tobacco, such as cigarettes, e-cigarettes, and chewing tobacco. If you need help quitting, ask your doctor. General instructions  Take over-the-counter and prescription medicines only as told by your doctor.  If you were given a machine to use while you sleep, use it only as told by your doctor.  If you are having surgery, make sure to tell your doctor you have sleep apnea. You may need to bring your device with you.  Keep all follow-up visits as told by your doctor. This is important. Contact a doctor if:  The machine that you were given to use during sleep bothers you or does not seem to be working.  You do not get better.  You get worse. Get help right away if:  Your chest hurts.  You have trouble breathing in enough air.  You have an uncomfortable feeling in your back, arms, or stomach.  You have trouble talking.  One side of your body feels weak.  A part of your face is hanging down. These symptoms may be an emergency. Do not wait to see if the symptoms will go away. Get medical help right away. Call your local emergency services (911 in the [place] Do not drive yourself to the hospital. Summary  This condition affects breathing during sleep.  The most common cause is a collapsed or blocked airway.  The goal of treatment is to help you breathe  normally while you sleep. This information is not intended to replace advice given to you by your health care provider. Make sure you discuss any questions you have with your health care provider. Document Revised: 12/11/2017 Document Reviewed: 10/20/2017 Elsevier Patient Education  2021 [place].   Migraine Headache A migraine headache is a very strong throbbing pain on one side or both sides of your head. This type of headache can also cause other symptoms. It can last from 4 hours to 3 days. Talk with your doctor about what things may bring on (trigger) this condition. What are the causes? The exact cause of this condition is not known. This condition may be triggered or caused by:  Drinking alcohol.  Smoking.  Taking medicines, such as: ? Medicine used to treat chest pain (nitroglycerin). ? Birth control pills. ? Estrogen. ? Some blood pressure medicines.  Eating or drinking certain products.  Doing physical activity. Other things that may trigger a migraine headache include:  Having a menstrual period.  Pregnancy.  Hunger.  Stress.  Not getting enough sleep or getting too much sleep.  Weather changes.  Tiredness (fatigue). What increases the risk?  Being 73-48 years old.  Being female.  Having a family history of migraine headaches.  Being Caucasian.  Having depression or anxiety.  Being very overweight. What are the signs or symptoms?  A throbbing pain. This pain may: ? Happen in any area of the head, such as on one side or both sides. ? Make it hard to do daily activities. ? Get worse with physical activity. ? Get worse around bright lights or loud noises.  Other symptoms may include: ? Feeling sick to your stomach (nauseous). ? Vomiting. ? Dizziness. ? Being sensitive to bright lights, loud noises, or smells.  Before you get a migraine headache, you may get warning signs (an aura). An aura may include: ? Seeing flashing lights or having  blind spots. ? Seeing bright spots, halos, or zigzag lines. ? Having tunnel vision or blurred vision. ? Having numbness or a tingling feeling. ? Having trouble talking. ? Having weak muscles.  Some people have symptoms after a migraine headache (postdromal phase), such as: ? Tiredness. ? Trouble thinking (concentrating). How is this treated?  Taking medicines that: ? Relieve pain. ? Relieve the feeling of being sick to your stomach. ? Prevent migraine headaches.  Treatment may also include: ? Having acupuncture. ? Avoiding foods that bring on migraine headaches. ? Learning ways to control your body functions (biofeedback). ? Therapy to help you know and deal with negative thoughts (cognitive behavioral therapy). Follow these instructions at home: Medicines  Take over-the-counter and prescription medicines only as told by your doctor.  Ask your doctor if the medicine prescribed to you: ? Requires you to avoid driving or using heavy machinery. ? Can cause trouble pooping (constipation). You may need to take these steps to prevent or treat trouble pooping:  Drink enough fluid to keep your pee (urine) pale yellow.  Take over-the-counter or prescription medicines.  Eat foods that are high in fiber. These include beans, whole grains, and fresh fruits and vegetables.  Limit foods that are high in fat and sugar. These include fried or sweet foods. Lifestyle  Do not drink alcohol.  Do not use any products that contain nicotine or tobacco, such as cigarettes, e-cigarettes, and chewing tobacco. If you need help quitting, ask your doctor.  Get at least 8 hours of sleep every night.  Limit and deal with stress. General instructions  Keep a journal to find out what may bring on your migraine headaches. For example, write down: ? What you eat and drink. ? How much sleep you get. ? Any change in what you eat or drink. ? Any change in your medicines.  If you have a migraine  headache: ? Avoid things that make your symptoms worse, such as bright lights. ? It may help to lie down in a dark, quiet room. ? Do not drive or use heavy machinery. ? Ask your doctor what activities are safe for you.  Keep all follow-up visits as told by your doctor. This is important.      Contact a doctor if:  You get a migraine headache that is different or worse than others you have had.  You have more than 15 headache days in one month. Get help right away if:  Your migraine headache gets very bad.  Your migraine headache lasts longer than 72 hours.  You have a fever.  You have a stiff neck.  You have trouble seeing.  Your muscles feel weak or like you cannot control them.  You start to lose your balance a lot.  You start to have trouble walking.  You pass out (faint).  You have a seizure. Summary  A migraine headache is a very strong throbbing pain on one side or both sides of your head. These headaches can also cause other symptoms.  This condition may be treated with medicines and changes to your lifestyle.  Keep a journal to find out what may bring on your migraine headaches.  Contact a doctor if you get a migraine headache that is different or worse than others you have had.  Contact your doctor if you have more than 15 headache days in a month. This information is not intended to replace advice given to you by your health care provider. Make sure you discuss any questions you have with your health care provider. Document Revised: 06/18/2018 Document Reviewed: 04/08/2018 Elsevier Patient Education  [place].

## 2020-11-21 ENCOUNTER — Other Ambulatory Visit: Payer: Self-pay | Admitting: Internal Medicine

## 2020-12-11 ENCOUNTER — Encounter: Payer: Self-pay | Admitting: Internal Medicine

## 2020-12-11 ENCOUNTER — Ambulatory Visit: Payer: Managed Care, Other (non HMO) | Admitting: Internal Medicine

## 2020-12-11 ENCOUNTER — Other Ambulatory Visit: Payer: Self-pay

## 2020-12-11 VITALS — BP 158/92 | HR 102 | Ht 69.0 in | Wt 345.8 lb

## 2020-12-11 DIAGNOSIS — E119 Type 2 diabetes mellitus without complications: Secondary | ICD-10-CM | POA: Diagnosis not present

## 2020-12-11 DIAGNOSIS — Z6841 Body Mass Index (BMI) 40.0 and over, adult: Secondary | ICD-10-CM | POA: Diagnosis not present

## 2020-12-11 DIAGNOSIS — E890 Postprocedural hypothyroidism: Secondary | ICD-10-CM | POA: Diagnosis not present

## 2020-12-11 LAB — POCT GLYCOSYLATED HEMOGLOBIN (HGB A1C): Hemoglobin A1C: 5.8 % — AB (ref 4.0–5.6)

## 2020-12-11 MED ORDER — OZEMPIC (1 MG/DOSE) 4 MG/3ML ~~LOC~~ SOPN
1.0000 mg | PEN_INJECTOR | SUBCUTANEOUS | 3 refills | Status: DC
Start: 2020-12-11 — End: 2021-04-01

## 2020-12-11 NOTE — Progress Notes (Signed)
Patient ID: Katie Harris, female   DOB: [birth year], 45 y.o.   MRN: [id number]  This visit occurred during the SARS-CoV-2 public health emergency.  Safety protocols were in place, including screening questions prior to the visit, additional usage of staff PPE, and extensive cleaning of exam room while observing appropriate contact time as indicated for disinfecting solutions.   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 Last visit 6 months ago.  Interim history: She has a history of IBS and continues to have abdominal pain and diarrhea after higher fiber meals. She cannot continue on high fiber meals because of this. She feels very tired.   Reviewed history: Pt was diagnosed with thyrotoxicosis in 11/2014. At that time, she was taking several thyrotoxic supplements and also took Biotin.  Before our first visit, she was on biotin, B6 vitamin, [place], but was previously on [place], [employer] - stopped 3-4 weeks prior to the appt. I advised her to stay off all the supplements and hold the Biotin few days before next TFTs.  Her TSI antibodies were not elevated.   TFTs continued to fluctuate but she continued to have suppressed TSH and elevated free T3.  Thyroid Uptake and scan which she had on 10/25/2015: Elevated 24 hour radio iodine uptake of 42%.  Large hot nodule within LEFT thyroid lobe with suppression of uptake within the RIGHT lobe consistent with a toxic adenoma.   11/09/2015: RAI treatment  She developed post ablative hypothyroidism afterwards.  We started on levothyroxine 50 mcg daily and increase the dose to 75 mcg daily 05/2018.  She takes this: - in am - fasting - at least 30 min from b'fast - no Ca, Fe, PPIs - + MVIs in the pm - + on Biotin in Hair Skin and Nails - last dose  yesterday pm  Reviewed her TFTs: Lab Results  Component Value Date   TSH 1.50 06/08/2020   TSH 1.96 11/21/2019   TSH 2.57 01/14/2019   TSH 3.71 05/10/2018   TSH 3.35 12/22/2017   TSH 6.830 (H) 09/08/2017   TSH 4.780 (H) 04/23/2017   TSH 4.260 01/06/2017   TSH 2.78 07/29/2016   TSH 2.37 03/31/2016   FREET4 0.81 06/08/2020   FREET4 0.68 11/21/2019   FREET4 1.0 01/14/2019   FREET4 0.95 05/10/2018   FREET4 0.84 12/22/2017   FREET4 0.90 09/08/2017   FREET4 0.95 04/23/2017   FREET4 0.72 07/29/2016   FREET4 0.55 (L) 03/31/2016   FREET4 0.89 12/21/2015   T3FREE 3.3 07/29/2016   T3FREE 2.7 03/31/2016   T3FREE 3.8 12/21/2015   T3FREE 4.5 (H) 10/15/2015   T3FREE 5.0 (H) 08/31/2015   T3FREE 5.2 (H) 07/19/2015  11/07/2014: TSH <0.01, fT4 1.54 (0.61-1.18)  TSI's were not elevated: Lab Results  Component Value Date   TSI <89 07/19/2015   Pt denies: - feeling nodules in neck - hoarseness - dysphagia - choking - SOB with lying down  Pt does have a FH of thyroid ds: hypothyroidism in mother, possibly MGM. No FH of thyroid cancer. No h/o radiation tx to head or neck other than RAI treatment.  No herbal supplements. No recent steroids use.   She also has a history of OSA, PCOS, and pseudotumor cerebri.  DM2: - dx'ed 2019  She is currently on: - Metformin 500 mg >> 850 mg 2x a day   -  Ozempic 0.5 mg weekly - started 01/2019 - had mild nausea >> resolved  She is checking sugars 0 to 1x a day - am:181-200 >> 136, 146 >> 102-132 >> n/c >> 122-147, 150 - 2h after b'fast: 159-245 >> 142 >> 109, 111 >> n/c - lunch: 138, 197-291 >> 116 >> n/c >> 134 >> n/c - 2h after lunch: 127, 136, 167 >> 107-124 >> n/c >> 94-140, 157, 158 - dinner: 165-246 >> 120 >> 95-121 >> n/c >> 119, 128, 133 - 2h after dinner: 204 >> n/c - bedtime: 182 >> n/c Lowest: 89 >> 94 Highest: 145 >> 158  Reviewed HbA1c levels: Lab Results  Component Value Date   HGBA1C 5.7 (A) 06/08/2020   HGBA1C 5.5  11/21/2019   HGBA1C 5.8 (A) 04/21/2019   HGBA1C 7.9 (A) 01/14/2019   HGBA1C 6.6 (A) 05/10/2018   HGBA1C 5.9 (H) 09/08/2017   HGBA1C 7.4 (H) 04/23/2017   Normal kidney function: Lab Results  Component Value Date   BUN 9 06/09/2019   Lab Results  Component Value Date   CREATININE 0.62 06/09/2019   CREATININE 0.70 04/21/2019   CREATININE 0.78 09/08/2017   CREATININE 0.71 04/23/2017   CREATININE 0.68 01/06/2017   CREATININE 0.62 10/09/2016   CREATININE 0.65 07/09/2016   CREATININE 0.62 03/22/2012   + HL: Lab Results  Component Value Date   CHOL 209 (H) 04/21/2019   HDL 35.60 (L) 04/21/2019   LDLCALC 168 (H) 09/08/2017   LDLDIRECT 143.0 04/21/2019   TRIG 233.0 (H) 04/21/2019   CHOLHDL 6 04/21/2019   Last eye exam was in 2022: No DR reportedly.  She is on alpha-lipoic acid and Neurontin.  ROS: + See HPI Constitutional: no weight gain/no weight loss, no fatigue, no subjective hyperthermia, no subjective hypothermia Eyes: no blurry vision, no xerophthalmia ENT: no sore throat, + see HPI Cardiovascular: no CP/no SOB/no palpitations/no leg swelling Respiratory: no cough/no SOB/no wheezing Gastrointestinal: no N/no V/no D/no C/no acid reflux Musculoskeletal: no muscle aches/no joint aches Skin: no rashes, no hair loss Neurological: no tremors/no numbness/no tingling/no dizziness  I reviewed pt's medications, allergies, PMH, social hx, family hx, and changes were documented in the history of present illness. Otherwise, unchanged from my initial visit note.  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    Cancer ([place])    basal cell removed from left hairline area   Depression    Diabetes mellitus without complication (HCC)    recent A1C 6.5 no meds presently   Factor 5 Leiden mutation, heterozygous ([place]) 04/21/2012   Lab 04/16/12 in view of positive Leiden in her father; no personal hx  of thrombosis despite prior pregnancy & C-section 2007   Headache(784.0)    chronic due to pseudo tumor cerebri   HTN (hypertension), benign 04/21/2012   Hypertension    Hyperthyroidism    levels were abnormal in Dec- no treatment currently   Hyperthyroidism 04/21/2012   Infertility, female    Leg edema    PCOS (polycystic ovarian syndrome)    Peripheral vascular disease ([place])    neuropathy bilateral feet   Prediabetes    Sleep apnea    Past Surgical History:  Procedure Laterality Date   CESAREAN SECTION  2007   DILATATION & CURETTAGE/HYSTEROSCOPY WITH MYOSURE N/A 10/17/2016   Procedure: DILATATION & CURETTAGE/HYSTEROSCOPY WITH MYOSURE POLYPECTOMY;  Surgeon:  Paula Compton, MD;  Location: [facility];  Service: Gynecology;  Laterality: N/A;  myosure rep will be here confirmed on 10/06/16   DILATION AND CURETTAGE OF UTERUS     polpectomy   INTRAUTERINE DEVICE (IUD) INSERTION N/A 10/17/2016   Procedure: INTRAUTERINE DEVICE (IUD) INSERTION MIRENA;  Surgeon: Paula Compton, MD;  Location: [facility];  Service: Gynecology;  Laterality: N/A;   TONSILLECTOMY     Social History   Social History   Marital Status: Married    Spouse Name: N/A   Number of Children: 1   Occupational History   Secretary/administrator   Social History Main Topics   Smoking status: Never Smoker    Smokeless tobacco: Not on file   Alcohol Use: No   Drug Use: No   Current Outpatient Medications on File Prior to Visit  Medication Sig Dispense Refill   Accu-Chek FastClix Lancets MISC Use to check blood sugar once a day 100 each 3   ACCU-CHEK GUIDE test strip USE TO CHECK BLOOD GLUCOSE ONCE DAILY AS DIRECTED 100 strip 11   Alpha-Lipoic Acid 600 MG CAPS Take 600 mg by mouth daily.     Blood Glucose Monitoring Suppl (ACCU-CHEK GUIDE) w/Device KIT 1 kit by Does not apply route 2 (two) times daily. 1 kit 0   cyclobenzaprine (FLEXERIL) 10 MG tablet at bedtime.   3   DULoxetine (CYMBALTA) 60 MG capsule Take 60 mg by  mouth daily.   6   gabapentin (NEURONTIN) 300 MG capsule Take 2 capsules (600 mg total) by mouth 3 (three) times daily. 180 capsule 11   hyoscyamine (ANASPAZ) 0.125 MG TBDP disintergrating tablet Place 0.125 mg under the tongue 4 (four) times daily -  before meals and at bedtime.      levothyroxine (SYNTHROID) 75 MCG tablet TAKE 1 TABLET(75 MCG) BY MOUTH DAILY BEFORE BREAKFAST 90 tablet 3   metFORMIN (GLUCOPHAGE) 850 MG tablet Take 1 tablet (850 mg total) by mouth 2 (two) times daily with a meal. 180 tablet 3   metoprolol (LOPRESSOR) 50 MG tablet Take 50 mg by mouth 2 (two) times daily.      Multiple Vitamins-Minerals (ALIVE MULTI-VITAMIN PO) Take by mouth.     OZEMPIC, 0.25 OR 0.5 MG/DOSE, 2 MG/1.5ML SOPN INJECT 0.5 MG UNDER THE SKIN ONCE A WEEK 4.5 mL 3   topiramate (TOPAMAX) 25 MG tablet Take 2 tablets (50 mg total) by mouth daily. 180 tablet 3   VITAMIN D PO Take by mouth daily.     No current facility-administered medications on file prior to visit.   No Known Allergies Family History  Problem Relation Age of Onset   Hypertension Mother    Hyperlipidemia Mother    Thyroid disease Mother    Depression Mother    Anxiety disorder Mother    Sleep apnea Mother    Obesity Mother    Kidney cancer Mother 19   Heart disease Father    Diabetes Father    Hypertension Father    Hyperlipidemia Father    Obesity Father    Diabetes Maternal Grandmother    Hyperlipidemia Maternal Grandmother    Hyperlipidemia Maternal Grandfather    Hyperlipidemia Paternal Grandmother    Diabetes Paternal Grandmother    Cancer Paternal Grandmother    Heart disease Paternal Grandfather     PE: BP (!) 158/92 (BP Location: Right Arm, Patient Position: Sitting, Cuff Size: Normal)   Pulse (!) 102   Ht $R'5\' 9"'IQ$  (1.753 m)   Wt (!) 345  lb 12.8 oz (156.9 kg)   SpO2 95%   BMI 51.07 kg/m  Wt Readings from Last 3 Encounters:  12/11/20 (!) 345 lb 12.8 oz (156.9 kg)  07/09/20 (!) 344 lb (156 kg)  06/08/20 (!)  337 lb 6.4 oz (153 kg)   Constitutional: overweight, in NAD Eyes: PERRLA, EOMI, no exophthalmos ENT: moist mucous membranes, no thyromegaly, no cervical lymphadenopathy Cardiovascular: Tachycardia, RR, No MRG Respiratory: CTA B Gastrointestinal: abdomen soft, NT, ND, BS+ Musculoskeletal: no deformities, strength intact in all 4 Skin: moist, warm, no rashes Neurological: no tremor with outstretched hands, DTR normal in all 4  ASSESSMENT: 1.  Post ablative hypothyroidism -After RAI treatment for toxic adenoma  2. DM2, non-insulin-dependent,  with complications -Peripheral neuropathy  3.  Obesity class III  PLAN:  1. Patient with history of thyrotoxicosis with a diagnosis of left toxic adenoma, now status post RAI treatment in 11/2015, after which she developed post ablative hypothyroidism - latest thyroid labs reviewed with pt. >> normal: Lab Results  Component Value Date   TSH 1.50 06/08/2020  -However, at today's visit, she tells me that she is taking hair skin and nails, with biotin every afternoon.  She actually took this before the last set of labs. - she continues on LT4 75 mcg daily - pt feels very fatigued - we discussed about taking the thyroid hormone every day, with water, >30 minutes before breakfast, separated by >4 hours from acid reflux medications, calcium, iron, multivitamins. Pt. is taking it correctly. - will plan to hold her vitamins with biotin and have her back for labs in 3 to 4 days.  3. DM2 -Patient is well controlled on metformin and Ozempic.  Sugars improved significantly after adding the GLP-1 receptor agonist.  She initially had nausea with it, but this resolved.  At last visit, blood sugars were at goal but she was not checking sugars consistently after she ran out of lancets.  HbA1c was only slightly higher than before but still excellent, at 5.7%.  We did not change her regimen.   -At today's visit, sugars are usually at or slightly above goal in the  morning and they are occasionally above target later in the day. -At this visit, his visit to increase the dose of Ozempic to 1 mg weekly.  We are using this for both improving diabetes control and especially for decreasing appetite and improving weight. -I advised her to: Patient Instructions  Please continue: - Metformin 850 mg 2x a day  Please increase: - Ozempic 1 mg weekly  Please continue: - levothyroxine 75 mcg daily.  Take the thyroid hormone every day, with water, at least 30 minutes before breakfast, separated by at least 4 hours from: - acid reflux medications - calcium - iron - multivitamins  Please hold Biotin and return for labs in 3-4 days.  Please return in 4-6 months with your sugar log.   - we checked her HbA1c: 5.8% (slightly higher) - advised to check sugars at different times of the day - 1x a day, rotating check times - advised for yearly eye exams >> she is UTD - return to clinic in 4-6 months  3.  Obesity class III -She gained 6 pounds before last visit, previously lost more than 30. -We will continue Ozempic which should also help with weight loss and will try to increase the dose now to see if this can help further with weight loss -She is also on Topamax for headaches and we discussed that  this can also help with weight loss  Philemon Kingdom, MD PhD [facility] Endocrinology

## 2020-12-11 NOTE — Patient Instructions (Addendum)
Please continue: - Metformin 850 mg 2x a day  Please increase: - Ozempic 1 mg weekly  Please continue: - levothyroxine 75 mcg daily.  Take the thyroid hormone every day, with water, at least 30 minutes before breakfast, separated by at least 4 hours from: - acid reflux medications - calcium - iron - multivitamins  Please hold Biotin and return for labs in 3-4 days.  Please return in 4-6 months with your sugar log.

## 2020-12-17 ENCOUNTER — Other Ambulatory Visit: Payer: Self-pay

## 2020-12-17 ENCOUNTER — Other Ambulatory Visit (INDEPENDENT_AMBULATORY_CARE_PROVIDER_SITE_OTHER): Payer: Managed Care, Other (non HMO)

## 2020-12-17 DIAGNOSIS — E890 Postprocedural hypothyroidism: Secondary | ICD-10-CM | POA: Diagnosis not present

## 2020-12-17 LAB — T4, FREE: Free T4: 0.79 ng/dL (ref 0.60–1.60)

## 2020-12-17 LAB — TSH: TSH: 1.26 u[IU]/mL (ref 0.35–5.50)

## 2020-12-18 ENCOUNTER — Other Ambulatory Visit: Payer: Self-pay | Admitting: Internal Medicine

## 2021-03-20 ENCOUNTER — Other Ambulatory Visit: Payer: Self-pay

## 2021-03-20 MED ORDER — TOPIRAMATE 25 MG PO TABS: 50.0000 mg | ORAL_TABLET | Freq: Every day | ORAL | 1 refills | Status: DC

## 2021-03-20 MED ORDER — GABAPENTIN 300 MG PO CAPS: 600.0000 mg | ORAL_CAPSULE | Freq: Three times a day (TID) | ORAL | 5 refills | Status: DC

## 2021-04-01 ENCOUNTER — Other Ambulatory Visit: Payer: Self-pay

## 2021-04-01 ENCOUNTER — Other Ambulatory Visit: Payer: Self-pay | Admitting: Obstetrics and Gynecology

## 2021-04-01 ENCOUNTER — Telehealth: Payer: Self-pay | Admitting: Internal Medicine

## 2021-04-01 DIAGNOSIS — R928 Other abnormal and inconclusive findings on diagnostic imaging of breast: Secondary | ICD-10-CM

## 2021-04-01 MED ORDER — LEVOTHYROXINE SODIUM 75 MCG PO TABS: ORAL_TABLET | ORAL | 3 refills | Status: DC

## 2021-04-01 MED ORDER — LEVOTHYROXINE SODIUM 75 MCG PO TABS: ORAL_TABLET | ORAL | 0 refills | Status: DC

## 2021-04-01 MED ORDER — OZEMPIC (1 MG/DOSE) 4 MG/3ML ~~LOC~~ SOPN: 1.0000 mg | PEN_INJECTOR | SUBCUTANEOUS | 0 refills | Status: DC

## 2021-04-01 MED ORDER — METFORMIN HCL 850 MG PO TABS: ORAL_TABLET | ORAL | 0 refills | Status: DC

## 2021-04-01 MED ORDER — OZEMPIC (1 MG/DOSE) 4 MG/3ML ~~LOC~~ SOPN: 1.0000 mg | PEN_INJECTOR | SUBCUTANEOUS | 3 refills | Status: DC

## 2021-04-01 MED ORDER — METFORMIN HCL 850 MG PO TABS: ORAL_TABLET | ORAL | 3 refills | Status: DC

## 2021-04-01 NOTE — Telephone Encounter (Signed)
MEDICATION:  levothyroxine (SYNTHROID) 75 MCG tablet metFORMIN (GLUCOPHAGE) 850 MG tablet  Semaglutide, 1 MG/DOSE, (OZEMPIC, 1 MG/DOSE,) 4 MG/3ML SOPN  PHARMACY:   [id number] [address], [place] AT [address] Phone:  [phone]  Fax:  [phone]      HAS THE PATIENT CONTACTED THEIR PHARMACY?  NO  IS THIS A 90 DAY SUPPLY : 30 DAYS  IS PATIENT OUT OF MEDICATION: YES  IF NOT; HOW MUCH IS LEFT:   LAST APPOINTMENT DATE: @10 /01/2021  NEXT APPOINTMENT DATE:@4 /06/2021  DO WE HAVE YOUR PERMISSION TO LEAVE A DETAILED MESSAGE?:  OTHER COMMENTS:    **Let patient know to contact pharmacy at the end of the day to make sure medication is ready. **  ** Please notify patient to allow 48-72 hours to process**  **Encourage patient to contact the pharmacy for refills or they can request refills through [facility]**

## 2021-04-01 NOTE — Telephone Encounter (Signed)
Message left for patient to callback. Patient should still have refills from script on 11/21/20 and 10/22 for year supply

## 2021-04-01 NOTE — Telephone Encounter (Signed)
30 day supply has been sent to [facility] and a 90 day supply with refills [facility]

## 2021-04-25 ENCOUNTER — Ambulatory Visit
Admission: RE | Admit: 2021-04-25 | Discharge: 2021-04-25 | Disposition: A | Discharge status: Home / Self Care | Payer: Managed Care, Other (non HMO) | Source: Ambulatory Visit | Attending: Obstetrics and Gynecology | Admitting: Obstetrics and Gynecology

## 2021-04-25 DIAGNOSIS — R928 Other abnormal and inconclusive findings on diagnostic imaging of breast: Secondary | ICD-10-CM

## 2021-06-10 NOTE — Progress Notes (Addendum)
Patient ID: Katie Harris, female   DOB: 1975-03-16, 46 y.o.   MRN: [id number] ? ?This visit occurred during the SARS-CoV-2 public health emergency.  Safety protocols were in place, including screening questions prior to the visit, additional usage of staff PPE, and extensive cleaning of exam room while observing appropriate contact time as indicated for disinfecting solutions.  ? 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 Last visit 6 months ago. ? ?Interim history: ?She has a history of IBS and has abdominal pain and diarrhea after higher fiber meals. She cannot continue on high fiber meals because of this. ?No increased urination, blurry vision, nausea, chest pain. ? ?Reviewed history: ?Pt was diagnosed with thyrotoxicosis in 11/2014. At that time, she was taking several thyrotoxic supplements and also took Biotin. ? ?Before our first visit, she was on biotin, B6 vitamin, [place], but was previously on [place], [employer] - stopped 3-4 weeks prior to the appt. I advised her to stay off all the supplements and hold the Biotin few days before next TFTs. ? ?Her TSI antibodies were not elevated.  ? ?TFTs continued to fluctuate but she continued to have suppressed TSH and elevated free T3. ? ?Thyroid Uptake and scan which she had on 10/25/2015: ?Elevated 24 hour radio iodine uptake of 42%. ? ?Large hot nodule within LEFT thyroid lobe with suppression of uptake ?within the RIGHT lobe consistent with a toxic adenoma. ? ? 11/09/2015: RAI treatment ? ?She developed post ablative hypothyroidism afterwards.  We started on levothyroxine 50 mcg daily and increased the dose to 75 mcg daily 05/2018.  She takes this: ?- in am ?- fasting ?- at least 30 min from b'fast ?- no Ca, Fe, PPIs ?- + MVIs in the pm ?- + on Biotin in Hair Skin and  Nails (stopped before this visit) ? ?Reviewed her TFTs: ?Lab Results  ?Component Value Date  ? TSH 1.26 12/17/2020  ? TSH 1.50 06/08/2020  ? TSH 1.96 11/21/2019  ? TSH 2.57 01/14/2019  ? TSH 3.71 05/10/2018  ? TSH 3.35 12/22/2017  ? TSH 6.830 (H) 09/08/2017  ? TSH 4.780 (H) 04/23/2017  ? TSH 4.260 01/06/2017  ? TSH 2.78 07/29/2016  ? FREET4 0.79 12/17/2020  ? FREET4 0.81 06/08/2020  ? FREET4 0.68 11/21/2019  ? FREET4 1.0 01/14/2019  ? FREET4 0.95 05/10/2018  ? FREET4 0.84 12/22/2017  ? FREET4 0.90 09/08/2017  ? FREET4 0.95 04/23/2017  ? FREET4 0.72 07/29/2016  ? FREET4 0.55 (L) 03/31/2016  ? T3FREE 3.3 07/29/2016  ? T3FREE 2.7 03/31/2016  ? T3FREE 3.8 12/21/2015  ? T3FREE 4.5 (H) 10/15/2015  ? T3FREE 5.0 (H) 08/31/2015  ? T3FREE 5.2 (H) 07/19/2015  ?11/07/2014: TSH <0.01, fT4 1.54 (0.61-1.18) ? ?TSI's were not elevated: ?Lab Results  ?Component Value Date  ? TSI <89 07/19/2015  ? ?Pt denies: ?- feeling nodules in neck ?- hoarseness ?- dysphagia ?- choking ?- SOB with lying down ? ?Pt does have a FH of thyroid ds: hypothyroidism in mother, possibly MGM. No FH of thyroid cancer. No h/o radiation tx to head or neck other than RAI treatment. ? ?No herbal supplements. No recent steroids use.  ? ?She also has a history of OSA, PCOS, and pseudotumor cerebri. ? ?DM2: ?- dx'ed 2019 ? ?She is currently on: ?- Metformin 500 mg >> 850 mg 2x a day   ?-  Ozempic 0.5 mg weekly - started 01/2019 - had mild nausea >> resolved >> 1 mg weekly ? ?She is checking sugars 0 to 1x a day ?- am:136, 146 >> 102-132 >> n/c >> 122-147, 150 >> 120-135, 140 ?- 2h after b'fast: 159-245 >> 142 >> 109, 111 >> n/c >> 118, 138 ?- lunch: 138, 197-291 >> 116 >> n/c >> 134 >> n/c >> 112 ?- 2h after lunch:  107-124 >> n/c >> 94-140, 157, 158 >> 100-130 ?- dinner: 165-246 >> 120 >> 95-121 >> n/c >> 119, 128, 133 >> n/c ?- 2h after dinner: 204 >> n/c >> 128 ?- bedtime: 182 >> n/c ?Lowest: 89 >> 94 >> 98. ?Highest: 145 >> 158 >> 200 x1. ? ?Reviewed HbA1c  levels: ?Lab Results  ?Component Value Date  ? HGBA1C 5.8 (A) 12/11/2020  ? HGBA1C 5.7 (A) 06/08/2020  ? HGBA1C 5.5 11/21/2019  ? HGBA1C 5.8 (A) 04/21/2019  ? HGBA1C 7.9 (A) 01/14/2019  ? HGBA1C 6.6 (A) 05/10/2018  ? HGBA1C 5.9 (H) 09/08/2017  ? HGBA1C 7.4 (H) 04/23/2017  ? ?Normal kidney function: ?Lab Results  ?Component Value Date  ? BUN 9 06/09/2019  ? ?Lab Results  ?Component Value Date  ? CREATININE 0.62 06/09/2019  ? CREATININE 0.70 04/21/2019  ? CREATININE 0.78 09/08/2017  ? CREATININE 0.71 04/23/2017  ? CREATININE 0.68 01/06/2017  ? CREATININE 0.62 10/09/2016  ? CREATININE 0.65 07/09/2016  ? CREATININE 0.62 03/22/2012  ? ?+ HL: ?Lab Results  ?Component Value Date  ? CHOL 209 (H) 04/21/2019  ? HDL 35.60 (L) 04/21/2019  ? LDLCALC 168 (H) 09/08/2017  ? LDLDIRECT 143.0 04/21/2019  ? TRIG 233.0 (H) 04/21/2019  ? CHOLHDL 6 04/21/2019  ?She was found to have fatty liver on abdominal ultrasound from 09/28/2020. ? ?Last eye exam was in 2023: No DR reportedly. ? ?She is on alpha-lipoic acid and Neurontin. ? ?ROS:  ?+ see HPI ? ?I reviewed pt's medications, allergies, PMH, social hx, family hx, and changes were documented in the history of present illness. Otherwise, unchanged from my initial visit note. ? ?Past Medical History:  ?Diagnosis Date  ? Anxiety   ? Arthritis   ? lower back  ? Back pain   ? Blood dyscrasia   ? 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 Cancer [facility])   ? basal cell removed from left hairline area  ? Depression   ? Diabetes mellitus without complication ([place])   ? recent A1C 6.5 no meds presently  ? Factor 5 Leiden mutation, heterozygous ([place]) 04/21/2012  ? Lab 04/16/12 in view of positive Leiden in her father; no personal hx of thrombosis despite prior pregnancy & C-section 2007  ? Headache(784.0)   ? chronic due to pseudo tumor cerebri  ? HTN (hypertension), benign 04/21/2012  ? Hypertension   ? Hyperthyroidism   ? levels were abnormal in Dec- no treatment currently  ? Hyperthyroidism 04/21/2012  ? Infertility,  female   ? Leg edema   ? PCOS (polycystic ovarian syndrome)   ? Peripheral vascular disease ([place])   ? neuropathy bilateral feet  ? Prediabetes   ? Sleep apnea   ? ?Past Surgical History:  ?Procedure Laterality Date  ? CESAREAN SECTION  2007  ? DILATATION & CURETTAGE/HYSTEROSCOPY WITH MYOSURE N/A 10/17/2016  ? Procedure: DILATATION & CURETTAGE/HYSTEROSCOPY WITH MYOSURE POLYPECTOMY;  Surgeon: Paula Compton, MD;  Location: [facility];  Service: Gynecology;  Laterality: N/A;  myosure rep will be here confirmed on 10/06/16  ? DILATION AND CURETTAGE OF  UTERUS    ? polpectomy  ? INTRAUTERINE DEVICE (IUD) INSERTION N/A 10/17/2016  ? Procedure: INTRAUTERINE DEVICE (IUD) INSERTION MIRENA;  Surgeon: Paula Compton, MD;  Location: [place] [facility];  Service: Gynecology;  Laterality: N/A;  ? TONSILLECTOMY    ? ?Social History  ? ?Social History  ? Marital Status: Married  ?  Spouse Name: N/A  ? Number of Children: 1  ? 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 ?Social History Main Topics  ? Smoking status: Never Smoker   ? Smokeless tobacco: Not on file  ? Alcohol Use: No  ? Drug Use: No  ? ?Current Outpatient Medications on File Prior to Visit  ?Medication Sig Dispense Refill  ? Accu-Chek FastClix Lancets MISC Use to check blood sugar once a day 100 each 3  ? ACCU-CHEK GUIDE test strip USE TO CHECK BLOOD GLUCOSE ONCE DAILY AS DIRECTED 100 strip 11  ? Alpha-Lipoic Acid 600 MG CAPS Take 600 mg by mouth daily.    ? Blood Glucose Monitoring Suppl (ACCU-CHEK GUIDE) w/Device KIT 1 kit by Does not apply route 2 (two) times daily. 1 kit 0  ? cyclobenzaprine (FLEXERIL) 10 MG tablet at bedtime.   3  ? DULoxetine (CYMBALTA) 60 MG capsule Take 60 mg by mouth daily.   6  ? gabapentin (NEURONTIN) 300 MG capsule Take 2 capsules (600 mg total) by mouth 3 (three) times daily. 180 capsule 5  ? hyoscyamine (ANASPAZ) 0.125 MG TBDP disintergrating tablet Place 0.125 mg under the tongue 4 (four) times daily -  before meals and at bedtime.     ?  levothyroxine (SYNTHROID) 75 MCG tablet TAKE 1 TABLET(75 MCG) BY MOUTH DAILY BEFORE BREAKFAST 30 tablet 0  ? metFORMIN (GLUCOPHAGE) 850 MG tablet TAKE 1 TABLET(850 MG) BY MOUTH TWICE DAILY WITH A MEAL 60 tablet 0  ? me

## 2021-06-11 ENCOUNTER — Ambulatory Visit: Payer: Managed Care, Other (non HMO) | Admitting: Internal Medicine

## 2021-06-11 ENCOUNTER — Encounter: Payer: Self-pay | Admitting: Internal Medicine

## 2021-06-11 VITALS — BP 118/72 | HR 85 | Ht 69.0 in | Wt 323.0 lb

## 2021-06-11 DIAGNOSIS — Z6841 Body Mass Index (BMI) 40.0 and over, adult: Secondary | ICD-10-CM

## 2021-06-11 DIAGNOSIS — E119 Type 2 diabetes mellitus without complications: Secondary | ICD-10-CM

## 2021-06-11 DIAGNOSIS — E890 Postprocedural hypothyroidism: Secondary | ICD-10-CM

## 2021-06-11 LAB — POCT GLYCOSYLATED HEMOGLOBIN (HGB A1C): Hemoglobin A1C: 5.7 % — AB (ref 4.0–5.6)

## 2021-06-11 LAB — LIPID PANEL
Cholesterol: 231 mg/dL — ABNORMAL HIGH (ref 0–200)
HDL: 42.6 mg/dL (ref >39.00)
LDL Cholesterol: 159 mg/dL — ABNORMAL HIGH (ref 0–99)
NonHDL: 188.14
Total CHOL/HDL Ratio: 5
Triglycerides: 148 mg/dL (ref 0.0–149.0)
VLDL: 29.6 mg/dL (ref 0.0–40.0)

## 2021-06-11 LAB — TSH: TSH: 1.54 u[IU]/mL (ref 0.35–5.50)

## 2021-06-11 LAB — T4, FREE: Free T4: 0.77 ng/dL (ref 0.60–1.60)

## 2021-06-11 LAB — COMPREHENSIVE METABOLIC PANEL
ALT: 32 U/L (ref 0–35)
AST: 27 U/L (ref 0–37)
Albumin: 4.3 g/dL (ref 3.5–5.2)
Alkaline Phosphatase: 61 U/L (ref 39–117)
BUN: 9 mg/dL (ref 6–23)
CO2: 25 mEq/L (ref 19–32)
Calcium: 9.4 mg/dL (ref 8.4–10.5)
Chloride: 103 mEq/L (ref 96–112)
Creatinine, Ser: 0.68 mg/dL (ref 0.40–1.20)
GFR: 103.33 mL/min (ref >60.00)
Glucose, Bld: 117 mg/dL — ABNORMAL HIGH (ref 70–99)
Potassium: 4.3 mEq/L (ref 3.5–5.1)
Sodium: 139 mEq/L (ref 135–145)
Total Bilirubin: 0.5 mg/dL (ref 0.2–1.2)
Total Protein: 6.9 g/dL (ref 6.0–8.3)

## 2021-06-11 LAB — MICROALBUMIN / CREATININE URINE RATIO
Creatinine,U: 233 mg/dL
Microalb Creat Ratio: 7 mg/g (ref 0.0–30.0)
Microalb, Ur: 16.2 mg/dL — ABNORMAL HIGH (ref 0.0–1.9)

## 2021-06-11 NOTE — Patient Instructions (Signed)
Please continue: - Metformin 850 mg 2x a day - Ozempic 1 mg weekly  Please continue: - levothyroxine 75 mcg daily.  Take the thyroid hormone every day, with water, at least 30 minutes before breakfast, separated by at least 4 hours from: - acid reflux medications - calcium - iron - multivitamins  Please return in 6 months with your sugar log.  

## 2021-06-19 ENCOUNTER — Encounter: Payer: Self-pay | Admitting: Internal Medicine

## 2021-06-19 MED ORDER — LEVOTHYROXINE SODIUM 75 MCG PO TABS: ORAL_TABLET | ORAL | 3 refills | Status: DC

## 2021-06-19 NOTE — Addendum Note (Signed)
Addended by: Philemon Kingdom on: 06/19/2021 04:43 PM ? ? Modules accepted: Orders ? ?

## 2021-06-20 ENCOUNTER — Other Ambulatory Visit: Payer: Self-pay | Admitting: Internal Medicine

## 2021-06-20 MED ORDER — PRAVASTATIN SODIUM 20 MG PO TABS: 20.0000 mg | ORAL_TABLET | Freq: Every day | ORAL | 3 refills | Status: DC

## 2021-07-08 ENCOUNTER — Telehealth: Payer: Self-pay | Admitting: Family Medicine

## 2021-07-08 NOTE — Progress Notes (Signed)
? ?PATIENT: Katie Harris ?DOB: 1975-05-12 ? ?REASON FOR VISIT: follow up ?HISTORY FROM: patient ? ?Virtual Visit via Telephone Note ? ?I connected with Katie Harris on 07/09/21 at  8:15 AM EDT by telephone and verified that I am speaking with the correct person using two identifiers. ?  ?I discussed the limitations, risks, security and privacy concerns of performing an evaluation and management service by telephone and the availability of in person appointments. I also discussed with the patient that there may be a patient responsible charge related to this service. The patient expressed understanding and agreed to proceed. ? ? ?History of Present Illness: ? ?07/09/21 ALL: ?Katie Harris is a 46 y.o. female here today for follow up for migraines and neuropathy ([place]) and OSA on CPAP ([place]). She continues topiramate '50mg'$  daily and gabapentin 300/600/900. She reports headaches are unchanged. She has a headache most every day. She is having 5-6 migrainous headaches every month. Tylenol and rest are usually effective to abort migraine. Neuropathy stable. She is using CPAP nightly for at least 4 hours. No concerns with machine or supplies.  ? ? ? ?07/09/20 ALL: ?She returns for follow up for migraines (Dr Jaynee Eagles) and OSA on CPAP (Dr Rexene Alberts). She continues topiramate '50mg'$  daily and gabapentin 300/600/900. She feels that she is doing well. She does continues to have some sort of mild headache most days. She has about 2 severe migraines per month. She will take Tylenol and rest and migraine usually resolves by the next day. She has tried abortive meds (unsure of which) in the past but did not like how they made her feel. She does not wish to add prevention meds.   ?  ?She continues to do well with CPAP therapy. She is using therapy nightly. She does feel it helps with sleep. She denies concerns with machine or supplies.  ?  ?  ?  ? ? ?Observations/Objective: ? ?Generalized: Well developed, in no acute distress   ?Mentation: Alert oriented to time, place, history taking. Follows all commands speech and language fluent ? ? ?Assessment and Plan: ? ?46 y.o. year old female  has a past medical history of Anxiety, Arthritis, Back pain, Blood dyscrasia, Cancer ([place]), Depression, Diabetes mellitus without complication ([place]), Factor 5 Leiden mutation, heterozygous ([place]) (04/21/2012), Headache(784.0), HTN (hypertension), benign (04/21/2012), Hypertension, Hyperthyroidism, Hyperthyroidism (04/21/2012), Infertility, female, Leg edema, PCOS (polycystic ovarian syndrome), Peripheral vascular disease ([place]), Prediabetes, and Sleep apnea. here with ? ?  ICD-10-CM   ?1. Chronic migraine without aura without status migrainosus, not intractable  G43.709   ?  ?2. Diabetic polyneuropathy associated with diabetes mellitus due to underlying condition ([place])  E08.42   ?  ?3. OSA on CPAP  G47.33   ? Z99.89   ?  ? ?Katie Harris is doing well, today. CPAP compliance is excellent and apnea well managed. She will continue nightly use for at least 4 hours. She continues to have daily headaches with at least 5-6 moderate migraine headaches per month. She will continue topiramate '50mg'$  daily. I will add Ajovy injections every 30 days. Potential side effects and proper storage/administration discussed. Education material provided in AVS. Continue Tylenol for abortive therapy. She will continue gabapentin 300/600/900 for migraines and diabetic neuropathy. Healthy lifestyle habits encouraged. She will follow up with me in 4 months, sooner if needed. She verbalizes understanding and agreement with this plan.  ? ?No orders of the defined types were placed in this encounter. ? ? ?Meds ordered this encounter  ?Medications  ?  Fremanezumab-vfrm (AJOVY) 225 MG/1.5ML SOAJ  ?  Sig: Inject 225 mg into the skin every 30 (thirty) days.  ?  Dispense:  4.5 mL  ?  Refill:  3  ?  Order Specific Question:   Supervising Provider  ?  AnswerMelvenia Beam [[id number]]  ? ? ? ?Follow Up  Instructions: ? ?I discussed the assessment and treatment plan with the patient. The patient was provided an opportunity to ask questions and all were answered. The patient agreed with the plan and demonstrated an understanding of the instructions. ?  ?The patient was advised to call back or seek an in-person evaluation if the symptoms worsen or if the condition fails to improve as anticipated. ? ?I provided 20 minutes of non-face-to-face time during this encounter. Patient located at their place of residence during [place] visit. Provider is in the office.  ? ? ?Debbora Presto, NP  ?

## 2021-07-08 NOTE — Telephone Encounter (Signed)
..   Pt understands that although there may be some limitations with this type of visit, we will take all precautions to reduce any security or privacy concerns.  Pt understands that this will be treated like an in office visit and we will file with pt's insurance, and there may be a patient responsible charge related to this service. ? ?

## 2021-07-08 NOTE — Patient Instructions (Addendum)
Please continue using your CPAP regularly. While your insurance requires that you use CPAP at least 4 hours each night on 70% of the nights, I recommend, that you not skip any nights and use it throughout the night if you can. Getting used to CPAP and staying with the treatment long term does take time and patience and discipline. Untreated obstructive sleep apnea when it is moderate to severe can have an adverse impact on cardiovascular health and raise her risk for heart disease, arrhythmias, hypertension, congestive heart failure, stroke and diabetes. Untreated obstructive sleep apnea causes sleep disruption, nonrestorative sleep, and sleep deprivation. This can have an impact on your day to day functioning and cause daytime sleepiness and impairment of cognitive function, memory loss, mood disturbance, and problems focussing. Using CPAP regularly can improve these symptoms. ? ?Continue topiramate '50mg'$  daily and gabapentin 300/600/900. We will start Ajovy injections every 30 days. Continue Tylenol for abortive therapy but avoid taking more than 1-2 times per week.  ? ?Follow up in 4 months  ?

## 2021-07-09 ENCOUNTER — Encounter: Payer: Self-pay | Admitting: Family Medicine

## 2021-07-09 ENCOUNTER — Telehealth (INDEPENDENT_AMBULATORY_CARE_PROVIDER_SITE_OTHER): Payer: Managed Care, Other (non HMO) | Admitting: Family Medicine

## 2021-07-09 DIAGNOSIS — G43709 Chronic migraine without aura, not intractable, without status migrainosus: Secondary | ICD-10-CM | POA: Diagnosis not present

## 2021-07-09 DIAGNOSIS — G4733 Obstructive sleep apnea (adult) (pediatric): Secondary | ICD-10-CM | POA: Diagnosis not present

## 2021-07-09 DIAGNOSIS — E0842 Diabetes mellitus due to underlying condition with diabetic polyneuropathy: Secondary | ICD-10-CM

## 2021-07-09 DIAGNOSIS — Z9989 Dependence on other enabling machines and devices: Secondary | ICD-10-CM

## 2021-07-09 MED ORDER — AJOVY 225 MG/1.5ML ~~LOC~~ SOAJ: 225.0000 mg | SUBCUTANEOUS | 3 refills | Status: DC

## 2021-07-10 ENCOUNTER — Ambulatory Visit: Payer: Managed Care, Other (non HMO) | Admitting: Family Medicine

## 2021-09-30 ENCOUNTER — Other Ambulatory Visit: Payer: Self-pay

## 2021-09-30 MED ORDER — GABAPENTIN 300 MG PO CAPS: 600.0000 mg | ORAL_CAPSULE | Freq: Three times a day (TID) | ORAL | 2 refills | Status: DC

## 2021-09-30 MED ORDER — TOPIRAMATE 25 MG PO TABS: 50.0000 mg | ORAL_TABLET | Freq: Every day | ORAL | 0 refills | Status: DC

## 2021-10-02 ENCOUNTER — Telehealth: Payer: Self-pay | Admitting: Family Medicine

## 2021-10-02 MED ORDER — GABAPENTIN 300 MG PO CAPS: 600.0000 mg | ORAL_CAPSULE | Freq: Three times a day (TID) | ORAL | 1 refills | Status: DC

## 2021-10-02 NOTE — Telephone Encounter (Signed)
Pt states the order for her gabapentin (NEURONTIN) 300 MG capsule, filled thru [facility] was for 30 days , it should have been for a 90 day.  Pt asking if this can be corrected

## 2021-10-02 NOTE — Telephone Encounter (Signed)
Refill corrected for a 90 day supply for the pt

## 2021-11-14 NOTE — Progress Notes (Unsigned)
PATIENT: Katie Harris DOB: 07-06-1975  REASON FOR VISIT: follow up HISTORY FROM: patient  Virtual Visit via Telephone Note  I connected with Katie Harris on 11/19/21 at  9:15 AM EDT by telephone and verified that I am speaking with the correct person using two identifiers.   I discussed the limitations, risks, security and privacy concerns of performing an evaluation and management service by telephone and the availability of in person appointments. I also discussed with the patient that there may be a patient responsible charge related to this service. The patient expressed understanding and agreed to proceed.   History of Present Illness:  11/19/21 ALL (mychart): Katie Harris returns for follow up for migraines. She was last seen 07/2021 and reported daily headaches with at least 5-6 migraines every month. We started her on started on Ajovy and continued topiramate and gabapentin. Since, she reports taking Ajovy for 2 months. She was experiencing constipation so she stopped injections. She reports severity of headaches may be a little better. She continues to have daily headaches. Maybe 3-4 migraines. Tylenol and rest abort migraines.   BP and CBGs have been normal. TSH has been stable. She drinks about 100 ounces of water daily. She usually sleeps well. Using CPAP daily. AHI well managed in 06/2021.   07/09/2021 ALL (mychart): Katie Harris is a 46 y.o. female here today for follow up for migraines and neuropathy (Katie Harris) and OSA on CPAP (Katie Harris). She continues topiramate '50mg'$  daily and gabapentin 300/600/900. She reports headaches are unchanged. She has a headache most every day. She is having 5-6 migrainous headaches every month. Tylenol and rest are usually effective to abort migraine. Neuropathy stable. She is using CPAP nightly for at least 4 hours. No concerns with machine or supplies.     07/09/20 ALL: She returns for follow up for migraines (Katie Harris) and OSA on CPAP (Katie Harris). She  continues topiramate '50mg'$  daily and gabapentin 300/600/900. She feels that she is doing well. She does continues to have some sort of mild headache most days. She has about 2 severe migraines per month. She will take Tylenol and rest and migraine usually resolves by the next day. She has tried abortive meds (unsure of which) in the past but did not like how they made her feel. She does not wish to add prevention meds.     She continues to do well with CPAP therapy. She is using therapy nightly. She does feel it helps with sleep. She denies concerns with machine or supplies.          Observations/Objective:  Generalized: Well developed, in no acute distress  Mentation: Alert oriented to time, place, history taking. Follows all commands speech and language fluent   Assessment and Plan:  46 y.o. year old female  has a past medical history of Anxiety, Arthritis, Back pain, Blood dyscrasia, Cancer ([place]), Depression, Diabetes mellitus without complication ([place]), Factor 5 Leiden mutation, heterozygous ([place]) (04/21/2012), Headache(784.0), HTN (hypertension), benign (04/21/2012), Hypertension, Hyperthyroidism, Hyperthyroidism (04/21/2012), Infertility, female, Leg edema, PCOS (polycystic ovarian syndrome), Peripheral vascular disease ([place]), Prediabetes, and Sleep apnea. here with    ICD-10-CM   1. Chronic migraine without aura without status migrainosus, not intractable  G43.709       Katie Harris is doing well, today. She continues to have daily headaches with at 3-4 being migrainous headaches per month. She will continue topiramate '50mg'$  daily and gabapentin 300/600/'900mg'$ . Continue Tylenol for abortive therapy.We have discussed trial of Emgality versus Qulipta. May also  consider Botox in future. She is not interested in adding additional meds at this time. Healthy lifestyle habits encouraged. She will follow up with me in 4 months, sooner if needed. She verbalizes understanding and agreement with this plan.    No orders of the defined types were placed in this encounter.   Meds ordered this encounter  Medications   topiramate (TOPAMAX) 25 MG tablet    Sig: Take 2 tablets (50 mg total) by mouth daily.    Dispense:  180 tablet    Refill:  3    Order Specific Question:   Supervising Provider    Answer:   Katie Harris [[id number]]   gabapentin (NEURONTIN) 300 MG capsule    Sig: Take 1 capsule (300 mg total) by mouth in the morning AND 2 capsules (600 mg total) daily with lunch AND 3 capsules (900 mg total) at bedtime.    Dispense:  540 capsule    Refill:  3    Order Specific Question:   Supervising Provider    Answer:   Katie Harris [id number]     Follow Up Instructions:  I discussed the assessment and treatment plan with the patient. The patient was provided an opportunity to ask questions and all were answered. The patient agreed with the plan and demonstrated an understanding of the instructions.   The patient was advised to call back or seek an in-person evaluation if the symptoms worsen or if the condition fails to improve as anticipated.  I provided 20 minutes of non-face-to-face time during this encounter. Patient located at their place of residence during [place] visit. Provider is in the office.    Katie Presto, NP

## 2021-11-14 NOTE — Patient Instructions (Signed)
Below is our plan:  We will continue topiramate '50mg'$  daily and gabapentin 300/600/'900mg'$ . We can consider adding Emgality or Qulipta in the future. May also consider Botox if needed.   Please make sure you are staying well hydrated. I recommend 50-60 ounces daily. Well balanced diet and regular exercise encouraged. Consistent sleep schedule with 6-8 hours recommended.   Please continue follow up with care team as directed.   Follow up with me in 1 year   You may receive a survey regarding today's visit. I encourage you to leave honest feed back as I do use this information to improve patient care. Thank you for seeing me today!

## 2021-11-19 ENCOUNTER — Encounter: Payer: Self-pay | Admitting: Family Medicine

## 2021-11-19 ENCOUNTER — Telehealth (INDEPENDENT_AMBULATORY_CARE_PROVIDER_SITE_OTHER): Payer: Managed Care, Other (non HMO) | Admitting: Family Medicine

## 2021-11-19 DIAGNOSIS — G43709 Chronic migraine without aura, not intractable, without status migrainosus: Secondary | ICD-10-CM | POA: Diagnosis not present

## 2021-11-19 MED ORDER — GABAPENTIN 300 MG PO CAPS
ORAL_CAPSULE | ORAL | 3 refills | Status: DC
Start: 2021-11-19 — End: 2022-03-06

## 2021-11-19 MED ORDER — TOPIRAMATE 25 MG PO TABS: 50.0000 mg | ORAL_TABLET | Freq: Every day | ORAL | 3 refills | Status: DC

## 2021-12-11 ENCOUNTER — Encounter: Payer: Self-pay | Admitting: Internal Medicine

## 2021-12-11 ENCOUNTER — Ambulatory Visit: Payer: Managed Care, Other (non HMO) | Admitting: Internal Medicine

## 2021-12-11 VITALS — BP 110/68 | HR 88 | Ht 69.0 in | Wt 323.4 lb

## 2021-12-11 DIAGNOSIS — E890 Postprocedural hypothyroidism: Secondary | ICD-10-CM

## 2021-12-11 DIAGNOSIS — E119 Type 2 diabetes mellitus without complications: Secondary | ICD-10-CM | POA: Diagnosis not present

## 2021-12-11 DIAGNOSIS — Z6841 Body Mass Index (BMI) 40.0 and over, adult: Secondary | ICD-10-CM | POA: Diagnosis not present

## 2021-12-11 LAB — POCT GLYCOSYLATED HEMOGLOBIN (HGB A1C): Hemoglobin A1C: 5.8 % — AB (ref 4.0–5.6)

## 2021-12-11 MED ORDER — METFORMIN HCL 850 MG PO TABS: ORAL_TABLET | ORAL | 3 refills | Status: DC

## 2021-12-11 MED ORDER — OZEMPIC (1 MG/DOSE) 4 MG/3ML ~~LOC~~ SOPN
1.0000 mg | PEN_INJECTOR | SUBCUTANEOUS | 3 refills | Status: DC
Start: 2021-12-11 — End: 2022-11-03

## 2021-12-11 NOTE — Progress Notes (Signed)
Patient ID: Katie Harris, female   DOB: 1975/06/05, 46 y.o.   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 Last visit 6 months ago.  Interim history: She has a history of IBS and has abdominal pain and diarrhea after higher fiber meals.  No increased urination, + intermittent L blurry vision, no nausea, no chest pain.  Reviewed history: Pt was diagnosed with thyrotoxicosis in 11/2014. At that time, she was taking several thyrotoxic supplements and also took Biotin.  Before our first visit, she was on biotin, B6 vitamin, [place], but was previously on [place], [employer] - stopped 3-4 weeks prior to the appt. I advised her to stay off all the supplements and hold the Biotin few days before next TFTs.  Her TSI antibodies were not elevated.   TFTs continued to fluctuate but she continued to have suppressed TSH and elevated free T3.  Thyroid Uptake and scan which she had on 10/25/2015: Elevated 24 hour radio iodine uptake of 42%.  Large hot nodule within LEFT thyroid lobe with suppression of uptake within the RIGHT lobe consistent with a toxic adenoma.   11/09/2015: RAI treatment  She developed post ablative hypothyroidism afterwards.  We started on levothyroxine 50 mcg daily and increased the dose to 75 mcg daily 05/2018.  She takes this: - in am - fasting - at least 30 min from b'fast - no Ca, Fe, PPIs - + MVIs in the pm - prev. on Biotin in Hair Skin and Nails  Reviewed her TFTs: Lab Results  Component Value Date   TSH 1.54 06/11/2021   TSH 1.26 12/17/2020   TSH 1.50 06/08/2020   TSH 1.96 11/21/2019   TSH 2.57 01/14/2019   TSH 3.71 05/10/2018   TSH 3.35 12/22/2017   TSH 6.830 (H) 09/08/2017   TSH 4.780 (H) 04/23/2017   TSH 4.260 01/06/2017   FREET4 0.77 06/11/2021    FREET4 0.79 12/17/2020   FREET4 0.81 06/08/2020   FREET4 0.68 11/21/2019   FREET4 1.0 01/14/2019   FREET4 0.95 05/10/2018   FREET4 0.84 12/22/2017   FREET4 0.90 09/08/2017   FREET4 0.95 04/23/2017   FREET4 0.72 07/29/2016   T3FREE 3.3 07/29/2016   T3FREE 2.7 03/31/2016   T3FREE 3.8 12/21/2015   T3FREE 4.5 (H) 10/15/2015   T3FREE 5.0 (H) 08/31/2015   T3FREE 5.2 (H) 07/19/2015  11/07/2014: TSH <0.01, fT4 1.54 (0.61-1.18)  TSI's were not elevated: Lab Results  Component Value Date   TSI <89 07/19/2015   Pt denies: - feeling nodules in neck - hoarseness - dysphagia - choking  Pt does have a FH of thyroid ds: hypothyroidism in mother, possibly MGM. No FH of thyroid cancer. No h/o radiation tx to head or neck other than RAI treatment. No herbal supplements. No recent steroids use.   She also has a history of OSA, PCOS, and pseudotumor cerebri.  DM2: - dx'ed 2019  She is currently on: - Metformin 500 mg >> 850 mg 2x a day   - Ozempic 0.5 mg weekly - started 01/2019 - had mild nausea >> resolved >> 1 mg weekly  She is checking sugars 0 to 1x a day - am: 122-147, 150 >> 120-135, 140 >> 118-131,161 - 2h after b'fast: 109, 111 >> n/c >> 118, 138 >> 183, 203 - lunch:  116 >> n/c >> 134 >>  n/c >> 112 >> 105-140 - 2h after lunch: 94-140, 157, 158 >> 100-130 >>  110-144 - dinner: 95-121 >> n/c >> 119, 128, 133 >> n/c >> 96, 110 - 2h after dinner: 204 >> n/c >> 128 >> n/c - bedtime: 182 >> n/c Lowest: 89 >> 94 >> 98 >> 96. Highest: 145 >> 158 >> 200 x1 >> 203 after protein shake..  Reviewed HbA1c levels: Lab Results  Component Value Date   HGBA1C 5.7 (A) 06/11/2021   HGBA1C 5.8 (A) 12/11/2020   HGBA1C 5.7 (A) 06/08/2020   HGBA1C 5.5 11/21/2019   HGBA1C 5.8 (A) 04/21/2019   HGBA1C 7.9 (A) 01/14/2019   HGBA1C 6.6 (A) 05/10/2018   HGBA1C 5.9 (H) 09/08/2017   HGBA1C 7.4 (H) 04/23/2017   Normal kidney function: Lab Results  Component Value Date   BUN 9 06/11/2021   Lab  Results  Component Value Date   CREATININE 0.68 06/11/2021   CREATININE 0.62 06/09/2019   CREATININE 0.70 04/21/2019   CREATININE 0.78 09/08/2017   CREATININE 0.71 04/23/2017   CREATININE 0.68 01/06/2017   CREATININE 0.62 10/09/2016   CREATININE 0.65 07/09/2016   CREATININE 0.62 03/22/2012   + HL: Lab Results  Component Value Date   CHOL 231 (H) 06/11/2021   HDL 42.60 06/11/2021   LDLCALC 159 (H) 06/11/2021   LDLDIRECT 143.0 04/21/2019   TRIG 148.0 06/11/2021   CHOLHDL 5 06/11/2021  She was found to have fatty liver on abdominal ultrasound from 09/28/2020. I suggested a statin at last visit: Pravastatin 20 mg daily - she started.  Last eye exam was in 2023: No DR reportedly.  Last foot exam 06/2021.  She is on alpha-lipoic acid and Neurontin.  ROS:  + see HPI  I reviewed pt's medications, allergies, PMH, social hx, family hx, and changes were documented in the history of present illness. Otherwise, unchanged from my initial visit note.  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    Cancer ([place])    basal cell removed from left hairline area   Depression    Diabetes mellitus without complication (HCC)    recent A1C 6.5 no meds presently   Factor 5 Leiden mutation, heterozygous ([place]) 04/21/2012   Lab 04/16/12 in view of positive Leiden in her father; no personal hx of thrombosis despite prior pregnancy & C-section 2007   Headache(784.0)    chronic due to pseudo tumor cerebri   HTN (hypertension), benign 04/21/2012   Hypertension    Hyperthyroidism    levels were abnormal in Dec- no treatment currently   Hyperthyroidism 04/21/2012   Infertility, female    Leg edema    PCOS (polycystic ovarian syndrome)    Peripheral vascular disease ([place])    neuropathy bilateral feet   Prediabetes    Sleep apnea    Past Surgical History:  Procedure Laterality Date   CESAREAN SECTION  2007    DILATATION & CURETTAGE/HYSTEROSCOPY WITH MYOSURE N/A 10/17/2016   Procedure: DILATATION & CURETTAGE/HYSTEROSCOPY WITH MYOSURE POLYPECTOMY;  Surgeon: Paula Compton, MD;  Location: [facility];  Service: Gynecology;  Laterality: N/A;  myosure rep will be here confirmed on 10/06/16   DILATION AND CURETTAGE OF UTERUS     polpectomy   INTRAUTERINE DEVICE (IUD) INSERTION N/A 10/17/2016   Procedure: INTRAUTERINE DEVICE (IUD) INSERTION MIRENA;  Surgeon: Paula Compton, MD;  Location: [facility];  Service: Gynecology;  Laterality: N/A;   TONSILLECTOMY     Social History   Social History   Marital Status: Married    Spouse Name: N/A   Number of Children: 1   Occupational History   Secretary/administrator   Social History Main Topics   Smoking status: Never Smoker    Smokeless tobacco: Not on file   Alcohol Use: No   Drug Use: No   Current Outpatient Medications on File Prior to Visit  Medication Sig Dispense Refill   Accu-Chek FastClix Lancets MISC Use to check blood sugar once a day 100 each 3   ACCU-CHEK GUIDE test strip USE TO CHECK BLOOD GLUCOSE ONCE DAILY AS DIRECTED 100 strip 11   Alpha-Lipoic Acid 600 MG CAPS Take 600 mg by mouth daily.     Blood Glucose Monitoring Suppl (ACCU-CHEK GUIDE) w/Device KIT 1 kit by Does not apply route 2 (two) times daily. 1 kit 0   cyclobenzaprine (FLEXERIL) 10 MG tablet at bedtime.   3   DULoxetine (CYMBALTA) 60 MG capsule Take 60 mg by mouth daily.   6   gabapentin (NEURONTIN) 300 MG capsule Take 1 capsule (300 mg total) by mouth in the morning AND 2 capsules (600 mg total) daily with lunch AND 3 capsules (900 mg total) at bedtime. 540 capsule 3   hyoscyamine (ANASPAZ) 0.125 MG TBDP disintergrating tablet Place 0.125 mg under the tongue 4 (four) times daily -  before meals and at bedtime.      levothyroxine (SYNTHROID) 75 MCG tablet TAKE 1 TABLET(75 MCG) BY MOUTH DAILY BEFORE BREAKFAST 90 tablet 3   metFORMIN (GLUCOPHAGE) 850 MG tablet TAKE 1 TABLET(850 MG) BY  MOUTH TWICE DAILY WITH A MEAL 60 tablet 0   metoprolol (LOPRESSOR) 50 MG tablet Take 50 mg by mouth 2 (two) times daily.      Multiple Vitamins-Minerals (ALIVE MULTI-VITAMIN PO) Take by mouth.     pravastatin (PRAVACHOL) 20 MG tablet Take 1 tablet (20 mg total) by mouth daily. 90 tablet 3   Semaglutide, 1 MG/DOSE, (OZEMPIC, 1 MG/DOSE,) 4 MG/3ML SOPN Inject 1 mg into the skin once a week. 3 mL 0   topiramate (TOPAMAX) 25 MG tablet Take 2 tablets (50 mg total) by mouth daily. 180 tablet 3   VITAMIN D PO Take by mouth daily.     No current facility-administered medications on file prior to visit.   No Known Allergies Family History  Problem Relation Age of Onset   Hypertension Mother    Hyperlipidemia Mother    Thyroid disease Mother    Depression Mother    Anxiety disorder Mother    Sleep apnea Mother    Obesity Mother    Kidney cancer Mother 51   Heart disease Father    Diabetes Father    Hypertension Father    Hyperlipidemia Father    Obesity Father    Diabetes Maternal Grandmother    Hyperlipidemia Maternal Grandmother    Hyperlipidemia Maternal Grandfather    Hyperlipidemia Paternal Grandmother    Diabetes Paternal Grandmother    Cancer Paternal Grandmother    Heart disease Paternal Grandfather     PE: BP 110/68 (BP Location: Left Arm, Patient Position: Sitting, Cuff Size: Normal)   Pulse 88   Ht $R'5\' 9"'TC$  (1.753 m)   Wt (!) 323 lb 6.4 oz (146.7 kg)   SpO2 98%   BMI 47.76 kg/m  Wt Readings from Last 3 Encounters:  12/11/21 (!) 323 lb 6.4 oz (  146.7 kg)  06/11/21 (!) 323 lb (146.5 kg)  12/11/20 (!) 345 lb 12.8 oz (156.9 kg)   Constitutional: overweight, in NAD Eyes:  EOMI, no exophthalmos ENT: no neck masses, no cervical lymphadenopathy Cardiovascular: RRR, No MRG Respiratory: CTA B Musculoskeletal: no deformities Skin:no rashes Neurological: no tremor with outstretched hands  ASSESSMENT: 1.  Post ablative hypothyroidism -After RAI treatment for toxic  adenoma  2. DM2, non-insulin-dependent,  with complications -Peripheral neuropathy  3.  Obesity class III  PLAN:  1. Patient with history of thyrotoxicosis with a diagnosis of left toxic adenoma, now status post RAI treatment in 11/2015, after which she developed post ablative hypothyroidism - latest thyroid labs reviewed with pt. >> normal: Lab Results  Component Value Date   TSH 1.54 06/11/2021  - she continues on LT4 75 mcg daily - pt feels good on this dose, without complaints. - we discussed about taking the thyroid hormone every day, with water, >30 minutes before breakfast, separated by >4 hours from acid reflux medications, calcium, iron, multivitamins. Pt. is taking it correctly. - will check thyroid tests at next visit  3. DM2 -Patient with well-controlled diabetes, on metformin and weekly GLP-1 receptor agonist.  At last visit, she continued on 1 mg of Ozempic weekly, tolerated well.  HbA1c was excellent, at 5.7%, decreased.  We did not change her regimen. -At today's visit, sugars remain at target.  She occasionally has slightly higher blood sugars between meals, possibly due to proximity of the previous meals.  No need to change her regimen for now. -I advised her to: Patient Instructions  Please continue: - Metformin 850 mg 2x a day - Ozempic 1 mg weekly  Please continue: - levothyroxine 75 mcg daily.  Take the thyroid hormone every day, with water, at least 30 minutes before breakfast, separated by at least 4 hours from: - acid reflux medications - calcium - iron - multivitamins  Please return in 6 months with your sugar log.   - we checked her HbA1c: 5.8% (stable) - advised to check sugars at different times of the day - 1x a day, rotating check times - advised for yearly eye exams >> she is UTD - refilled her diabetic medications - she [place] testing [place] - return to clinic in 6 months  3.  Obesity class III -We will continue Ozempic which  should also help with weight loss -She is also on Topamax for headaches and this can also help with weight loss -A at last visit, she lost 21 pounds, previously lost another approximately 24 pounds -At today's visit, weight is stable  Philemon Kingdom, MD PhD [facility]

## 2021-12-11 NOTE — Patient Instructions (Signed)
Please continue: - Metformin 850 mg 2x a day - Ozempic 1 mg weekly  Please continue: - levothyroxine 75 mcg daily.  Take the thyroid hormone every day, with water, at least 30 minutes before breakfast, separated by at least 4 hours from: - acid reflux medications - calcium - iron - multivitamins  Please return in 6 months with your sugar log.

## 2021-12-17 ENCOUNTER — Telehealth: Payer: Self-pay | Admitting: Family Medicine

## 2021-12-17 MED ORDER — TOPIRAMATE 25 MG PO TABS: 50.0000 mg | ORAL_TABLET | Freq: Every day | ORAL | 3 refills | Status: DC

## 2021-12-17 NOTE — Telephone Encounter (Signed)
Pt asking if refill for ttopiramate (TOPAMAX) 25 MG tablet be sent to [facility]. Would like a call from the nurse to confirm prescription has been sent.

## 2021-12-17 NOTE — Telephone Encounter (Signed)
Called and spoke w/ pt. Advised rx sent to [facility] for her. She verbalized understanding.

## 2022-03-06 ENCOUNTER — Telehealth: Payer: Self-pay | Admitting: Family Medicine

## 2022-03-06 ENCOUNTER — Other Ambulatory Visit: Payer: Self-pay | Admitting: Internal Medicine

## 2022-03-06 ENCOUNTER — Other Ambulatory Visit: Payer: Self-pay | Admitting: Neurology

## 2022-03-06 MED ORDER — GABAPENTIN 300 MG PO CAPS: ORAL_CAPSULE | ORAL | 2 refills | Status: DC

## 2022-03-06 NOTE — Telephone Encounter (Signed)
Pt request refill for gabapentin (NEURONTIN) 300 MG capsule at [facility], [place]

## 2022-03-06 NOTE — Telephone Encounter (Signed)
Refill sent for the patient to the pharmacy as requested

## 2022-05-26 ENCOUNTER — Other Ambulatory Visit (HOSPITAL_COMMUNITY): Payer: Self-pay

## 2022-05-29 ENCOUNTER — Other Ambulatory Visit (HOSPITAL_COMMUNITY): Payer: Self-pay

## 2022-06-02 ENCOUNTER — Telehealth: Payer: Self-pay

## 2022-06-02 NOTE — Telephone Encounter (Signed)
Inbound letter requesting information for Ozempic prior auth. Call back number [phone].

## 2022-06-13 ENCOUNTER — Ambulatory Visit: Payer: Managed Care, Other (non HMO) | Admitting: Internal Medicine

## 2022-06-24 ENCOUNTER — Other Ambulatory Visit: Payer: Self-pay | Admitting: Internal Medicine

## 2022-07-22 ENCOUNTER — Encounter: Payer: Self-pay | Admitting: Internal Medicine

## 2022-07-22 ENCOUNTER — Ambulatory Visit: Payer: Managed Care, Other (non HMO) | Admitting: Internal Medicine

## 2022-07-22 VITALS — BP 138/88 | HR 78 | Ht 69.0 in | Wt 320.6 lb

## 2022-07-22 DIAGNOSIS — Z7985 Long-term (current) use of injectable non-insulin antidiabetic drugs: Secondary | ICD-10-CM

## 2022-07-22 DIAGNOSIS — Z7984 Long term (current) use of oral hypoglycemic drugs: Secondary | ICD-10-CM

## 2022-07-22 DIAGNOSIS — E890 Postprocedural hypothyroidism: Secondary | ICD-10-CM

## 2022-07-22 DIAGNOSIS — E119 Type 2 diabetes mellitus without complications: Secondary | ICD-10-CM

## 2022-07-22 DIAGNOSIS — Z6841 Body Mass Index (BMI) 40.0 and over, adult: Secondary | ICD-10-CM

## 2022-07-22 LAB — POCT GLYCOSYLATED HEMOGLOBIN (HGB A1C): Hemoglobin A1C: 5.8 % — AB (ref 4.0–5.6)

## 2022-07-22 MED ORDER — ONETOUCH ULTRASOFT LANCETS MISC
3 refills | Status: AC
Start: 2022-07-22 — End: ?

## 2022-07-22 MED ORDER — ONETOUCH VERIO VI STRP
ORAL_STRIP | 3 refills | Status: DC
Start: 2022-07-22 — End: 2022-08-05

## 2022-07-22 MED ORDER — ONETOUCH VERIO W/DEVICE KIT
PACK | 0 refills | Status: DC
Start: 2022-07-22 — End: 2022-08-05

## 2022-07-22 NOTE — Patient Instructions (Signed)
Please continue: - Metformin 850 mg 2x a day - Ozempic 1 mg weekly  Please continue: - levothyroxine 75 mcg daily.  Take the thyroid hormone every day, with water, at least 30 minutes before breakfast, separated by at least 4 hours from: - acid reflux medications - calcium - iron - multivitamins  Stop Biotin and come back for labs in 1 week.  Please return in 6 months with your sugar log.

## 2022-07-22 NOTE — Progress Notes (Signed)
Patient ID: JEWELDEAN FURMANSKI, female   DOB: May 26, 1975, 47 y.o.   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 Last visit 6 months ago.  Interim history: No increased urination, chest pain, nausea.  She had knee surgery 06/12/2022 >> recovered well.  Reviewed history: Pt was diagnosed with thyrotoxicosis in 11/2014. At that time, she was taking several thyrotoxic supplements and also took Biotin.  Before our first visit, she was on biotin, B6 vitamin, [place]ia, but was previously on [place], [employer] - stopped 3-4 weeks prior to the appt. I advised her to stay off all the supplements and hold the Biotin few days before next TFTs.  Her TSI antibodies were not elevated.   TFTs continued to fluctuate but she continued to have suppressed TSH and elevated free T3.  Thyroid Uptake and scan which she had on 10/25/2015: Elevated 24 hour radio iodine uptake of 42%.  Large hot nodule within LEFT thyroid lobe with suppression of uptake within the RIGHT lobe consistent with a toxic adenoma.   11/09/2015: RAI treatment  She developed post ablative hypothyroidism afterwards.  We started on levothyroxine 50 mcg daily and increased the dose to 75 mcg daily 05/2018.  She takes this: - in am - fasting - at least 30 min from b'fast - no Ca, Fe, PPIs - + MVIs in the pm - on Biotin in Hair Skin and Nails >> forgot to stop before today's visit  Reviewed her TFTs: Lab Results  Component Value Date   TSH 1.54 06/11/2021   TSH 1.26 12/17/2020   TSH 1.50 06/08/2020   TSH 1.96 11/21/2019   TSH 2.57 01/14/2019   TSH 3.71 05/10/2018   TSH 3.35 12/22/2017   TSH 6.830 (H) 09/08/2017   TSH 4.780 (H) 04/23/2017   TSH 4.260 01/06/2017   FREET4 0.77 06/11/2021   FREET4 0.79 12/17/2020   FREET4 0.81  06/08/2020   FREET4 0.68 11/21/2019   FREET4 1.0 01/14/2019   FREET4 0.95 05/10/2018   FREET4 0.84 12/22/2017   FREET4 0.90 09/08/2017   FREET4 0.95 04/23/2017   FREET4 0.72 07/29/2016   T3FREE 3.3 07/29/2016   T3FREE 2.7 03/31/2016   T3FREE 3.8 12/21/2015   T3FREE 4.5 (H) 10/15/2015   T3FREE 5.0 (H) 08/31/2015   T3FREE 5.2 (H) 07/19/2015  11/07/2014: TSH <0.01, fT4 1.54 (0.61-1.18)  TSI's were not elevated: Lab Results  Component Value Date   TSI <89 07/19/2015   Pt denies: - feeling nodules in neck - hoarseness - dysphagia - choking  Pt does have a FH of thyroid ds: hypothyroidism in mother, possibly MGM. No FH of thyroid cancer. No h/o radiation tx to head or neck other than RAI treatment. No herbal supplements. No recent steroids use.   She also has a history of OSA, PCOS, and pseudotumor cerebri. She has a history of IBS and has abdominal pain and diarrhea after higher fiber meals.   DM2: - dx'ed 2019  She is currently on: - Metformin 500 mg >> 850 mg 2x a day   - Ozempic 0.5 mg weekly - started 01/2019 - had mild nausea >> resolved >> 1 mg weekly  She was checking sugars 0 to 1x a day >> stopped checking as she was out of test strips and lancets. From before: - am: 122-147, 150 >> 120-135, 140 >> 118-131,161 -  2h after b'fast: 109, 111 >> n/c >> 118, 138 >> 183, 203 - lunch:  116 >> n/c >> 134 >> n/c >> 112 >> 105-140 - 2h after lunch: 94-140, 157, 158 >> 100-130 >>  110-144 - dinner: 95-121 >> n/c >> 119, 128, 133 >> n/c >> 96, 110 - 2h after dinner: 204 >> n/c >> 128 >> n/c - bedtime: 182 >> n/c Lowest: 89 >> 94 >> 98 >> 96. Highest: 145 >> 158 >> 200 x1 >> 203 after protein shake.  Reviewed HbA1c levels: Lab Results  Component Value Date   HGBA1C 5.8 (A) 12/11/2021   HGBA1C 5.7 (A) 06/11/2021   HGBA1C 5.8 (A) 12/11/2020   HGBA1C 5.7 (A) 06/08/2020   HGBA1C 5.5 11/21/2019   HGBA1C 5.8 (A) 04/21/2019   HGBA1C 7.9 (A) 01/14/2019   HGBA1C 6.6 (A)  05/10/2018   HGBA1C 5.9 (H) 09/08/2017   HGBA1C 7.4 (H) 04/23/2017   Normal kidney function: Lab Results  Component Value Date   BUN 9 06/11/2021   Lab Results  Component Value Date   CREATININE 0.68 06/11/2021   CREATININE 0.62 06/09/2019   CREATININE 0.70 04/21/2019   CREATININE 0.78 09/08/2017   CREATININE 0.71 04/23/2017   CREATININE 0.68 01/06/2017   CREATININE 0.62 10/09/2016   CREATININE 0.65 07/09/2016   CREATININE 0.62 03/22/2012   + HL: Lab Results  Component Value Date   CHOL 231 (H) 06/11/2021   HDL 42.60 06/11/2021   LDLCALC 159 (H) 06/11/2021   LDLDIRECT 143.0 04/21/2019   TRIG 148.0 06/11/2021   CHOLHDL 5 06/11/2021  She was found to have fatty liver on abdominal ultrasound from 09/28/2020. She is on pravastatin 20 mg daily.  Last eye exam was in 2023: No DR reportedly.  Last foot exam 06/2021.  She is on alpha-lipoic acid and Neurontin.  ROS:  + see HPI  I reviewed pt's medications, allergies, PMH, social hx, family hx, and changes were documented in the history of present illness. Otherwise, unchanged from my initial visit note.  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    Cancer (HCC)    basal cell removed from left hairline area   Depression    Diabetes mellitus without complication (HCC)    recent A1C 6.5 no meds presently   Factor 5 Leiden mutation, heterozygous (HCC) 04/21/2012   Lab 04/16/12 in view of positive Leiden in her father; no personal hx of thrombosis despite prior pregnancy & C-section 2007   Headache(784.0)    chronic due to pseudo tumor cerebri   HTN (hypertension), benign 04/21/2012   Hypertension    Hyperthyroidism    levels were abnormal in Dec- no treatment currently   Hyperthyroidism 04/21/2012   Infertility, female    Leg edema    PCOS (polycystic ovarian syndrome)    Peripheral vascular disease (HCC)    neuropathy bilateral  feet   Prediabetes    Sleep apnea    Past Surgical History:  Procedure Laterality Date   CESAREAN SECTION  2007   DILATATION & CURETTAGE/HYSTEROSCOPY WITH MYOSURE N/A 10/17/2016   Procedure: DILATATION & CURETTAGE/HYSTEROSCOPY WITH MYOSURE POLYPECTOMY;  Surgeon: Huel Cote, MD;  Location: [facility];  Service: Gynecology;  Laterality: N/A;  myosure rep will be here confirmed on 10/06/16   DILATION AND CURETTAGE OF UTERUS     polpectomy   INTRAUTERINE  DEVICE (IUD) INSERTION N/A 10/17/2016   Procedure: INTRAUTERINE DEVICE (IUD) INSERTION MIRENA;  Surgeon: Huel Cote, MD;  Location: [facility];  Service: Gynecology;  Laterality: N/A;   TONSILLECTOMY     Social History   Social History   Marital Status: Married    Spouse Name: N/A   Number of Children: 1   Occupational History   Land   Social History Main Topics   Smoking status: Never Smoker    Smokeless tobacco: Not on file   Alcohol Use: No   Drug Use: No   Current Outpatient Medications on File Prior to Visit  Medication Sig Dispense Refill   Accu-Chek FastClix Lancets MISC Use to check blood sugar once a day 100 each 3   ACCU-CHEK GUIDE test strip USE TO CHECK BLOOD GLUCOSE ONCE DAILY AS DIRECTED 100 strip 11   Alpha-Lipoic Acid 600 MG CAPS Take 600 mg by mouth daily.     Blood Glucose Monitoring Suppl (ACCU-CHEK GUIDE) w/Device KIT 1 kit by Does not apply route 2 (two) times daily. 1 kit 0   cyclobenzaprine (FLEXERIL) 10 MG tablet at bedtime.   3   DULoxetine (CYMBALTA) 60 MG capsule Take 60 mg by mouth daily.   6   gabapentin (NEURONTIN) 300 MG capsule Take 1 capsule (300 mg total) by mouth in the morning AND 2 capsules (600 mg total) daily with lunch AND 3 capsules (900 mg total) at bedtime. 540 capsule 2   hyoscyamine (ANASPAZ) 0.125 MG TBDP disintergrating tablet Place 0.125 mg under the tongue 4 (four) times daily -  before meals and at bedtime.      levothyroxine (SYNTHROID) 75 MCG tablet TAKE 1  TABLET DAILY BEFORE BREAKFAST 90 tablet 1   Meloxicam 10 MG CAPS Take 1 tablet by mouth daily.     metFORMIN (GLUCOPHAGE) 850 MG tablet TAKE 1 TABLET(850 MG) BY MOUTH TWICE DAILY WITH A MEAL 180 tablet 3   metoprolol (LOPRESSOR) 50 MG tablet Take 50 mg by mouth 2 (two) times daily.      Multiple Vitamins-Minerals (ALIVE MULTI-VITAMIN PO) Take by mouth.     pravastatin (PRAVACHOL) 20 MG tablet TAKE 1 TABLET(20 MG) BY MOUTH DAILY 90 tablet 1   Semaglutide, 1 MG/DOSE, (OZEMPIC, 1 MG/DOSE,) 4 MG/3ML SOPN Inject 1 mg into the skin once a week. 9 mL 3   topiramate (TOPAMAX) 25 MG tablet Take 2 tablets (50 mg total) by mouth daily. 180 tablet 3   VITAMIN D PO Take by mouth daily.     No current facility-administered medications on file prior to visit.   No Known Allergies Family History  Problem Relation Age of Onset   Hypertension Mother    Hyperlipidemia Mother    Thyroid disease Mother    Depression Mother    Anxiety disorder Mother    Sleep apnea Mother    Obesity Mother    Kidney cancer Mother 60   Heart disease Father    Diabetes Father    Hypertension Father    Hyperlipidemia Father    Obesity Father    Diabetes Maternal Grandmother    Hyperlipidemia Maternal Grandmother    Hyperlipidemia Maternal Grandfather    Hyperlipidemia Paternal Grandmother    Diabetes Paternal Grandmother    Cancer Paternal Grandmother    Heart disease Paternal Grandfather    PE: BP 138/88 (BP Location: Right Arm, Patient Position: Sitting, Cuff Size: Normal)   Pulse 78   Ht 5\' 9"  (1.753 m)   Wt Marland Kitchen)  320 lb 9.6 oz (145.4 kg)   SpO2 98%   BMI 47.34 kg/m  Wt Readings from Last 3 Encounters:  07/22/22 (!) 320 lb 9.6 oz (145.4 kg)  12/11/21 (!) 323 lb 6.4 oz (146.7 kg)  06/11/21 (!) 323 lb (146.5 kg)   Constitutional: overweight, in NAD Eyes:  EOMI, no exophthalmos ENT: no neck masses, no cervical lymphadenopathy Cardiovascular: RRR, No MRG Respiratory: CTA B Musculoskeletal: no  deformities Skin:no rashes Neurological: no tremor with outstretched hands Diabetic Foot Exam - Simple   Simple Foot Form Diabetic Foot exam was performed with the following findings: Yes 07/22/2022  4:17 PM  Visual Inspection No deformities, no ulcerations, no other skin breakdown bilaterally: Yes Sensation Testing See comments: Yes Pulse Check Posterior Tibialis and Dorsalis pulse intact bilaterally: Yes Comments Decreased sensation to monofilament in B toes    ASSESSMENT: 1.  Post ablative hypothyroidism -After RAI treatment for toxic adenoma  2. DM2, non-insulin-dependent,  with complications -Peripheral neuropathy  3.  Obesity class III  PLAN:  1. Patient with history of thyrotoxicosis with a diagnosis of left toxic adenoma, now status post RAI treatment in 11/2015, after which she developed post ablative hypothyroidism - latest thyroid labs reviewed with pt. >> normal: Lab Results  Component Value Date   TSH 1.54 06/11/2021  - she continues on LT4 75 mcg daily - pt feels good on this dose. - we discussed about taking the thyroid hormone every day, with water, >30 minutes before breakfast, separated by >4 hours from acid reflux medications, calcium, iron, multivitamins. Pt. is taking it correctly. - will check thyroid tests after she has been off biotin for 1 week: TSH and fT4 - If labs are abnormal, she will need to return for repeat TFTs in 1.5 months  3. DM2 -Patient with well-controlled diabetes on metformin and weekly GLP-1 receptor agonist.  At last visit, HbA1c was excellent, at 5.8%.  We did not change her regimen.  Sugars were at target, with slightly higher values between meals, and possibly due to proximity of the previous meals. -At today's visit, she is not checking sugars at all, after she ran out of the strips and lancets.  She would be interested in getting a meter that can allow her to enter comments so we called in a One Touch Verio Reflect glucometer to  her pharmacy along with supplies. -For now, in the light of the stable HbA1c (see below), we can continue the same regimen) -I advised her to: Patient Instructions  Please continue: - Metformin 850 mg 2x a day - Ozempic 1 mg weekly  Please continue: - levothyroxine 75 mcg daily.  Take the thyroid hormone every day, with water, at least 30 minutes before breakfast, separated by at least 4 hours from: - acid reflux medications - calcium - iron - multivitamins  Stop Biotin and come back for labs in 1 week.  Please return in 6 months with your sugar log.    - we checked her HbA1c: 5.8% (stable) - advised to check sugars at different times of the day - 1x a day, rotating check times - advised for yearly eye exams >> she is due - we we will check annual labs in 1 week when she comes back for the thyroid tests - return to clinic in 6 months  3.  Obesity class III -We will continue Ozempic which should also help with weight loss -She continues on Topamax for headaches and this can also help with weight loss -  Weight was stable at last visit, previously lost 45 pounds before the previous 2 visits combined -At today's visit, she lost 3 pounds since last visit.  Orders Placed This Encounter  Procedures   TSH   T4, free   Comprehensive metabolic panel   Lipid panel   Microalbumin / creatinine urine ratio   POCT glycosylated hemoglobin (Hb A1C)   Needs refills.  Carlus Pavlov, MD PhD [facility]

## 2022-07-29 ENCOUNTER — Other Ambulatory Visit: Payer: Managed Care, Other (non HMO)

## 2022-08-05 ENCOUNTER — Encounter: Payer: Self-pay | Admitting: Internal Medicine

## 2022-08-05 DIAGNOSIS — E119 Type 2 diabetes mellitus without complications: Secondary | ICD-10-CM

## 2022-08-05 MED ORDER — ONETOUCH VERIO VI STRP
ORAL_STRIP | 3 refills | Status: DC
Start: 2022-08-05 — End: 2023-07-21

## 2022-08-05 MED ORDER — ONETOUCH VERIO W/DEVICE KIT: PACK | 0 refills | Status: AC

## 2022-08-22 ENCOUNTER — Other Ambulatory Visit: Payer: Managed Care, Other (non HMO)

## 2022-08-22 DIAGNOSIS — E119 Type 2 diabetes mellitus without complications: Secondary | ICD-10-CM

## 2022-08-22 DIAGNOSIS — E890 Postprocedural hypothyroidism: Secondary | ICD-10-CM

## 2022-08-22 NOTE — Addendum Note (Signed)
Addended by: Ronique Simerly C on: 08/22/2022 02:46 PM   Modules accepted: Orders  

## 2022-08-22 NOTE — Addendum Note (Signed)
Addended by: Ilda Foil on: 08/22/2022 02:47 PM   Modules accepted: Orders

## 2022-08-22 NOTE — Addendum Note (Signed)
Addended by: Ilda Foil on: 08/22/2022 02:46 PM   Modules accepted: Orders

## 2022-08-22 NOTE — Addendum Note (Signed)
Addended by: Jakobi Thetford C on: 08/22/2022 02:46 PM   Modules accepted: Orders  

## 2022-08-22 NOTE — Addendum Note (Signed)
Addended by: Aundre Hietala C on: 08/22/2022 02:46 PM   Modules accepted: Orders  

## 2022-08-23 LAB — TSH: TSH: 1.57 mIU/L

## 2022-08-23 LAB — COMPREHENSIVE METABOLIC PANEL
AG Ratio: 1.6 (calc) (ref 1.0–2.5)
ALT: 18 U/L (ref 6–29)
AST: 17 U/L (ref 10–35)
Albumin: 4.1 g/dL (ref 3.6–5.1)
Alkaline phosphatase (APISO): 59 U/L (ref 31–125)
BUN: 10 mg/dL (ref 7–25)
CO2: 25 mmol/L (ref 20–32)
Calcium: 9.3 mg/dL (ref 8.6–10.2)
Chloride: 105 mmol/L (ref 98–110)
Creat: 0.62 mg/dL (ref 0.50–0.99)
Globulin: 2.5 g/dL (calc) (ref 1.9–3.7)
Glucose, Bld: 89 mg/dL (ref 65–99)
Potassium: 4.3 mmol/L (ref 3.5–5.3)
Sodium: 139 mmol/L (ref 135–146)
Total Bilirubin: 0.4 mg/dL (ref 0.2–1.2)
Total Protein: 6.6 g/dL (ref 6.1–8.1)

## 2022-08-23 LAB — MICROALBUMIN / CREATININE URINE RATIO
Creatinine, Urine: 37 mg/dL (ref 20–275)
Microalb, Ur: <0.2 mg/dL

## 2022-08-23 LAB — LIPID PANEL
Cholesterol: 177 mg/dL (ref <200)
HDL: 42 mg/dL — ABNORMAL LOW (ref >=50)
LDL Cholesterol (Calc): 108 mg/dL (calc) — ABNORMAL HIGH
Non-HDL Cholesterol (Calc): 135 mg/dL (calc) — ABNORMAL HIGH (ref <130)
Total CHOL/HDL Ratio: 4.2 (calc) (ref <5.0)
Triglycerides: 157 mg/dL — ABNORMAL HIGH (ref <150)

## 2022-08-23 LAB — T4, FREE: Free T4: 0.9 ng/dL (ref 0.8–1.8)

## 2022-09-02 ENCOUNTER — Other Ambulatory Visit: Payer: Self-pay | Admitting: Internal Medicine

## 2022-09-09 ENCOUNTER — Telehealth: Payer: Self-pay

## 2022-09-09 ENCOUNTER — Other Ambulatory Visit (HOSPITAL_COMMUNITY): Payer: Self-pay

## 2022-09-09 NOTE — Telephone Encounter (Signed)
Pharmacy Patient Advocate Encounter  Prior Authorization for Ozempic has been APPROVED by [employer] from 09/09/22 to 09/09/23.   PA # [id number]

## 2022-11-03 ENCOUNTER — Other Ambulatory Visit: Payer: Self-pay | Admitting: Internal Medicine

## 2022-12-01 ENCOUNTER — Other Ambulatory Visit: Payer: Self-pay | Admitting: Internal Medicine

## 2022-12-29 ENCOUNTER — Telehealth: Payer: Self-pay | Admitting: Family Medicine

## 2022-12-29 MED ORDER — TOPIRAMATE 25 MG PO TABS: 50.0000 mg | ORAL_TABLET | Freq: Every day | ORAL | 1 refills | Status: DC

## 2022-12-29 NOTE — Telephone Encounter (Signed)
Last seen on 11/19/21 Follow up scheduled on 07/20/23

## 2022-12-29 NOTE — Telephone Encounter (Signed)
Pt has scheduled her 1 yr f/u, she is on wait list , she is now asking for a refill on her topiramate (TOPAMAX) 25 MG tablet   to [facility]

## 2023-01-05 ENCOUNTER — Telehealth: Payer: Self-pay | Admitting: Family Medicine

## 2023-01-05 MED ORDER — GABAPENTIN 300 MG PO CAPS: ORAL_CAPSULE | ORAL | 1 refills | Status: DC

## 2023-01-05 NOTE — Telephone Encounter (Signed)
Last seen 11/19/21 and next f/u 07/20/23.  Topamax refills sent in on 12/29/22. E-[facility]

## 2023-01-05 NOTE — Telephone Encounter (Signed)
Pt is requesting a refill for gabapentin (NEURONTIN) 300 MG capsule.  Pharmacy: [facility]

## 2023-01-20 ENCOUNTER — Ambulatory Visit: Payer: Managed Care, Other (non HMO) | Admitting: Internal Medicine

## 2023-01-20 ENCOUNTER — Encounter: Payer: Self-pay | Admitting: Internal Medicine

## 2023-01-20 VITALS — BP 136/66 | HR 75 | Ht 69.0 in | Wt 319.6 lb

## 2023-01-20 DIAGNOSIS — E66813 Obesity, class 3: Secondary | ICD-10-CM

## 2023-01-20 DIAGNOSIS — Z7984 Long term (current) use of oral hypoglycemic drugs: Secondary | ICD-10-CM

## 2023-01-20 DIAGNOSIS — E890 Postprocedural hypothyroidism: Secondary | ICD-10-CM

## 2023-01-20 DIAGNOSIS — Z7985 Long-term (current) use of injectable non-insulin antidiabetic drugs: Secondary | ICD-10-CM | POA: Diagnosis not present

## 2023-01-20 DIAGNOSIS — E119 Type 2 diabetes mellitus without complications: Secondary | ICD-10-CM | POA: Diagnosis not present

## 2023-01-20 DIAGNOSIS — Z6841 Body Mass Index (BMI) 40.0 and over, adult: Secondary | ICD-10-CM

## 2023-01-20 LAB — POCT GLYCOSYLATED HEMOGLOBIN (HGB A1C): Hemoglobin A1C: 5.7 % — AB (ref 4.0–5.6)

## 2023-01-20 NOTE — Patient Instructions (Addendum)
Please continue: - Metformin 850 mg 2x a day - Ozempic 1 mg weekly  Please continue: - Levothyroxine 75 mcg daily.  Take the thyroid hormone every day, with water, at least 30 minutes before breakfast, separated by at least 4 hours from: - acid reflux medications - calcium - iron - multivitamins  Please return in 6 months with your sugar log.

## 2023-01-20 NOTE — Progress Notes (Signed)
Patient ID: Katie Harris, female   DOB: Aug 10, 1975, 47 y.o.   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 Last visit 6 months ago.  Interim history: No increased urination, chest pain, nausea.  She has neck pain. Her father passed away 3 months ago - she is grieving.  Reviewed history: Pt was diagnosed with thyrotoxicosis in 11/2014. At that time, she was taking several thyrotoxic supplements and also took Biotin.  Before our first visit, she was on biotin, B6 vitamin, Boswellia, but was previously on [place], [employer] - stopped 3-4 weeks prior to the appt. I advised her to stay off all the supplements and hold the Biotin few days before next TFTs.  Her TSI antibodies were not elevated.   TFTs continued to fluctuate but she continued to have suppressed TSH and elevated free T3.  Thyroid Uptake and scan which she had on 10/25/2015: Elevated 24 hour radio iodine uptake of 42%.  Large hot nodule within LEFT thyroid lobe with suppression of uptake within the RIGHT lobe consistent with a toxic adenoma.   11/09/2015: RAI treatment  She developed post ablative hypothyroidism afterwards.  We started on levothyroxine 50 mcg daily and increased the dose to 75 mcg daily 05/2018.  She takes this: - in am - fasting - at least 30 min from b'fast - no Ca, Fe, PPIs - + MVIs in the pm - on Biotin in Hair Skin and Nails  Reviewed her TFTs: Lab Results  Component Value Date   TSH 1.57 08/22/2022   TSH 1.54 06/11/2021   TSH 1.26 12/17/2020   TSH 1.50 06/08/2020   TSH 1.96 11/21/2019   TSH 2.57 01/14/2019   TSH 3.71 05/10/2018   TSH 3.35 12/22/2017   TSH 6.830 (H) 09/08/2017   TSH 4.780 (H) 04/23/2017   FREET4 0.9 08/22/2022   FREET4 0.77 06/11/2021   FREET4 0.79 12/17/2020   FREET4  0.81 06/08/2020   FREET4 0.68 11/21/2019   FREET4 1.0 01/14/2019   FREET4 0.95 05/10/2018   FREET4 0.84 12/22/2017   FREET4 0.90 09/08/2017   FREET4 0.95 04/23/2017   T3FREE 3.3 07/29/2016   T3FREE 2.7 03/31/2016   T3FREE 3.8 12/21/2015   T3FREE 4.5 (H) 10/15/2015   T3FREE 5.0 (H) 08/31/2015   T3FREE 5.2 (H) 07/19/2015  11/07/2014: TSH <0.01, fT4 1.54 (0.61-1.18)  TSI's were not elevated: Lab Results  Component Value Date   TSI <89 07/19/2015   Pt denies: - feeling nodules in neck - hoarseness - dysphagia - choking  Pt does have a FH of thyroid ds: hypothyroidism in mother, possibly MGM. No FH of thyroid cancer. No h/o radiation tx to head or neck other than RAI treatment. No herbal supplements. No recent steroids use.   She also has a history of OSA, PCOS, and pseudotumor cerebri. She has a history of IBS and has abdominal pain and diarrhea after higher fiber meals.   DM2: - dx'ed 2019  She is currently on: - Metformin 500 mg >> 850 mg 2x a day   - Ozempic 0.5 mg weekly - started 01/2019 - had mild nausea >> resolved >> 1 mg weekly  She was checking sugars 0 to 1x a day: - am: 122-147, 150 >> 120-135, 140 >> 118-131,161 >> 122 - 2h after b'fast: 109, 111 >> n/c >> 118, 138 >>  183, 203 >> n/c - lunch:  116 >> n/c >> 134 >> n/c >> 112 >> 105-140 >> 109 - 2h after lunch: 94-140, 157, 158 >> 100-130 >>  110-144 >> 155 - dinner: 95-121 >> n/c >> 119, 128, 133 >> n/c >> 96, 110 >> 120 - 2h after dinner: 204 >> n/c >> 128 >> n/c - bedtime: 182 >> n/c Lowest: 89 >> 94 >> 98 >> 96 >> 84. Highest: 145 >> 158 >> 200 x1 >> 203 after protein shake >> 180.  Reviewed HbA1c levels: Lab Results  Component Value Date   HGBA1C 5.8 (A) 07/22/2022   HGBA1C 5.8 (A) 12/11/2021   HGBA1C 5.7 (A) 06/11/2021   HGBA1C 5.8 (A) 12/11/2020   HGBA1C 5.7 (A) 06/08/2020   HGBA1C 5.5 11/21/2019   HGBA1C 5.8 (A) 04/21/2019   HGBA1C 7.9 (A) 01/14/2019   HGBA1C 6.6 (A) 05/10/2018   HGBA1C  5.9 (H) 09/08/2017   Normal kidney function: 01/14/2023: 9/0.62, GFR 109, glucose 118 Lab Results  Component Value Date   BUN 10 08/22/2022   Lab Results  Component Value Date   CREATININE 0.62 08/22/2022   CREATININE 0.68 06/11/2021   CREATININE 0.62 06/09/2019   CREATININE 0.70 04/21/2019   CREATININE 0.78 09/08/2017   CREATININE 0.71 04/23/2017   CREATININE 0.68 01/06/2017   CREATININE 0.62 10/09/2016   CREATININE 0.65 07/09/2016   CREATININE 0.62 03/22/2012   Lab Results  Component Value Date   MICRALBCREAT NOTE 08/22/2022   MICRALBCREAT 7.0 06/11/2021   MICRALBCREAT 0.5 04/21/2019   + HL: Lab Results  Component Value Date   CHOL 177 08/22/2022   HDL 42 (L) 08/22/2022   LDLCALC 108 (H) 08/22/2022   LDLDIRECT 143.0 04/21/2019   TRIG 157 (H) 08/22/2022   CHOLHDL 4.2 08/22/2022  She was found to have fatty liver on abdominal ultrasound from 09/28/2020. She is on pravastatin 20 mg daily.  Last eye exam was in 2023: No DR reportedly.  Last foot exam 07/22/2022.  She is on alpha-lipoic acid and Neurontin.  ROS:  + see HPI  I reviewed pt's medications, allergies, PMH, social hx, family hx, and changes were documented in the history of present illness. Otherwise, unchanged from my initial visit note.  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    Cancer (HCC)    basal cell removed from left hairline area   Depression    Diabetes mellitus without complication (HCC)    recent A1C 6.5 no meds presently   Factor 5 Leiden mutation, heterozygous (HCC) 04/21/2012   Lab 04/16/12 in view of positive Leiden in her father; no personal hx of thrombosis despite prior pregnancy & C-section 2007   Headache(784.0)    chronic due to pseudo tumor cerebri   HTN (hypertension), benign 04/21/2012   Hypertension    Hyperthyroidism    levels were abnormal in Dec- no treatment currently    Hyperthyroidism 04/21/2012   Infertility, female    Leg edema    PCOS (polycystic ovarian syndrome)    Peripheral vascular disease (HCC)    neuropathy bilateral feet   Prediabetes    Sleep apnea    Past Surgical History:  Procedure Laterality Date   CESAREAN SECTION  2007   DILATATION & CURETTAGE/HYSTEROSCOPY WITH MYOSURE N/A 10/17/2016   Procedure: DILATATION & CURETTAGE/HYSTEROSCOPY WITH MYOSURE POLYPECTOMY;  Surgeon: Senaida Ores,  Olegario Messier, MD;  Location: [facility];  Service: Gynecology;  Laterality: N/A;  myosure rep will be here confirmed on 10/06/16   DILATION AND CURETTAGE OF UTERUS     polpectomy   INTRAUTERINE DEVICE (IUD) INSERTION N/A 10/17/2016   Procedure: INTRAUTERINE DEVICE (IUD) INSERTION MIRENA;  Surgeon: Huel Cote, MD;  Location: [facility];  Service: Gynecology;  Laterality: N/A;   TONSILLECTOMY     Social History   Social History   Marital Status: Married    Spouse Name: N/A   Number of Children: 1   Occupational History   Land   Social History Main Topics   Smoking status: Never Smoker    Smokeless tobacco: Not on file   Alcohol Use: No   Drug Use: No   Current Outpatient Medications on File Prior to Visit  Medication Sig Dispense Refill   Alpha-Lipoic Acid 600 MG CAPS Take 600 mg by mouth daily.     Blood Glucose Monitoring Suppl (ONETOUCH VERIO) w/Device KIT Use as instructed to check blood sugar 1X daily 1 kit 0   cyclobenzaprine (FLEXERIL) 10 MG tablet at bedtime.   3   DULoxetine (CYMBALTA) 60 MG capsule Take 60 mg by mouth daily.   6   gabapentin (NEURONTIN) 300 MG capsule Take 1 capsule (300 mg total) by mouth in the morning AND 2 capsules (600 mg total) daily with lunch AND 3 capsules (900 mg total) at bedtime. 540 capsule 1   glucose blood (ONETOUCH VERIO) test strip Use as instructed to check blood sugar 1X daily 100 each 3   hyoscyamine (ANASPAZ) 0.125 MG TBDP disintergrating tablet Place 0.125 mg under the tongue 4 (four) times  daily -  before meals and at bedtime.      Lancets (ONETOUCH ULTRASOFT) lancets Use as instructed to check blood sugar 1X daily 100 each 3   levothyroxine (SYNTHROID) 75 MCG tablet TAKE 1 TABLET DAILY BEFORE BREAKFAST 90 tablet 3   Meloxicam 10 MG CAPS Take 1 tablet by mouth daily.     metFORMIN (GLUCOPHAGE) 850 MG tablet TAKE 1 TABLET TWICE A DAY WITH MEALS 180 tablet 3   metoprolol (LOPRESSOR) 50 MG tablet Take 50 mg by mouth 2 (two) times daily.      Multiple Vitamins-Minerals (ALIVE MULTI-VITAMIN PO) Take by mouth.     OZEMPIC, 1 MG/DOSE, 4 MG/3ML SOPN INJECT 1 MG UNDER THE SKIN WEEKLY 9 mL 3   pravastatin (PRAVACHOL) 20 MG tablet TAKE 1 TABLET(20 MG) BY MOUTH DAILY 90 tablet 1   topiramate (TOPAMAX) 25 MG tablet Take 2 tablets (50 mg total) by mouth daily. 180 tablet 1   VITAMIN D PO Take by mouth daily.     No current facility-administered medications on file prior to visit.   No Known Allergies Family History  Problem Relation Age of Onset   Hypertension Mother    Hyperlipidemia Mother    Thyroid disease Mother    Depression Mother    Anxiety disorder Mother    Sleep apnea Mother    Obesity Mother    Kidney cancer Mother 75   Heart disease Father    Diabetes Father    Hypertension Father    Hyperlipidemia Father    Obesity Father    Diabetes Maternal Grandmother    Hyperlipidemia Maternal Grandmother    Hyperlipidemia Maternal Grandfather    Hyperlipidemia Paternal Grandmother    Diabetes Paternal Grandmother    Cancer Paternal Grandmother    Heart disease Paternal Grandfather  PE: BP 136/66   Pulse 75   Ht 5\' 9"  (1.753 m)   Wt (!) 319 lb 9.6 oz (145 kg)   SpO2 99%   BMI 47.20 kg/m  Wt Readings from Last 3 Encounters:  01/20/23 (!) 319 lb 9.6 oz (145 kg)  07/22/22 (!) 320 lb 9.6 oz (145.4 kg)  12/11/21 (!) 323 lb 6.4 oz (146.7 kg)   Constitutional: overweight, in NAD Eyes:  EOMI, no exophthalmos ENT: no neck masses, no cervical  lymphadenopathy Cardiovascular: RRR, No MRG Respiratory: CTA B Musculoskeletal: no deformities Skin:no rashes Neurological: no tremor with outstretched hands  ASSESSMENT: 1.  Post ablative hypothyroidism -After RAI treatment for toxic adenoma  2. DM2, non-insulin-dependent,  with complications -Peripheral neuropathy  3.  Obesity class III  PLAN:  1. Patient with history of thyrotoxicosis with a diagnosis of left toxic adenoma, now status post RAI treatment in 11/2015, after which she developed post ablative hypothyroidism - latest thyroid labs reviewed with pt. >> normal: Lab Results  Component Value Date   TSH 1.57 08/22/2022  - she continues on LT4 75 mcg daily - pt feels good on this dose. - we discussed about taking the thyroid hormone every day, with water, >30 minutes before breakfast, separated by >4 hours from acid reflux medications, calcium, iron, multivitamins. Pt. is taking it correctly. - will check thyroid tests at next visit -I did advise her to remember to stop biotin before the visit  3. DM2 -Patient with well-controlled diabetes, on metformin and weekly GLP-1 receptor agonist.  At last visit HbA1c was stable, at goal, at 5.8%, but she was not checking sugars at home after running out of strips and lancets.  I called in a prescription for the One Touch Verio reflect glucometer to her pharmacy along with supplies.  We did not change her regimen at that time. -At today's visit, sugars are mostly at goal.  No need to change her regimen for now. -I advised her to: Patient Instructions  Please continue: - Metformin 850 mg 2x a day - Ozempic 1 mg weekly  Please continue: - Levothyroxine 75 mcg daily.  Take the thyroid hormone every day, with water, at least 30 minutes before breakfast, separated by at least 4 hours from: - acid reflux medications - calcium - iron - multivitamins  Please return in 6 months with your sugar log.   - we checked her HbA1c: 5.7%  (slightly lower) - advised to check sugars at different times of the day - 1x a day, rotating check times - advised for yearly eye exams >> she is UTD - return to clinic in 6 months  3.  Obesity class III -Will continue Ozempic which should also help with weight loss -She continues on Topamax for headaches-this can also help with weight loss -At last visit, she lost 3 pounds since the previous visit, previously lost 45 pounds before the prior 2 visits combined -She lost 1 pound since last visit  Carlus Pavlov, MD PhD [facility] Endocrinology

## 2023-05-01 NOTE — Therapy (Addendum)
 OUTPATIENT PHYSICAL THERAPY FEMALE PELVIC EVALUATION   Patient Name: Katie Harris MRN: [id number] DOB:October 20, 1975, 48 y.o., female Today's Date: 05/04/2023  END OF SESSION:  PT End of Session - 05/04/23 1449     Visit Number 1    Date for PT Re-Evaluation 11/01/23    Authorization Type [employer]    PT Start Time 1445    PT Stop Time 1525    PT Time Calculation (min) 40 min    Activity Tolerance Patient tolerated treatment well    Behavior During Therapy WFL for tasks assessed/performed             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    Cancer (HCC)    basal cell removed from left hairline area   Depression    Diabetes mellitus without complication (HCC)    recent A1C 6.5 no meds presently   Factor 5 Leiden mutation, heterozygous (HCC) 04/21/2012   Lab 04/16/12 in view of positive Leiden in her father; no personal hx of thrombosis despite prior pregnancy & C-section 2007   Headache(784.0)    chronic due to pseudo tumor cerebri   HTN (hypertension), benign 04/21/2012   Hypertension    Hyperthyroidism    levels were abnormal in Dec- no treatment currently   Hyperthyroidism 04/21/2012   Infertility, female    Leg edema    PCOS (polycystic ovarian syndrome)    Peripheral vascular disease (HCC)    neuropathy bilateral feet   Prediabetes    Sleep apnea    Past Surgical History:  Procedure Laterality Date   CESAREAN SECTION  2007   DILATATION & CURETTAGE/HYSTEROSCOPY WITH MYOSURE N/A 10/17/2016   Procedure: DILATATION & CURETTAGE/HYSTEROSCOPY WITH MYOSURE POLYPECTOMY;  Surgeon: Rogene Claude, MD;  Location: [facility];  Service: Gynecology;  Laterality: N/A;  myosure rep will be here confirmed on 10/06/16   DILATION AND CURETTAGE OF UTERUS     polpectomy   INTRAUTERINE DEVICE (IUD) INSERTION N/A 10/17/2016   Procedure: INTRAUTERINE DEVICE (IUD) INSERTION MIRENA;  Surgeon:  Rogene Claude, MD;  Location: [facility];  Service: Gynecology;  Laterality: N/A;   TONSILLECTOMY     Patient Active Problem List   Diagnosis Date Noted   OSA on CPAP 07/06/2018   Chronic migraine without aura 07/06/2018   Chronic daily headache 07/06/2018   Vitamin D  deficiency 05/06/2017   Type 2 diabetes mellitus with diabetic neuropathy, without long-term current use of insulin  (HCC) 05/06/2017   Other fatigue 04/22/2017   Shortness of breath on exertion 04/22/2017   Prediabetes 04/22/2017   Postablative hypothyroidism 04/22/2017   Essential hypertension 04/22/2017   Class 3 severe obesity with serious comorbidity and body mass index (BMI) of 50.0 to 59.9 in adult [facility]) 04/22/2017   Diabetic neuropathy (HCC) 12/04/2016   IIH (idiopathic intracranial hypertension) (per patient Dx 1994 and untreated) 12/04/2016   Thyroid  adenoma, toxic 10/30/2015   Factor 5 Leiden mutation, heterozygous (HCC) 04/21/2012   HTN (hypertension), benign 04/21/2012    PCP: Arlys Berke, MD  REFERRING PROVIDER: Rogene Claude, MD   REFERRING DIAG: N39.3 (ICD-10-CM) - Female stress incontinence   THERAPY DIAG:  Muscle weakness (generalized) - Plan: PT plan of care cert/re-cert  Other lack of coordination - Plan: PT plan of care cert/re-cert  Rationale for Evaluation and Treatment: Rehabilitation  ONSET DATE: 2006  SUBJECTIVE:  SUBJECTIVE STATEMENT: Patient started to have issues when she had her daughter. Last 5-6 years she has to wear a pad daily. Patient has lost weight and the incontinence has gotten worse.  Fluid intake:   PAIN:  Are you having pain? No  PRECAUTIONS: Other: cancer  RED FLAGS: None   WEIGHT BEARING RESTRICTIONS: No  FALLS:  Has patient fallen in last 6 months? 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    URINATION: Pain with urination: No Fully empty bladder: Yes:  sometimes she feels like she is ready to go back to the bathroom Stream: Strong Urgency: No Frequency: average Leakage: Coughing, Sneezing, Laughing, Exercise, Lifting, Intercourse, and sit to stand especially she is carrying something, sudden movement Pads: Yes: 1 pad  INTERCOURSE:  Ability to have vaginal penetration Yes  Pain with intercourse: none  PREGNANCY: C-section deliveries 1  PROLAPSE: None   OBJECTIVE:  Note: Objective measures were completed at Evaluation unless otherwise noted.  DIAGNOSTIC FINDINGS:  none  COGNITION: Overall cognitive status: Within functional limits for tasks assessed     SENSATION: Light touch: Appears intact    LUMBARAROM/PROM: lumbar ROM is full   LOWER EXTREMITY ROM: bilateral hip ROM is full   LOWER EXTREMITY MMT: Bilateral hip strength is 5/5  PALPATION:   General: difficulty with contracting her abdominals; the c-section scar is healed well and no restrictions. She has redness along the right side of where the abdominal tissue rolls over the pubic bone area and the tissue is shiny.   Pelvic Alignment: ASIS are equal  Abdominal: where her abdominal tissue overlaps there is some redness on the left side                External Perineal Exam: intact                             Internal Pelvic Floor: no tenderness  Patient confirms identification and approves PT to assess internal pelvic floor and treatment Yes  PELVIC MMT:   MMT eval  Vaginal 2/5  (Blank rows = not tested)        TONE: average  PROLAPSE: none  TODAY'S TREATMENT:                                                                                                                               DATE: 05/04/23  EVAL see below   PATIENT EDUCATION:  05/04/23 Education details: edcuated patient on vaginal moisturizers, Access Code: [email or web address], educated patient on how to check her skin where it rolls over the pubic bone to avoid break down. Person educated: Patient Education method: Explanation,  Demonstration, Tactile cues, Verbal cues, and Handouts Education comprehension: verbalized understanding, returned demonstration, verbal cues required, tactile cues required, and needs further education  HOME EXERCISE PROGRAM: 05/04/23 Access Code: [email or web address] URL: [email or web address] Date: 05/04/2023 Prepared by: [name]  Exercises - Hooklying Isometric Hip Flexion  - 1 x daily - 7 x weekly - 1 sets - 10 reps - Seated Abdominal Press into [employer]  - 1 x daily - 7 x weekly - 1 sets - 10 reps - Seated Pelvic Floor Contraction  - 3 x daily - 7 x weekly - 1 sets - 10 reps  ASSESSMENT:  CLINICAL IMPRESSION: Patient is a 48 y.o. female who was seen today for physical therapy evaluation and treatment for stress incontinence. Patient reports she started to have urinary issues since she had her daughter 18 years ago. The last 5 years she has had to use a pad. Patient will leak urine with coughing, sneezing, laughing, exercise, lifting, intercourse, and sit to stand especially she is carrying something, sudden movement. Patient pelvic floor strength is 2/5 and needs verbal cues to not contract her gluteals. She reports vaginal dryness with intercourse and was educated on vaginal moisturizers. Patient will bulge her abdomen with contraction. Patient had some redness along the area where her abdominal tissue rolls over the pubic area and showed her how to check the skin. Patient will benefit from skilled therapy to reduce urinary leakage with good muscle contraction.   OBJECTIVE IMPAIRMENTS:  decreased strength.   ACTIVITY LIMITATIONS: lifting, continence, and locomotion level  PARTICIPATION LIMITATIONS: shopping, community activity, and occupation  PERSONAL FACTORS: Time since onset of injury/illness/exacerbation are also affecting patient's functional outcome.   REHAB POTENTIAL: Excellent  CLINICAL DECISION MAKING: Stable/uncomplicated  EVALUATION COMPLEXITY: Low   GOALS: Goals reviewed with patient? Yes  SHORT TERM GOALS: Target date: 05/31/23  Patient educated on vaginal moisturizers to reduce dryness in the vaginal canal.  Baseline: Goal status: INITIAL  2.  Patient educated on how to contract and feel the pelvic floor and not substitute the gluteals.  Baseline:  Goal status: INITIAL  3. Patient educated on how to check her skin in the lower abdominal area to reduce the risk of break down.  Baseline:  Goal status: INITIAL   LONG TERM GOALS: Target date: 11/01/23  Patient independent with advanced HEP for pelvic floor and core strength to reduce urinary leakage.  Baseline:  Goal status: INITIAL  2.  Patient is able to go from sit to stand with minimal urinary leakage due to the ability to contract the pelvic floor during the motion.  Baseline:  Goal status: INITIAL  3.  Patient is able to lift items with minimal to no urinary leakage due to understanding pressure management on the pelvic floor.  Baseline:  Goal status: INITIAL  4.  Patient is able to cough and sneeze with minimal to no urinary leakage due to increased pelvic floor strength >/= 3/5 without contracting the gluteals.   Baseline:  Goal status: INITIAL   PLAN:  PT FREQUENCY: 1-2x/week  PT DURATION: 6 months  PLANNED INTERVENTIONS: 97110-Therapeutic exercises, 97530- Therapeutic activity, 97112- Neuromuscular re-education, 97535- Self Care, 16109- Manual therapy, [phone]- Aquatic Therapy, Patient/Family education, and Biofeedback  PLAN FOR NEXT SESSION: abdominal contraction, pelvic  floor without substitution, diaphragmatic breathing   [name], PT 05/04/23 3:53 PM   PHYSICAL THERAPY DISCHARGE SUMMARY  Visits from Start of Care: 1  Current functional level related to goals / functional outcomes: See above.  Remaining deficits: See above. Patient called today to cancel all of her remaining appointments with no reason given.    Education / Equipment: HEP   Patient agrees to discharge. Patient goals were not met. Patient is being discharged due to the patient's request. Thank you for the referral.   [name], PT 07/21/23 2:15 PM

## 2023-05-04 ENCOUNTER — Other Ambulatory Visit: Payer: Self-pay

## 2023-05-04 ENCOUNTER — Encounter: Payer: Self-pay | Admitting: Physical Therapy

## 2023-05-04 ENCOUNTER — Ambulatory Visit: Payer: Managed Care, Other (non HMO) | Attending: Obstetrics and Gynecology | Admitting: Physical Therapy

## 2023-05-04 DIAGNOSIS — R278 Other lack of coordination: Secondary | ICD-10-CM | POA: Insufficient documentation

## 2023-05-04 DIAGNOSIS — M6281 Muscle weakness (generalized): Secondary | ICD-10-CM | POA: Diagnosis present

## 2023-05-04 NOTE — Patient Instructions (Signed)
 Moisturizers They are used in the vagina to hydrate the mucous membrane that make up the vaginal canal. Designed to keep a more normal acid balance (ph) Once placed in the vagina, it will last between two to three days.  Use 2-3 times per week at bedtime  Ingredients to avoid is glycerin and fragrance, can increase chance of infection Should not be used just before sex due to causing irritation Most are gels administered either in a tampon-shaped applicator or as a vaginal suppository. They are non-hormonal.   Types of Moisturizers(internal use)  Vitamin E vaginal suppositories- Whole foods, Amazon Moist Again Coconut oil- can break down condoms, any grocery store (prefer organic) Julva- (Do no use if taking  Tamoxifen) amazon Yes moisturizer- amazon NeuEve Silk , NeuEve Silver for menopausal or over 65 (if have severe vaginal atrophy or cancer treatments use NeuEve Silk for  1 month than move to [employer])- [employer], [place].com Olive and Bee intimate cream- www.oliveandbee.com.au Mae vaginal moisturizer- Amazon Aloe Good Clean Love Hyaluronic acid Hyalofemme Reveree hyaluronic acid inserts   Creams to use externally on the Vulva area Marathon Oil (good for for cancer patients that had radiation to the area)- [place] or [employer].https://garcia-[email or web address]/ Vulva Balm/ V-magic cream by medicine mama- amazon Julva-amazon Vital "V Wild Yam salve ( help moisturize and help with thinning vulvar area, does have Beeswax MoodMaid Botanical Pro-Meno Wild Yam Cream- Amazon Desert Harvest Gele Cleo by Zane Herald labial moisturizer (Amazon),  Coconut or olive oil aloe Good Clean Love Enchanted Rose by intimate rose  Things to avoid in the vaginal area Do not use things to irritate the vulvar area No lotions just specialized creams for the vulva area- Neogyn, V-magic,  No soaps; can use Aveeno or Calendula cleanser, unscented Dove if needed. Must be gentle No deodorants No douches Good to  sleep without underwear to let the vaginal area to air out No scrubbing: spread the lips to let warm water rinse over labias and pat dry   [facility] [address] [place], [place] [address] Phone # [phone] Fax [phone]

## 2023-06-16 ENCOUNTER — Other Ambulatory Visit: Payer: Self-pay | Admitting: *Deleted

## 2023-06-16 MED ORDER — GABAPENTIN 300 MG PO CAPS: ORAL_CAPSULE | ORAL | 0 refills | Status: DC

## 2023-06-29 ENCOUNTER — Telehealth: Payer: Self-pay | Admitting: Family Medicine

## 2023-06-29 MED ORDER — TOPIRAMATE 25 MG PO TABS: 50.0000 mg | ORAL_TABLET | Freq: Every day | ORAL | 0 refills | Status: DC

## 2023-06-29 NOTE — Telephone Encounter (Signed)
 Last seen 11/19/2021 and next f/u 07/20/23.  E-scribed refill

## 2023-06-29 NOTE — Telephone Encounter (Signed)
 Pt is requesting a refill for topiramate  (TOPAMAX ) 25 MG tablet .  Pharmacy: [facility] DELIVERY

## 2023-07-08 ENCOUNTER — Ambulatory Visit: Payer: Managed Care, Other (non HMO) | Admitting: Physical Therapy

## 2023-07-13 ENCOUNTER — Ambulatory Visit: Payer: Managed Care, Other (non HMO) | Attending: Obstetrics and Gynecology | Admitting: Physical Therapy

## 2023-07-13 ENCOUNTER — Telehealth: Payer: Self-pay | Admitting: Physical Therapy

## 2023-07-13 DIAGNOSIS — M6281 Muscle weakness (generalized): Secondary | ICD-10-CM | POA: Insufficient documentation

## 2023-07-13 DIAGNOSIS — R278 Other lack of coordination: Secondary | ICD-10-CM | POA: Insufficient documentation

## 2023-07-13 NOTE — Telephone Encounter (Signed)
 Called patient about her missed appointment today at 1615. Left a message.  [name], PT @5 /5/25@ 4:51 PM

## 2023-07-16 NOTE — Patient Instructions (Incomplete)
 Below is our plan:  We will ***  Please continue using your CPAP regularly. While your insurance requires that you use CPAP at least 4 hours each night on 70% of the nights, I recommend, that you not skip any nights and use it throughout the night if you can. Getting used to CPAP and staying with the treatment long term does take time and patience and discipline. Untreated obstructive sleep apnea when it is moderate to severe can have an adverse impact on cardiovascular health and raise her risk for heart disease, arrhythmias, hypertension, congestive heart failure, stroke and diabetes. Untreated obstructive sleep apnea causes sleep disruption, nonrestorative sleep, and sleep deprivation. This can have an impact on your day to day functioning and cause daytime sleepiness and impairment of cognitive function, memory loss, mood disturbance, and problems focussing. Using CPAP regularly can improve these symptoms.  We will update supply orders, today. You are eligible for a new machine. I will order a repeat sleep study that you will do at home. Please listen out for a call from our sleep lab staff to schedule. Once you complete study, our sleep doctors with evaluate the data and we will use that to order a new machine as indicated. This process could take a few weeks. Once new machine is ordered, you will hear back from your DME company to schedule set up of your new machine.   We will need to see you back within 31-90 days following set up of your new CPAP to document compliance as required by your insurance company. Please call the office to schedule follow up when you receive your new machine. Please feel free to reach out with any questions or concerns.    Please make sure you are staying well hydrated. I recommend 50-60 ounces daily. Well balanced diet and regular exercise encouraged. Consistent sleep schedule with 6-8 hours recommended.   Please continue follow up with care team as directed.    Follow up with *** in ***  You may receive a survey regarding today's visit. I encourage you to leave honest feed back as I do use this information to improve patient care. Thank you for seeing me today!   GENERAL HEADACHE INFORMATION:   Natural supplements: Magnesium Oxide or Magnesium Glycinate 500 mg at bed (up to 800 mg daily) Coenzyme Q10 300 mg in AM Vitamin B2- 200 mg twice a day   Add 1 supplement at a time since even natural supplements can have undesirable side effects. You can sometimes buy supplements cheaper (especially Coenzyme Q10) at www.WebmailGuide.co.za or at [facility].  Migraine with aura: There is increased risk for stroke in women with migraine with aura and a contraindication for the combined contraceptive pill for use by women who have migraine with aura. The risk for women with migraine without aura is lower. However other risk factors like smoking are far more likely to increase stroke risk than migraine. There is a recommendation for no smoking and for the use of OCPs without estrogen such as progestogen only pills particularly for women with migraine with aura.Aaron Aas People who have migraine headaches with auras may be 3 times more likely to have a stroke caused by a blood clot, compared to migraine patients who don't see auras. Women who take hormone-replacement therapy may be 30 percent more likely to suffer a clot-based stroke than women not taking medication containing estrogen. Other risk factors like smoking and high blood pressure may be  much more important.    Vitamins  and herbs that show potential:   Magnesium: Magnesium (250 mg twice a day or 500 mg at bed) has a relaxant effect on smooth muscles such as blood vessels. Individuals suffering from frequent or daily headache usually have low magnesium levels which can be increase with daily supplementation of 400-750 mg. Three trials found 40-90% average headache reduction  when used as a preventative. Magnesium may help  with headaches are aura, the best evidence for magnesium is for migraine with aura is its thought to stop the cortical spreading depression we believe is the pathophysiology of migraine aura.Magnesium also demonstrated the benefit in menstrually related migraine.  Magnesium is part of the messenger system in the serotonin cascade and it is a good muscle relaxant.  It is also useful for constipation which can be a side effect of other medications used to treat migraine. Good sources include nuts, whole grains, and tomatoes. Side Effects: loose stool/diarrhea  Riboflavin (vitamin B 2) 200 mg twice a day. This vitamin assists nerve cells in the production of ATP a principal energy storing molecule.  It is necessary for many chemical reactions in the body.  There have been at least 3 clinical trials of riboflavin using 400 mg per day all of which suggested that migraine frequency can be decreased.  All 3 trials showed significant improvement in over half of migraine sufferers.  The supplement is found in bread, cereal, milk, meat, and poultry.  Most Americans get more riboflavin than the recommended daily allowance, however riboflavin deficiency is not necessary for the supplements to help prevent headache. Side effects: energizing, green urine   Coenzyme Q10: This is present in almost all cells in the body and is critical component for the conversion of energy.  Recent studies have shown that a nutritional supplement of CoQ10 can reduce the frequency of migraine attacks by improving the energy production of cells as with riboflavin.  Doses of 150 mg twice a day have been shown to be effective.   Melatonin: Increasing evidence shows correlation between melatonin secretion and headache conditions.  Melatonin supplementation has decreased headache intensity and duration.  It is widely used as a sleep aid.  Sleep is natures way of dealing with migraine.  A dose of 3 mg is recommended to start for headaches including  cluster headache. Higher doses up to 15 mg has been reviewed for use in Cluster headache and have been used. The rationale behind using melatonin for cluster is that many theories regarding the cause of Cluster headache center around the disruption of the normal circadian rhythm in the brain.  This helps restore the normal circadian rhythm.   HEADACHE DIET: Foods and beverages which may trigger migraine Note that only 20% of headache patients are food sensitive. You will know if you are food sensitive if you get a headache consistently 20 minutes to 2 hours after eating a certain food. Only cut out a food if it causes headaches, otherwise you might remove foods you enjoy! What matters most for diet is to eat a well balanced healthy diet full of vegetables and low fat protein, and to not miss meals.   Chocolate, other sweets ALL cheeses except cottage and cream cheese Dairy products, yogurt, sour cream, ice cream Liver Meat extracts (Bovril, Marmite, meat tenderizers) Meats or fish which have undergone aging, fermenting, pickling or smoking. These include: Hotdogs,salami,Lox,sausage, mortadellas,smoked salmon, pepperoni, Pickled herring Pods of broad bean (English beans, Chinese pea pods, [place] (fava) beans, lima and navy beans Ripe avocado,  ripe banana Yeast extracts or active yeast preparations such as Brewer's or Fleishman's (commercial bakes goods are permitted) Tomato based foods, pizza (lasagna, etc.)   MSG (monosodium glutamate) is disguised as many things; look for these common aliases: Monopotassium glutamate Autolysed yeast Hydrolysed protein Sodium caseinate "flavorings" "all natural preservatives" Nutrasweet   Avoid all other foods that convincingly provoke headaches.   Resources: The Dizzy Althia Jetty Your Headache Diet, migrainestrong.com  https://zamora-andrews.[email or web address]   Caffeine and Migraine For patients that have migraine,  caffeine intake more than 3 days per week can lead to dependency and increased migraine frequency. I would recommend cutting back on your caffeine intake as best you can. The recommended amount of caffeine is 200-300 mg daily, although migraine patients may experience dependency at even lower doses. While you may notice an increase in headache temporarily, cutting back will be helpful for headaches in the long run. For more information on caffeine and migraine, visit: https://americanmigrainefoundation.org/resource-library/caffeine-and-migraine/   Headache Prevention Strategies:   1. Maintain a headache diary; learn to identify and avoid triggers.  - This can be a simple note where you log when you had a headache, associated symptoms, and medications used - There are several smartphone apps developed to help track migraines: Migraine Buddy, Migraine Monitor, Curelator N1-Headache App   Common triggers include: Emotional triggers: Emotional/Upset family or friends Emotional/Upset occupation Business reversal/success Anticipation anxiety Crisis-serious Post-crisis periodNew job/position   Physical triggers: Vacation Day Weekend Strenuous Exercise High Altitude Location New Move Menstrual Day Physical Illness Oversleep/Not enough sleep Weather changes Light: Photophobia or light sesnitivity treatment involves a balance between desensitization and reduction in overly strong input. Use dark polarized glasses outside, but not inside. Avoid bright or fluorescent light, but do not dim environment to the point that going into a normally lit room hurts. Consider FL-41 tint lenses, which reduce the most irritating wavelengths without blocking too much light.  These can be obtained at axonoptics.com or theraspecs.com Foods: see list above.   2. Limit use of acute treatments (over-the-counter medications, triptans, etc.) to no more than 2 days per week or 10 days per month to prevent medication  overuse headache (rebound headache).     3. Follow a regular schedule (including weekends and holidays): Don't skip meals. Eat a balanced diet. 8 hours of sleep nightly. Minimize stress. Exercise 30 minutes per day. Being overweight is associated with a 5 times increased risk of chronic migraine. Keep well hydrated and drink 6-8 glasses of water per day.   4. Initiate non-pharmacologic measures at the earliest onset of your headache. Rest and quiet environment. Relax and reduce stress. Breathe2Relax is a free app that can instruct you on    some simple relaxtion and breathing techniques. Http://Dawnbuse.com is a    free website that provides teaching videos on relaxation.  Also, there are  many apps that   can be downloaded for "mindful" relaxation.  An app called YOGA NIDRA will help walk you through mindfulness. Another app called Calm can be downloaded to give you a structured mindfulness guide with daily reminders and skill development. Headspace for guided meditation Mindfulness Based Stress Reduction Online Course: www.palousemindfulness.com Cold compresses.   5. Don't wait!! Take the maximum allowable dosage of prescribed medication at the first sign of migraine.   6. Compliance:  Take prescribed medication regularly as directed and at the first sign of a migraine.   7. Communicate:  Call your physician when problems arise, especially if your headaches change, increase in frequency/severity, or  become associated with neurological symptoms (weakness, numbness, slurred speech, etc.). Proceed to emergency room if you experience new or worsening symptoms or symptoms do not resolve, if you have new neurologic symptoms or if headache is severe, or for any concerning symptom.   8. Headache/pain management therapies: Consider various complementary methods, including medication, behavioral therapy, psychological counselling, biofeedback, massage therapy, acupuncture, dry needling, and other  modalities.  Such measures may reduce the need for medications. Counseling for pain management, where patients learn to function and ignore/minimize their pain, seems to work very well.   9. Recommend changing family's attention and focus away from patient's headaches. Instead, emphasize daily activities. If first question of day is 'How are your headaches/Do you have a headache today?', then patient will constantly think about headaches, thus making them worse. Goal is to re-direct attention away from headaches, toward daily activities and other distractions.   10. Helpful Websites: www.AmericanHeadacheSociety.org PatentHood.ch www.headaches.org TightMarket.nl www.achenet.org

## 2023-07-16 NOTE — Progress Notes (Deleted)
 PATIENT: Katie Harris DOB: [birth year]  REASON FOR VISIT: follow up HISTORY FROM: patient  No chief complaint on file.    HISTORY OF PRESENT ILLNESS:  07/16/23 ALL: Katie Harris returns for follow up for migraines, neuropathy and OSA on CPAP.   She continues gabapentin  300/600/900. Neuropathy is  Migraines are . She continues topiramate  50mg  daily.   Sleep Set up 2019???  11/19/21 ALL (mychart): Katie Harris returns for follow up for migraines. She was last seen 07/2021 and reported daily headaches with at least 5-6 migraines every month. We started her on started on Ajovy  and continued topiramate  and gabapentin . Since, she reports taking Ajovy  for 2 months. She was experiencing constipation so she stopped injections. She reports severity of headaches may be a little better. She continues to have daily headaches. Maybe 3-4 migraines. Tylenol  and rest abort migraines.   BP and CBGs have been normal. TSH has been stable. She drinks about 100 ounces of water daily. She usually sleeps well. Using CPAP daily. AHI well managed in 06/2021.   07/09/2021 ALL (mychart): Katie Harris is a 48 y.o. female here today for follow up for migraines and neuropathy ([place]) and OSA on CPAP ([place]). She continues topiramate  50mg  daily and gabapentin  300/600/900. She reports headaches are unchanged. She has a headache most every day. She is having 5-6 migrainous headaches every month. Tylenol  and rest are usually effective to abort migraine. Neuropathy stable. She is using CPAP nightly for at least 4 hours. No concerns with machine or supplies.      07/09/2020 ALL:  She returns for follow up for migraines (Dr Tresia Fruit) and OSA on CPAP (Dr Omar Bibber). She continues topiramate  50mg  daily and gabapentin  300/600/900. She feels that she is doing well. She does continues to have some sort of mild headache most days. She has about 2 severe migraines per month. She will take Tylenol  and rest and migraine usually resolves by the next  day. She has tried abortive meds (unsure of which) in the past but did not like how they made her feel. She does not wish to add prevention meds.     She continues to do well with CPAP therapy. She is using therapy nightly. She does feel it helps with sleep. She denies concerns with machine or supplies.      07/07/2019 ALL:  Katie Harris is a 48 y.o. female here today for follow up of headaches and neuropathy. She continues topiramate . Dose decreased from 50 to 25mg  daily due to numbness and tingling of fingers but now feels it may have been more related to getting a COVID vaccine. She has resumed 50mg  dosing. Headaches are well managed. She may have 2-3 per month. .She continues gabapentin  300/600/900. She occasionally takes 300mg  at bedtime if neuropathy is bad. She continues CPAP nightly. BP has been normal. Last A1C was 5.3. She is now on Ozepmpic. She has lost 45 pounds. Last eye exam 2 weeks ago and needed new lenses.    HISTORY: (copied from my note on 01/06/2019)   Katie Harris is a 48 y.o. female here today for follow up of headaches and neuropathy pain. We started her back on topiramate  in April. She has taken 50mg  at bedtime and feels that headaches have improved. She continues to have mild daily headaches. She reports bifrontal retro orbital headaches. Feels pressure behind her eyes. She has not had any migraines/migraious symptoms over the past 4-5 months. She reports neuropathy has been stable. She has had  more pain today than normal. She has had some tingling of right ring finger. This is not limiting her activity. She reports taking gabapentin  300mg  in am, 600mg  at 3p and 900mg  at bedtime. 600mg  TID ordered with Dr Tresia Fruit in 06/2017. She is working closely with her endocrinologist for elevated CBG. Readings are consistently around 200. She was diagnosed with IBS recently. Blood pressures are usually 130's/90's. Today's reading was 156/100 and 144/92 on recheck.  She is doing very well  with CPAP therapy at home.  She reports nightly compliance.  Compliance report reviewed from 11/05/2018 through 01/04/2019.  She is using CPAP therapy nightly and for greater than 4 hours each night.  Average usage is 8 hours and 48 minutes.  AHI is 1.9 on 5 to 13 cm of water and an EPR of 3.  There is no significant leak.      REVIEW OF SYSTEMS: Out of a complete 14 system review of symptoms, the patient complains only of the following symptoms, headaches, neuropathy and all other reviewed systems are negative.  ESS:1 FSS:36  ALLERGIES: No Known Allergies  HOME MEDICATIONS: Outpatient Medications Prior to Visit  Medication Sig Dispense Refill   Alpha-Lipoic Acid 600 MG CAPS Take 600 mg by mouth daily.     Blood Glucose Monitoring Suppl (ONETOUCH VERIO) w/Device KIT Use as instructed to check blood sugar 1X daily 1 kit 0   cyclobenzaprine  (FLEXERIL ) 10 MG tablet at bedtime.   3   DULoxetine (CYMBALTA) 60 MG capsule Take 60 mg by mouth daily.   6   gabapentin  (NEURONTIN ) 300 MG capsule Take 1 capsule (300 mg total) by mouth in the morning AND 2 capsules (600 mg total) daily with lunch AND 3 capsules (900 mg total) at bedtime. 180 capsule 0   glucose blood (ONETOUCH VERIO) test strip Use as instructed to check blood sugar 1X daily 100 each 3   hyoscyamine (ANASPAZ) 0.125 MG TBDP disintergrating tablet Place 0.125 mg under the tongue 4 (four) times daily -  before meals and at bedtime.  (Patient not taking: Reported on 01/20/2023)     Lancets (ONETOUCH ULTRASOFT) lancets Use as instructed to check blood sugar 1X daily 100 each 3   levothyroxine  (SYNTHROID ) 75 MCG tablet TAKE 1 TABLET DAILY BEFORE BREAKFAST 90 tablet 3   Meloxicam 10 MG CAPS Take 1 tablet by mouth daily.     metFORMIN  (GLUCOPHAGE ) 850 MG tablet TAKE 1 TABLET TWICE A DAY WITH MEALS 180 tablet 3   metoprolol (LOPRESSOR) 50 MG tablet Take 50 mg by mouth 2 (two) times daily.      Multiple Vitamins-Minerals (ALIVE MULTI-VITAMIN PO)  Take by mouth.     OZEMPIC , 1 MG/DOSE, 4 MG/3ML SOPN INJECT 1 MG UNDER THE SKIN WEEKLY 9 mL 3   pravastatin  (PRAVACHOL ) 20 MG tablet TAKE 1 TABLET(20 MG) BY MOUTH DAILY 90 tablet 1   topiramate  (TOPAMAX ) 25 MG tablet Take 2 tablets (50 mg total) by mouth daily. Must keep follow up 07/20/23 for ongoing refills 180 tablet 0   VITAMIN D  PO Take by mouth daily.     No facility-administered medications prior to visit.    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   HTN (hypertension), benign 04/21/2012   Hypertension    Hyperthyroidism    levels were abnormal in Dec- no treatment currently   Hyperthyroidism 04/21/2012   Infertility, female    Leg edema    PCOS (polycystic ovarian syndrome)    Peripheral vascular disease (HCC)    neuropathy bilateral feet   Prediabetes    Sleep apnea     PAST SURGICAL HISTORY: Past Surgical History:  Procedure Laterality Date   CESAREAN SECTION  2007   DILATATION & CURETTAGE/HYSTEROSCOPY WITH MYOSURE N/A 10/17/2016   Procedure: DILATATION & CURETTAGE/HYSTEROSCOPY WITH MYOSURE POLYPECTOMY;  Surgeon: Rogene Claude, MD;  Location: [facility];  Service: Gynecology;  Laterality: N/A;  myosure rep will be here confirmed on 10/06/16   DILATION AND CURETTAGE OF UTERUS     polpectomy   INTRAUTERINE DEVICE (IUD) INSERTION N/A 10/17/2016   Procedure: INTRAUTERINE DEVICE (IUD) INSERTION MIRENA;  Surgeon: Rogene Claude, MD;  Location: [facility];  Service:  Gynecology;  Laterality: N/A;   TONSILLECTOMY      FAMILY HISTORY: Family History  Problem Relation Age of Onset   Hypertension Mother    Hyperlipidemia Mother    Thyroid  disease Mother    Depression Mother    Anxiety disorder Mother    Sleep apnea Mother    Obesity Mother    Kidney cancer Mother 63   Heart disease Father    Diabetes Father    Hypertension Father    Hyperlipidemia Father    Obesity Father    Diabetes Maternal Grandmother    Hyperlipidemia Maternal Grandmother    Hyperlipidemia Maternal Grandfather    Hyperlipidemia Paternal Grandmother    Diabetes Paternal Grandmother    Cancer Paternal Grandmother    Heart disease Paternal Grandfather     SOCIAL HISTORY: Social History   Socioeconomic History   Marital status: Married    Spouse name: Deyna Marbut   Number of children: 1   Years of education: Not on file   Highest education level: Some college, no degree  Occupational History   Occupation: Land  Tobacco Use   Smoking status: Never   Smokeless tobacco: Never  Vaping Use   Vaping status: Never Used  Substance and Sexual Activity   Alcohol use: No   Drug use: No   Sexual activity: Yes    Birth control/protection: None  Other Topics Concern   Not on file  Social History Narrative   Lives at home with her husband, child and dog    Right handed   No caffeine      Social Drivers of Corporate investment banker Strain: Not on file  Food Insecurity: Not on file  Transportation Needs: Not on file  Physical Activity: Not on file  Stress: Not on file  Social Connections: Not on file  Intimate Partner Violence: Not on file      PHYSICAL EXAM  There were no vitals filed for this visit.  There is no height or weight on file to calculate BMI.  Generalized: Well developed, in no acute distress  Cardiology: normal rate and rhythm, no murmur noted Respiratory: clear to auscultation bilaterally  Neurological  examination   Mentation: Alert oriented to time, place, history taking. Follows all commands speech and language fluent Cranial nerve II-XII: Pupils were equal round reactive to light. Extraocular movements were full, visual field were full on confrontational test. Facial sensation and strength were normal.  Motor: The motor testing reveals 5 over 5 strength of all 4 extremities. Good symmetric motor tone is noted throughout.  Sensory: Sensory testing is intact to soft touch on all 4 extremities. No evidence of extinction is noted. Pinprick testing normal of bilateral lower extremities  Coordination: Cerebellar testing reveals good finger-nose-finger and heel-to-shin bilaterally.  Gait and station: Gait is normal.   DIAGNOSTIC DATA (LABS, IMAGING, TESTING) - I reviewed patient records, labs, notes, testing and imaging myself where available.      No data to display           Lab Results  Component Value Date   WBC 8.6 06/09/2019   HGB 13.3 06/09/2019   HCT 39.5 06/09/2019   MCV 88.4 06/09/2019   PLT 325.0 06/09/2019      Component Value Date/Time   NA 139 08/22/2022 1448   NA 137 09/08/2017 1021   K 4.3 08/22/2022 1448   CL 105 08/22/2022 1448   CO2 25 08/22/2022 1448   GLUCOSE 89 08/22/2022 1448   BUN 10 08/22/2022 1448   BUN 13 09/08/2017 1021   CREATININE 0.62 08/22/2022 1448   CALCIUM 9.3 08/22/2022 1448   PROT 6.6 08/22/2022 1448   PROT 7.0 09/08/2017 1021   ALBUMIN 4.3 06/11/2021 0833   ALBUMIN 4.1 09/08/2017 1021   AST 17 08/22/2022 1448   ALT 18 08/22/2022 1448   ALKPHOS 61 06/11/2021 0833   BILITOT 0.4 08/22/2022 1448   BILITOT 0.4 09/08/2017 1021   GFRNONAA 93 09/08/2017 1021   GFRAA 107 09/08/2017 1021   Lab Results  Component Value Date   CHOL 177 08/22/2022   HDL 42 (L) 08/22/2022   LDLCALC 108 (H) 08/22/2022   LDLDIRECT 143.0 04/21/2019   TRIG 157 (H) 08/22/2022   CHOLHDL 4.2 08/22/2022   Lab Results  Component Value Date   HGBA1C 5.7 (A) 01/20/2023    Lab Results  Component Value Date   VITAMINB12 398 04/23/2017   Lab Results  Component Value Date   TSH 1.57 08/22/2022       ASSESSMENT AND PLAN 48 y.o. year old female  has a past medical history of Anxiety, Arthritis, Back pain, Blood dyscrasia, Cancer (HCC), Depression, Diabetes mellitus without complication (HCC), Factor 5 Leiden mutation, heterozygous (HCC) (04/21/2012), Headache(784.0), HTN (hypertension), benign (04/21/2012), Hypertension, Hyperthyroidism, Hyperthyroidism (04/21/2012), Infertility, female, Leg edema, PCOS (polycystic ovarian syndrome), Peripheral vascular disease (HCC), Prediabetes, and Sleep apnea. here with   No diagnosis found.    Koralynn continues to do well. She does have mild headaches fairly frequently but does not wish to change medication regimen. We will continue topiramate  50mg  daily and gabapentin  300mg  in am, 600mg  at lunch and 900mg  at bedtime. She may take an extra dose (300mg ) of gabapentin  at bedtime as needed. She may continue Tylenol  as needed for abortive therapy. She thinks triptans caused significant side effects in past and prefers to avoid. May consider Nurtec or Ubrelvy if needed. Continue CPAP nightly and greater than 4 hours each night. She was encouraged to focus on healthy lifestyle habits with well balanced diet and regular exercise. She will continue close follow up with PCP and endocrinology. Follow up with us  in 1 year, sooner if needed.  No orders of the defined types were placed in this encounter.    No orders of the defined types were placed in this encounter.      Terrilyn Fick, FNP-C 07/16/2023, 12:57 PM [facility] [address] [place], [place] [address] [phone]

## 2023-07-20 ENCOUNTER — Ambulatory Visit: Payer: Managed Care, Other (non HMO) | Admitting: Family Medicine

## 2023-07-20 ENCOUNTER — Encounter: Payer: Self-pay | Admitting: Family Medicine

## 2023-07-20 DIAGNOSIS — G43709 Chronic migraine without aura, not intractable, without status migrainosus: Secondary | ICD-10-CM

## 2023-07-20 DIAGNOSIS — G4733 Obstructive sleep apnea (adult) (pediatric): Secondary | ICD-10-CM

## 2023-07-21 ENCOUNTER — Ambulatory Visit: Payer: Managed Care, Other (non HMO) | Admitting: Internal Medicine

## 2023-07-21 ENCOUNTER — Encounter: Payer: Self-pay | Admitting: Internal Medicine

## 2023-07-21 VITALS — BP 122/78 | HR 81 | Ht 69.0 in | Wt 324.0 lb

## 2023-07-21 DIAGNOSIS — E890 Postprocedural hypothyroidism: Secondary | ICD-10-CM | POA: Diagnosis not present

## 2023-07-21 DIAGNOSIS — E66813 Obesity, class 3: Secondary | ICD-10-CM

## 2023-07-21 DIAGNOSIS — Z7984 Long term (current) use of oral hypoglycemic drugs: Secondary | ICD-10-CM

## 2023-07-21 DIAGNOSIS — Z6841 Body Mass Index (BMI) 40.0 and over, adult: Secondary | ICD-10-CM

## 2023-07-21 DIAGNOSIS — Z7985 Long-term (current) use of injectable non-insulin antidiabetic drugs: Secondary | ICD-10-CM | POA: Diagnosis not present

## 2023-07-21 DIAGNOSIS — E119 Type 2 diabetes mellitus without complications: Secondary | ICD-10-CM | POA: Diagnosis not present

## 2023-07-21 LAB — POCT GLYCOSYLATED HEMOGLOBIN (HGB A1C): Hemoglobin A1C: 5.8 % — AB (ref 4.0–5.6)

## 2023-07-21 MED ORDER — METFORMIN HCL 850 MG PO TABS: ORAL_TABLET | ORAL | 3 refills | Status: AC

## 2023-07-21 MED ORDER — OZEMPIC (2 MG/DOSE) 8 MG/3ML ~~LOC~~ SOPN: 2.0000 mg | PEN_INJECTOR | SUBCUTANEOUS | 3 refills | Status: AC

## 2023-07-21 MED ORDER — ONETOUCH VERIO VI STRP: ORAL_STRIP | 3 refills | Status: AC

## 2023-07-21 MED ORDER — PRAVASTATIN SODIUM 20 MG PO TABS: 20.0000 mg | ORAL_TABLET | Freq: Every day | ORAL | 3 refills | Status: AC

## 2023-07-21 NOTE — Patient Instructions (Addendum)
 Please continue: - Metformin  850 mg 2x a day  Please increase: - Ozempic  2 mg weekly  Please continue: - Levothyroxine  75 mcg daily.  Take the thyroid  hormone every day, with water, at least 30 minutes before breakfast, separated by at least 4 hours from: - acid reflux medications - calcium - iron - multivitamins  Please return in 6 months with your sugar log.

## 2023-07-21 NOTE — Progress Notes (Signed)
 Patient ID: Katie Harris, female   DOB: 06/16/1975, 48 y.o.   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 Last visit 6 months ago.  Interim history: No increased urination, chest pain, nausea.   Reviewed history: Pt was diagnosed with thyrotoxicosis in 11/2014. At that time, she was taking several thyrotoxic supplements and also took Biotin.  Before our first visit, she was on biotin, B6 vitamin, [place], but was previously on [place], Coleus Forte - stopped 3-4 weeks prior to the appt. I advised her to stay off all the supplements and hold the Biotin few days before next TFTs.  Her TSI antibodies were not elevated.   TFTs continued to fluctuate but she continued to have suppressed TSH and elevated free T3.  Thyroid  Uptake and scan which she had on 10/25/2015: Elevated 24 hour radio iodine uptake of 42%.  Large hot nodule within LEFT thyroid  lobe with suppression of uptake within the RIGHT lobe consistent with a toxic adenoma.   11/09/2015: RAI treatment  She developed post ablative hypothyroidism afterwards.  We started on levothyroxine  50 mcg daily and increased the dose to 75 mcg daily 05/2018.  She takes this: - in am - fasting - at least 30 min from b'fast - no Ca, Fe, PPIs - + MVIs in the pm - on Biotin in Hair Skin and Nails (2500 mcg) - she forgot to stop this before the appointment.  Reviewed her TFTs: Lab Results  Component Value Date   TSH 1.57 08/22/2022   TSH 1.54 06/11/2021   TSH 1.26 12/17/2020   TSH 1.50 06/08/2020   TSH 1.96 11/21/2019   TSH 2.57 01/14/2019   TSH 3.71 05/10/2018   TSH 3.35 12/22/2017   TSH 6.830 (H) 09/08/2017   TSH 4.780 (H) 04/23/2017   FREET4 0.9 08/22/2022   FREET4 0.77 06/11/2021   FREET4 0.79 12/17/2020   FREET4 0.81 06/08/2020    FREET4 0.68 11/21/2019   FREET4 1.0 01/14/2019   FREET4 0.95 05/10/2018   FREET4 0.84 12/22/2017   FREET4 0.90 09/08/2017   FREET4 0.95 04/23/2017   T3FREE 3.3 07/29/2016   T3FREE 2.7 03/31/2016   T3FREE 3.8 12/21/2015   T3FREE 4.5 (H) 10/15/2015   T3FREE 5.0 (H) 08/31/2015   T3FREE 5.2 (H) 07/19/2015  11/07/2014: TSH <0.01, fT4 1.54 (0.61-1.18)  TSI's were not elevated: Lab Results  Component Value Date   TSI <89 07/19/2015   Pt denies: - feeling nodules in neck - hoarseness - dysphagia - choking  Pt does have a FH of thyroid  ds: hypothyroidism in mother, possibly MGM. No FH of thyroid  cancer. No h/o radiation tx to head or neck other than RAI treatment. No herbal supplements. No recent steroids use.   She also has a history of OSA, PCOS, and pseudotumor cerebri. She has a history of IBS and has abdominal pain and diarrhea after higher fiber meals.   DM2: - dx'ed 2019  Reviewed HbA1c levels: Lab Results  Component Value Date   HGBA1C 5.7 (A) 01/20/2023   HGBA1C 5.8 (A) 07/22/2022   HGBA1C 5.8 (A) 12/11/2021   HGBA1C 5.7 (A) 06/11/2021   HGBA1C 5.8 (A) 12/11/2020   HGBA1C 5.7 (A) 06/08/2020   HGBA1C 5.5 11/21/2019   HGBA1C 5.8 (A) 04/21/2019   HGBA1C 7.9 (A) 01/14/2019   HGBA1C 6.6 (A) 05/10/2018   She  is currently on: - Metformin  500 mg >> 850 mg 2x a day   - Ozempic  0.5 mg weekly - started 01/2019 >> 1 mg weekly  She was checking sugars 0 to 1x a day: - am: 122-147, 150 >> 120-135, 140 >> 118-131,161 >> 122 - 2h after b'fast: 109, 111 >> n/c >> 118, 138 >> 183, 203 >> n/c - lunch:  116 >> n/c >> 134 >> n/c >> 112 >> 105-140 >> 109 - 2h after lunch: 94-140, 157, 158 >> 100-130 >>  110-144 >> 155 - dinner: 95-121 >> n/c >> 119, 128, 133 >> n/c >> 96, 110 >> 120 - 2h after dinner: 204 >> n/c >> 128 >> n/c - bedtime: 182 >> n/c Lowest: 89 >> 94 >> 98 >> 96 >> 84. Highest: 145 >> 158 >> 200 x1 >> 203 after protein shake >> 180.  Normal kidney  function: 01/14/2023: 9/0.62, GFR 109, glucose 118 Lab Results  Component Value Date   BUN 10 08/22/2022   Lab Results  Component Value Date   CREATININE 0.62 08/22/2022   CREATININE 0.68 06/11/2021   CREATININE 0.62 06/09/2019   CREATININE 0.70 04/21/2019   CREATININE 0.78 09/08/2017   CREATININE 0.71 04/23/2017   CREATININE 0.68 01/06/2017   CREATININE 0.62 10/09/2016   CREATININE 0.65 07/09/2016   CREATININE 0.62 03/22/2012   Lab Results  Component Value Date   MICRALBCREAT NOTE 08/22/2022   MICRALBCREAT 7.0 06/11/2021   MICRALBCREAT 0.5 04/21/2019   + HL: Lab Results  Component Value Date   CHOL 177 08/22/2022   HDL 42 (L) 08/22/2022   LDLCALC 108 (H) 08/22/2022   LDLDIRECT 143.0 04/21/2019   TRIG 157 (H) 08/22/2022   CHOLHDL 4.2 08/22/2022  She was found to have fatty liver on abdominal ultrasound from 09/28/2020. She is on pravastatin  20 mg daily.  Last eye exam was in 06/29/2023: No DR reportedly.  Last foot exam 07/22/2022. She is on alpha-lipoic acid (in MVI) and Neurontin .  ROS:  + see HPI  I reviewed pt's medications, allergies, PMH, social hx, family hx, and changes were documented in the history of present illness. Otherwise, unchanged from my initial visit note.  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    Cancer (HCC)    basal cell removed from left hairline area   Depression    Diabetes mellitus without complication (HCC)    recent A1C 6.5 no meds presently   Factor 5 Leiden mutation, heterozygous (HCC) 04/21/2012   Lab 04/16/12 in view of positive Leiden in her father; no personal hx of thrombosis despite prior pregnancy & C-section 2007   Headache(784.0)    chronic due to pseudo tumor cerebri   HTN (hypertension), benign 04/21/2012   Hypertension    Hyperthyroidism    levels were abnormal in Dec- no treatment currently   Hyperthyroidism 04/21/2012    Infertility, female    Leg edema    PCOS (polycystic ovarian syndrome)    Peripheral vascular disease (HCC)    neuropathy bilateral feet   Prediabetes    Sleep apnea    Past Surgical History:  Procedure Laterality Date   CESAREAN SECTION  2007   DILATATION & CURETTAGE/HYSTEROSCOPY WITH MYOSURE N/A 10/17/2016   Procedure: DILATATION & CURETTAGE/HYSTEROSCOPY WITH MYOSURE POLYPECTOMY;  Surgeon: Rogene Claude, MD;  Location: [facility];  Service: Gynecology;  Laterality: N/A;  myosure rep will be here confirmed on 10/06/16   DILATION AND CURETTAGE OF UTERUS     polpectomy   INTRAUTERINE DEVICE (IUD) INSERTION N/A 10/17/2016   Procedure: INTRAUTERINE DEVICE (IUD) INSERTION MIRENA;  Surgeon: Rogene Claude, MD;  Location: [facility];  Service: Gynecology;  Laterality: N/A;   TONSILLECTOMY     Social History   Social History   Marital Status: Married    Spouse Name: N/A   Number of Children: 1   Occupational History   Land   Social History Main Topics   Smoking status: Never Smoker    Smokeless tobacco: Not on file   Alcohol Use: No   Drug Use: No   Current Outpatient Medications on File Prior to Visit  Medication Sig Dispense Refill   Alpha-Lipoic Acid 600 MG CAPS Take 600 mg by mouth daily.     Blood Glucose Monitoring Suppl (ONETOUCH VERIO) w/Device KIT Use as instructed to check blood sugar 1X daily 1 kit 0   cyclobenzaprine  (FLEXERIL ) 10 MG tablet at bedtime.   3   DULoxetine (CYMBALTA) 60 MG capsule Take 60 mg by mouth daily.   6   gabapentin  (NEURONTIN ) 300 MG capsule Take 1 capsule (300 mg total) by mouth in the morning AND 2 capsules (600 mg total) daily with lunch AND 3 capsules (900 mg total) at bedtime. 180 capsule 0   glucose blood (ONETOUCH VERIO) test strip Use as instructed to check blood sugar 1X daily 100 each 3   Lancets (ONETOUCH ULTRASOFT) lancets Use as instructed to check blood sugar 1X daily 100 each 3   levothyroxine  (SYNTHROID ) 75 MCG tablet  TAKE 1 TABLET DAILY BEFORE BREAKFAST 90 tablet 3   Meloxicam 10 MG CAPS Take 1 tablet by mouth daily.     metFORMIN  (GLUCOPHAGE ) 850 MG tablet TAKE 1 TABLET TWICE A DAY WITH MEALS 180 tablet 3   metoprolol (LOPRESSOR) 50 MG tablet Take 50 mg by mouth 2 (two) times daily.      Multiple Vitamins-Minerals (ALIVE MULTI-VITAMIN PO) Take by mouth.     OZEMPIC , 1 MG/DOSE, 4 MG/3ML SOPN INJECT 1 MG UNDER THE SKIN WEEKLY 9 mL 3   pravastatin  (PRAVACHOL ) 20 MG tablet TAKE 1 TABLET(20 MG) BY MOUTH DAILY 90 tablet 1   topiramate  (TOPAMAX ) 25 MG tablet Take 2 tablets (50 mg total) by mouth daily. Must keep follow up 07/20/23 for ongoing refills 180 tablet 0   VITAMIN D  PO Take by mouth daily.     No current facility-administered medications on file prior to visit.   No Known Allergies Family History  Problem Relation Age of Onset   Hypertension Mother    Hyperlipidemia Mother    Thyroid  disease Mother    Depression Mother    Anxiety disorder Mother    Sleep apnea Mother    Obesity Mother    Kidney cancer Mother 40   Heart disease Father    Diabetes Father    Hypertension Father    Hyperlipidemia Father    Obesity Father    Diabetes Maternal Grandmother    Hyperlipidemia Maternal Grandmother    Hyperlipidemia Maternal Grandfather    Hyperlipidemia Paternal Grandmother    Diabetes Paternal Grandmother    Cancer Paternal Grandmother    Heart disease Paternal Grandfather    PE: BP 122/78   Pulse 81   Ht 5\' 9"  (1.753 m)   Wt (!) 324 lb (147 kg)   SpO2 98%   BMI 47.85  kg/m  Wt Readings from Last 3 Encounters:  07/21/23 (!) 324 lb (147 kg)  01/20/23 (!) 319 lb 9.6 oz (145 kg)  07/22/22 (!) 320 lb 9.6 oz (145.4 kg)   Constitutional: overweight, in NAD Eyes:  EOMI, no exophthalmos ENT: no neck masses, no cervical lymphadenopathy Cardiovascular: RRR, No MRG Respiratory: CTA B Musculoskeletal: no deformities Skin:no rashes Neurological: no tremor with outstretched hands Diabetic Foot  Exam - Simple   Simple Foot Form Visual Inspection No deformities, no ulcerations, no other skin breakdown bilaterally: Yes Sensation Testing See comments: Yes Pulse Check Posterior Tibialis and Dorsalis pulse intact bilaterally: Yes Comments Decreased sensation to monofilament R foot, absent sensation in L toes and forefoot.    ASSESSMENT: 1.  Post ablative hypothyroidism -After RAI treatment for toxic adenoma  2. DM2, non-insulin -dependent,  with complications -Peripheral neuropathy  3.  Obesity class III  PLAN:  1. Patient with history of thyrotoxicosis with a diagnosis of left toxic adenoma, now status post RAI treatment in 11/2015, after which she developed postablative hypothyroidism - latest thyroid  labs reviewed with pt. >> normal: Lab Results  Component Value Date   TSH 1.57 08/22/2022  - she continues on LT4 75 mcg daily - pt feels good on this dose. - we discussed about taking the thyroid  hormone every day, with water, >30 minutes before breakfast, separated by >4 hours from acid reflux medications, calcium, iron, multivitamins. Pt. is taking it correctly. - will check thyroid  tests either here in the office in 2 to 3 weeks, or at the next appointment with PCP as she needs to come off biotin before taking these - If labs are abnormal, she will need to return for repeat TFTs in 1.5 months  3. DM2 -Patient with well-controlled type 2 diabetes, on metformin  and weekly GLP-1 receptor agonist.  HbA1c at last visit was lower, at goal, at 5.7%.  Sugars were mostly at goal.  We did not change her regimen. -at today's visit, she is not checking blood sugars.  We discussed why it is important to start checking.  I sent a prescription for test strips to [facility].  However, HbA1c is still at goal (see below). -I advised her to: Patient Instructions  Please continue: - Metformin  850 mg 2x a day  Please increase: - Ozempic  2 mg weekly  Please continue: - Levothyroxine   75 mcg daily.  Take the thyroid  hormone every day, with water, at least 30 minutes before breakfast, separated by at least 4 hours from: - acid reflux medications - calcium - iron - multivitamins  Please return in 6 months with your sugar log.   - we checked her HbA1c: 5.8% (still excellent) - advised to check sugars at different times of the day - 1x a day, rotating check times - advised for yearly eye exams >> she is UTD - return to clinic in 6 months  3.  Obesity class III - Will continue Ozempic  which should also help with weight loss - Continues Topamax  for headaches.  This can also help with weight loss - She lost 1 pound before last visit and gained 5 lbs since.  She needs a levothyroxine  refill.  Orders Placed This Encounter  Procedures   TSH   T4, free   Microalbumin / creatinine urine ratio   Emilie Harden, MD PhD [facility] Endocr[facility]

## 2023-07-21 NOTE — Addendum Note (Signed)
 Addended by: Vernon Goodpasture on: 07/21/2023 04:27 PM   Modules accepted: Orders

## 2023-07-22 ENCOUNTER — Ambulatory Visit: Payer: Self-pay | Admitting: Internal Medicine

## 2023-07-22 ENCOUNTER — Ambulatory Visit: Payer: Managed Care, Other (non HMO) | Admitting: Physical Therapy

## 2023-07-22 LAB — MICROALBUMIN / CREATININE URINE RATIO
Creatinine, Urine: 36 mg/dL (ref 20–275)
Microalb, Ur: <0.2 mg/dL

## 2023-07-27 ENCOUNTER — Telehealth: Payer: Self-pay | Admitting: Family Medicine

## 2023-07-27 MED ORDER — GABAPENTIN 300 MG PO CAPS: ORAL_CAPSULE | ORAL | 1 refills | Status: DC

## 2023-07-27 NOTE — Telephone Encounter (Signed)
 Pt called in regards to Schedule appt . Pt also called to request a medication refill.  gabapentin  (NEURONTIN ) 300 MG cap Medication is to be sent to  Pharmacy :  [facility] - [place], [place] - [address] (Ph: [phone])

## 2023-07-27 NOTE — Telephone Encounter (Signed)
 Last seen on 11/19/21 Follow up scheduled on 10/05/23 Rx sent.

## 2023-07-29 ENCOUNTER — Encounter: Payer: Managed Care, Other (non HMO) | Admitting: Physical Therapy

## 2023-08-28 ENCOUNTER — Other Ambulatory Visit: Payer: Self-pay | Admitting: Internal Medicine

## 2023-08-31 ENCOUNTER — Telehealth: Payer: Self-pay | Admitting: Pharmacy Technician

## 2023-08-31 ENCOUNTER — Other Ambulatory Visit (HOSPITAL_COMMUNITY): Payer: Self-pay

## 2023-08-31 NOTE — Telephone Encounter (Signed)
 Pharmacy Patient Advocate Encounter   Received notification from CoverMyMeds that prior authorization for Ozempic  (1 MG/DOSE) 4MG /3ML pen-injectors is due for renewal.   Insurance verification completed.   The patient is insured through [employer].  Action: Medication has been discontinued. Archived Key: [id number]

## 2023-09-09 ENCOUNTER — Telehealth: Payer: Self-pay

## 2023-09-09 NOTE — Telephone Encounter (Signed)
 Pt called in inquiring about refill request. Informed pt that her refill request was denied due to her not being seen in 1 year. Got pt scheduled to see Greig Forbes, NP on 09/18/2023 @ 8:00am.

## 2023-09-15 NOTE — Progress Notes (Signed)
 PATIENT: Katie Harris DOB: 04-Jun-1975  REASON FOR VISIT: follow up HISTORY FROM: patient  Chief Complaint  Patient presents with   Migraine    Rm10, alone, Migraine: 4/30 days w/migraines. Triggers: noise, light, stress.      HISTORY OF PRESENT ILLNESS:  09/18/23 ALL: Katie Harris returns for follow up for migraines, neuropathy and OSA on CPAP. She was last seen 11/2021. We continued topiramate  50mg  daily and gabapentin  300/600/900. Since, she reports doing well. May have 3-4 headache days a month. Headaches are relieved with Tylenol  and rest. Gabapentin  helps neuropathy. Mostly in feet but occasional pins/needle sensation in hands. She missed two days of gabapentin  recently due to needing a refill and noted worsening pain. She is tolerating meds well. A1C 5.8 07/2023. She is followed closely by PCP.   She reports several month history of sharp stabbing pain In low back/buttock with some radiation to pelvis. No weakness. Also noting pain in coccyx with prolonged seated position. She uses meloxicam for knee pain. She has not seen PCP.     11/19/21 ALL (mychart): Atianna returns for follow up for migraines. She was last seen 07/2021 and reported daily headaches with at least 5-6 migraines every month. We started her on started on Ajovy  and continued topiramate  and gabapentin . Since, she reports taking Ajovy  for 2 months. She was experiencing constipation so she stopped injections. She reports severity of headaches may be a little better. She continues to have daily headaches. Maybe 3-4 migraines. Tylenol  and rest abort migraines.   BP and CBGs have been normal. TSH has been stable. She drinks about 100 ounces of water daily. She usually sleeps well. Using CPAP daily. AHI well managed in 06/2021.   07/09/2021 ALL (mychart): Katie Harris is a 48 y.o. female here today for follow up for migraines and neuropathy (Ahern) and OSA on CPAP (Athar). She continues topiramate  50mg  daily and gabapentin   300/600/900. She reports headaches are unchanged. She has a headache most every day. She is having 5-6 migrainous headaches every month. Tylenol  and rest are usually effective to abort migraine. Neuropathy stable. She is using CPAP nightly for at least 4 hours. No concerns with machine or supplies.     07/09/2020 ALL:  She returns for follow up for migraines (Dr Ines) and OSA on CPAP (Dr Buck). She continues topiramate  50mg  daily and gabapentin  300/600/900. She feels that she is doing well. She does continues to have some sort of mild headache most days. She has about 2 severe migraines per month. She will take Tylenol  and rest and migraine usually resolves by the next day. She has tried abortive meds (unsure of which) in the past but did not like how they made her feel. She does not wish to add prevention meds.     She continues to do well with CPAP therapy. She is using therapy nightly. She does feel it helps with sleep. She denies concerns with machine or supplies.      07/07/2019 ALL:  Katie Harris is a 48 y.o. female here today for follow up of headaches and neuropathy. She continues topiramate . Dose decreased from 50 to 25mg  daily due to numbness and tingling of fingers but now feels it may have been more related to getting a COVID vaccine. She has resumed 50mg  dosing. Headaches are well managed. She may have 2-3 per month. .She continues gabapentin  300/600/900. She occasionally takes 300mg  at bedtime if neuropathy is bad. She continues CPAP nightly. BP has been normal.  Last A1C was 5.3. She is now on Ozepmpic. She has lost 45 pounds. Last eye exam 2 weeks ago and needed new lenses.    HISTORY: (copied from my note on 01/06/2019)   Katie Harris is a 49 y.o. female here today for follow up of headaches and neuropathy pain. We started her back on topiramate  in April. She has taken 50mg  at bedtime and feels that headaches have improved. She continues to have mild daily headaches. She  reports bifrontal retro orbital headaches. Feels pressure behind her eyes. She has not had any migraines/migraious symptoms over the past 4-5 months. She reports neuropathy has been stable. She has had more pain today than normal. She has had some tingling of right ring finger. This is not limiting her activity. She reports taking gabapentin  300mg  in am, 600mg  at 3p and 900mg  at bedtime. 600mg  TID ordered with Dr Ines in 06/2017. She is working closely with her endocrinologist for elevated CBG. Readings are consistently around 200. She was diagnosed with IBS recently. Blood pressures are usually 130's/90's. Today's reading was 156/100 and 144/92 on recheck.  She is doing very well with CPAP therapy at home.  She reports nightly compliance.  Compliance report reviewed from 11/05/2018 through 01/04/2019.  She is using CPAP therapy nightly and for greater than 4 hours each night.  Average usage is 8 hours and 48 minutes.  AHI is 1.9 on 5 to 13 cm of water and an EPR of 3.  There is no significant leak.      REVIEW OF SYSTEMS: Out of a complete 14 system review of symptoms, the patient complains only of the following symptoms, headaches, neuropathy, back and hip pain and all other reviewed systems are negative.  ESS:1 FSS:36  ALLERGIES: No Known Allergies  HOME MEDICATIONS: Outpatient Medications Prior to Visit  Medication Sig Dispense Refill   Blood Glucose Monitoring Suppl (ONETOUCH VERIO) w/Device KIT Use as instructed to check blood sugar 1X daily 1 kit 0   cyclobenzaprine  (FLEXERIL ) 10 MG tablet at bedtime.   3   DULoxetine (CYMBALTA) 60 MG capsule Take 60 mg by mouth daily.   6   glucose blood (ONETOUCH VERIO) test strip Use as instructed to check blood sugar 1X daily 100 each 3   Lancets (ONETOUCH ULTRASOFT) lancets Use as instructed to check blood sugar 1X daily 100 each 3   levothyroxine  (SYNTHROID ) 75 MCG tablet TAKE 1 TABLET DAILY BEFORE BREAKFAST 90 tablet 3   Meloxicam 10 MG CAPS Take 1  tablet by mouth daily.     metFORMIN  (GLUCOPHAGE ) 850 MG tablet TAKE 1 TABLET TWICE A DAY WITH MEALS 180 tablet 3   metoprolol (LOPRESSOR) 50 MG tablet Take 50 mg by mouth 2 (two) times daily.      Multiple Vitamins-Minerals (ALIVE MULTI-VITAMIN PO) Take by mouth.     pravastatin  (PRAVACHOL ) 20 MG tablet Take 1 tablet (20 mg total) by mouth daily. 90 tablet 3   Semaglutide , 2 MG/DOSE, (OZEMPIC , 2 MG/DOSE,) 8 MG/3ML SOPN Inject 2 mg into the skin once a week. 9 mL 3   VITAMIN D  PO Take by mouth daily.     gabapentin  (NEURONTIN ) 300 MG capsule Take 1 capsule (300 mg total) by mouth in the morning AND 2 capsules (600 mg total) daily with lunch AND 3 capsules (900 mg total) at bedtime. 180 capsule 1   topiramate  (TOPAMAX ) 25 MG tablet Take 2 tablets (50 mg total) by mouth daily. Must keep follow up 07/20/23 for  ongoing refills 180 tablet 0   Alpha-Lipoic Acid 600 MG CAPS Take 600 mg by mouth daily.     No facility-administered medications prior to visit.    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   HTN (hypertension), benign 04/21/2012   Hypertension    Hyperthyroidism    levels were abnormal in Dec- no treatment currently   Hyperthyroidism 04/21/2012   Infertility, female    Leg edema    PCOS (polycystic ovarian syndrome)    Peripheral vascular disease (HCC)    neuropathy bilateral feet   Prediabetes    Sleep apnea     PAST SURGICAL HISTORY: Past Surgical History:   Procedure Laterality Date   CESAREAN SECTION  2007   DILATATION & CURETTAGE/HYSTEROSCOPY WITH MYOSURE N/A 10/17/2016   Procedure: DILATATION & CURETTAGE/HYSTEROSCOPY WITH MYOSURE POLYPECTOMY;  Surgeon: Estelle Service, MD;  Location: [facility];  Service: Gynecology;  Laterality: N/A;  myosure rep will be here confirmed on 10/06/16   DILATION AND CURETTAGE OF UTERUS     polpectomy   INTRAUTERINE DEVICE (IUD) INSERTION N/A 10/17/2016   Procedure: INTRAUTERINE DEVICE (IUD) INSERTION MIRENA;  Surgeon: Estelle Service, MD;  Location: [facility];  Service: Gynecology;  Laterality: N/A;   TONSILLECTOMY      FAMILY HISTORY: Family History  Problem Relation Age of Onset   Hypertension Mother    Hyperlipidemia Mother    Thyroid  disease Mother    Depression Mother    Anxiety disorder Mother    Sleep apnea Mother    Obesity Mother    Kidney cancer Mother 10   Heart disease Father    Diabetes Father    Hypertension Father    Hyperlipidemia Father    Obesity Father    Diabetes Maternal Grandmother    Hyperlipidemia Maternal Grandmother    Hyperlipidemia Maternal Grandfather    Hyperlipidemia Paternal Grandmother    Diabetes Paternal Grandmother    Cancer Paternal Grandmother    Heart disease Paternal Grandfather     SOCIAL HISTORY: Social History   Socioeconomic History   Marital status: Married    Spouse name: Karah Caruthers   Number of children: 1   Years of education: Not on file   Highest education level: Some college, no degree  Occupational History   Occupation: Land  Tobacco Use   Smoking status: Never   Smokeless tobacco: Never  Vaping Use   Vaping status: Never Used  Substance and Sexual Activity   Alcohol use: No   Drug use: No   Sexual activity: Yes    Birth control/protection: None  Other Topics Concern   Not on file  Social History Narrative   Lives at home with her husband, child and dog    Right handed   No caffeine      Social Drivers of  Corporate investment banker Strain: Not  on file  Food Insecurity: Not on file  Transportation Needs: Not on file  Physical Activity: Not on file  Stress: Not on file  Social Connections: Not on file  Intimate Partner Violence: Not on file      PHYSICAL EXAM  Vitals:   09/18/23 0801  BP: 136/78  Pulse: 86  Weight: (!) 315 lb (142.9 kg)  Height: 5' 9 (1.753 m)    Body mass index is 46.52 kg/m.  Generalized: Well developed, in no acute distress  Cardiology: normal rate and rhythm, no murmur noted Respiratory: clear to auscultation bilaterally  Neurological examination  Mentation: Alert oriented to time, place, history taking. Follows all commands speech and language fluent Cranial nerve II-XII: Pupils were equal round reactive to light. Extraocular movements were full, visual field were full on confrontational test. Facial sensation and strength were normal.  Motor: The motor testing reveals 5 over 5 strength of all 4 extremities. Good symmetric motor tone is noted throughout.  Sensory: Sensory testing is intact to soft touch on all 4 extremities. No evidence of extinction is noted. Pinprick testing normal of bilateral lower extremities  Coordination: Cerebellar testing reveals good finger-nose-finger and heel-to-shin bilaterally.  Gait and station: Gait is normal.   DIAGNOSTIC DATA (LABS, IMAGING, TESTING) - I reviewed patient records, labs, notes, testing and imaging myself where available.      No data to display           Lab Results  Component Value Date   WBC 8.6 06/09/2019   HGB 13.3 06/09/2019   HCT 39.5 06/09/2019   MCV 88.4 06/09/2019   PLT 325.0 06/09/2019      Component Value Date/Time   NA 139 08/22/2022 1448   NA 137 09/08/2017 1021   K 4.3 08/22/2022 1448   CL 105 08/22/2022 1448   CO2 25 08/22/2022 1448   GLUCOSE 89 08/22/2022 1448   BUN 10 08/22/2022 1448   BUN 13 09/08/2017 1021   CREATININE 0.62 08/22/2022 1448   CALCIUM 9.3  08/22/2022 1448   PROT 6.6 08/22/2022 1448   PROT 7.0 09/08/2017 1021   ALBUMIN 4.3 06/11/2021 0833   ALBUMIN 4.1 09/08/2017 1021   AST 17 08/22/2022 1448   ALT 18 08/22/2022 1448   ALKPHOS 61 06/11/2021 0833   BILITOT 0.4 08/22/2022 1448   BILITOT 0.4 09/08/2017 1021   GFRNONAA 93 09/08/2017 1021   GFRAA 107 09/08/2017 1021   Lab Results  Component Value Date   CHOL 177 08/22/2022   HDL 42 (L) 08/22/2022   LDLCALC 108 (H) 08/22/2022   LDLDIRECT 143.0 04/21/2019   TRIG 157 (H) 08/22/2022   CHOLHDL 4.2 08/22/2022   Lab Results  Component Value Date   HGBA1C 5.8 (A) 07/21/2023   Lab Results  Component Value Date   VITAMINB12 398 04/23/2017   Lab Results  Component Value Date   TSH 1.57 08/22/2022     ASSESSMENT AND PLAN 48 y.o. year old female  has a past medical history of Anxiety, Arthritis, Back pain, Blood dyscrasia, Cancer (HCC), Depression, Diabetes mellitus without complication (HCC), Factor 5 Leiden mutation, heterozygous (HCC) (04/21/2012), Headache(784.0), HTN (hypertension), benign (04/21/2012), Hypertension, Hyperthyroidism, Hyperthyroidism (04/21/2012), Infertility, female, Leg edema, PCOS (polycystic ovarian syndrome), Peripheral vascular disease (HCC), Prediabetes, and Sleep apnea. here with     ICD-10-CM   1. Diabetic polyneuropathy associated with diabetes mellitus due to underlying condition (HCC)  E08.42     2. Chronic migraine without aura without status migrainosus, not intractable  G43.709 Home sleep test    3. OSA on CPAP  G47.33     4. Chronic left-sided low back pain with left-sided sciatica  M54.42    G89.29       Starleen continues to do well. Headaches and neuropathy are well managed. We will continue topiramate  50mg  daily and gabapentin  300mg  in am, 600mg  at lunch and 900mg  at bedtime. She may take an extra dose (300mg ) of gabapentin  at bedtime as needed. She may continue Tylenol  as needed for abortive therapy. She thinks triptans caused  significant side effects in past and prefers to avoid. May consider Nurtec or Ubrelvy if needed. Continue CPAP nightly and greater than 4 hours each night. Supply orders updated. HST ordered. She was encouraged to focus on healthy lifestyle habits with well balanced diet and regular exercise. She will continue close follow up with PCP and endocrinology. Consider PT for possible sciatica. Advised to discuss with PCP and let us  know if neurology input needed. Follow up with us  pending set up of new CPAP machine.     Orders Placed This Encounter  Procedures   Home sleep test    Standing Status:   Future    Expiration Date:   09/17/2024    Where should this test be performed::   [facility]     Meds ordered this encounter  Medications   gabapentin  (NEURONTIN ) 300 MG capsule    Sig: Take 1 capsule (300 mg total) by mouth in the morning AND 2 capsules (600 mg total) daily with lunch AND 3 capsules (900 mg total) at bedtime.    Dispense:  540 capsule    Refill:  3    Supervising Provider:   AHERN, ANTONIA B [[id number]]   topiramate  (TOPAMAX ) 25 MG tablet    Sig: Take 2 tablets (50 mg total) by mouth daily.    Dispense:  180 tablet    Refill:  3    Supervising Provider:   INES ONETHA NOVAK [[id number]]       Jfb Onfjk, FNP-C 09/18/2023, 9:33 AM [facility] [address], [address] [place], [place] [address] [phone]

## 2023-09-15 NOTE — Patient Instructions (Addendum)
 Below is our plan:  We will continue current treatment plan.   Please continue using your CPAP regularly. While your insurance requires that you use CPAP at least 4 hours each night on 70% of the nights, I recommend, that you not skip any nights and use it throughout the night if you can. Getting used to CPAP and staying with the treatment long term does take time and patience and discipline. Untreated obstructive sleep apnea when it is moderate to severe can have an adverse impact on cardiovascular health and raise her risk for heart disease, arrhythmias, hypertension, congestive heart failure, stroke and diabetes. Untreated obstructive sleep apnea causes sleep disruption, nonrestorative sleep, and sleep deprivation. This can have an impact on your day to day functioning and cause daytime sleepiness and impairment of cognitive function, memory loss, mood disturbance, and problems focussing. Using CPAP regularly can improve these symptoms.  We will update supply orders, today. You are eligible for a new machine. I will order a repeat sleep study that you will do at home. Please listen out for a call from our sleep lab staff to schedule. Once you complete study, our sleep doctors with evaluate the data and we will use that to order a new machine as indicated. This process could take a few weeks. Once new machine is ordered, you will hear back from your DME company to schedule set up of your new machine.   We will need to see you back within 31-90 days following set up of your new CPAP to document compliance as required by your insurance company. Please call the office to schedule follow up when you receive your new machine. Please feel free to reach out with any questions or concerns.    Please make sure you are staying well hydrated. I recommend 50-60 ounces daily. Well balanced diet and regular exercise encouraged. Consistent sleep schedule with 6-8 hours recommended.   Please continue follow up with  care team as directed.   Follow up with me pending set up of new CPAP.   You may receive a survey regarding today's visit. I encourage you to leave honest feed back as I do use this information to improve patient care. Thank you for seeing me today!   GENERAL HEADACHE INFORMATION:   Natural supplements: Magnesium Oxide or Magnesium Glycinate 500 mg at bed (up to 800 mg daily) Coenzyme Q10 300 mg in AM Vitamin B2- 200 mg twice a day   Add 1 supplement at a time since even natural supplements can have undesirable side effects. You can sometimes buy supplements cheaper (especially Coenzyme Q10) at www.WebmailGuide.co.za or at [facility].  Migraine with aura: There is increased risk for stroke in women with migraine with aura and a contraindication for the combined contraceptive pill for use by women who have migraine with aura. The risk for women with migraine without aura is lower. However other risk factors like smoking are far more likely to increase stroke risk than migraine. There is a recommendation for no smoking and for the use of OCPs without estrogen such as progestogen only pills particularly for women with migraine with aura.SABRA People who have migraine headaches with auras may be 3 times more likely to have a stroke caused by a blood clot, compared to migraine patients who don't see auras. Women who take hormone-replacement therapy may be 30 percent more likely to suffer a clot-based stroke than women not taking medication containing estrogen. Other risk factors like smoking and high blood pressure may  be  much more important.    Vitamins and herbs that show potential:   Magnesium: Magnesium (250 mg twice a day or 500 mg at bed) has a relaxant effect on smooth muscles such as blood vessels. Individuals suffering from frequent or daily headache usually have low magnesium levels which can be increase with daily supplementation of 400-750 mg. Three trials found 40-90% average headache reduction  when  used as a preventative. Magnesium may help with headaches are aura, the best evidence for magnesium is for migraine with aura is its thought to stop the cortical spreading depression we believe is the pathophysiology of migraine aura.Magnesium also demonstrated the benefit in menstrually related migraine.  Magnesium is part of the messenger system in the serotonin cascade and it is a good muscle relaxant.  It is also useful for constipation which can be a side effect of other medications used to treat migraine. Good sources include nuts, whole grains, and tomatoes. Side Effects: loose stool/diarrhea  Riboflavin (vitamin B 2) 200 mg twice a day. This vitamin assists nerve cells in the production of ATP a principal energy storing molecule.  It is necessary for many chemical reactions in the body.  There have been at least 3 clinical trials of riboflavin using 400 mg per day all of which suggested that migraine frequency can be decreased.  All 3 trials showed significant improvement in over half of migraine sufferers.  The supplement is found in bread, cereal, milk, meat, and poultry.  Most Americans get more riboflavin than the recommended daily allowance, however riboflavin deficiency is not necessary for the supplements to help prevent headache. Side effects: energizing, green urine   Coenzyme Q10: This is present in almost all cells in the body and is critical component for the conversion of energy.  Recent studies have shown that a nutritional supplement of CoQ10 can reduce the frequency of migraine attacks by improving the energy production of cells as with riboflavin.  Doses of 150 mg twice a day have been shown to be effective.   Melatonin: Increasing evidence shows correlation between melatonin secretion and headache conditions.  Melatonin supplementation has decreased headache intensity and duration.  It is widely used as a sleep aid.  Sleep is natures way of dealing with migraine.  A dose of 3 mg is  recommended to start for headaches including cluster headache. Higher doses up to 15 mg has been reviewed for use in Cluster headache and have been used. The rationale behind using melatonin for cluster is that many theories regarding the cause of Cluster headache center around the disruption of the normal circadian rhythm in the brain.  This helps restore the normal circadian rhythm.   HEADACHE DIET: Foods and beverages which may trigger migraine Note that only 20% of headache patients are food sensitive. You will know if you are food sensitive if you get a headache consistently 20 minutes to 2 hours after eating a certain food. Only cut out a food if it causes headaches, otherwise you might remove foods you enjoy! What matters most for diet is to eat a well balanced healthy diet full of vegetables and low fat protein, and to not miss meals.   Chocolate, other sweets ALL cheeses except cottage and cream cheese Dairy products, yogurt, sour cream, ice cream Liver Meat extracts (Bovril, Marmite, meat tenderizers) Meats or fish which have undergone aging, fermenting, pickling or smoking. These include: Hotdogs,salami,Lox,sausage, mortadellas,smoked salmon, pepperoni, Pickled herring Pods of broad bean (English beans, Chinese pea pods,  [place] (fava) beans, lima and navy beans Ripe avocado, ripe banana Yeast extracts or active yeast preparations such as Brewer's or Fleishman's (commercial bakes goods are permitted) Tomato based foods, pizza (lasagna, etc.)   MSG (monosodium glutamate) is disguised as many things; look for these common aliases: Monopotassium glutamate Autolysed yeast Hydrolysed protein Sodium caseinate "flavorings" "all natural preservatives Nutrasweet   Avoid all other foods that convincingly provoke headaches.   Resources: The Dizzy Bluford Aid Your Headache Diet, migrainestrong.com  https://zamora-andrews.com/   Caffeine and  Migraine For patients that have migraine, caffeine intake more than 3 days per week can lead to dependency and increased migraine frequency. I would recommend cutting back on your caffeine intake as best you can. The recommended amount of caffeine is 200-300 mg daily, although migraine patients may experience dependency at even lower doses. While you may notice an increase in headache temporarily, cutting back will be helpful for headaches in the long run. For more information on caffeine and migraine, visit: https://americanmigrainefoundation.org/resource-library/caffeine-and-migraine/   Headache Prevention Strategies:   1. Maintain a headache diary; learn to identify and avoid triggers.  - This can be a simple note where you log when you had a headache, associated symptoms, and medications used - There are several smartphone apps developed to help track migraines: Migraine Buddy, Migraine Monitor, Curelator N1-Headache App   Common triggers include: Emotional triggers: Emotional/Upset family or friends Emotional/Upset occupation Business reversal/success Anticipation anxiety Crisis-serious Post-crisis periodNew job/position   Physical triggers: Vacation Day Weekend Strenuous Exercise High Altitude Location New Move Menstrual Day Physical Illness Oversleep/Not enough sleep Weather changes Light: Photophobia or light sesnitivity treatment involves a balance between desensitization and reduction in overly strong input. Use dark polarized glasses outside, but not inside. Avoid bright or fluorescent light, but do not dim environment to the point that going into a normally lit room hurts. Consider FL-41 tint lenses, which reduce the most irritating wavelengths without blocking too much light.  These can be obtained at axonoptics.com or theraspecs.com Foods: see list above.   2. Limit use of acute treatments (over-the-counter medications, triptans, etc.) to no more than 2 days per week or  10 days per month to prevent medication overuse headache (rebound headache).     3. Follow a regular schedule (including weekends and holidays): Don't skip meals. Eat a balanced diet. 8 hours of sleep nightly. Minimize stress. Exercise 30 minutes per day. Being overweight is associated with a 5 times increased risk of chronic migraine. Keep well hydrated and drink 6-8 glasses of water per day.   4. Initiate non-pharmacologic measures at the earliest onset of your headache. Rest and quiet environment. Relax and reduce stress. Breathe2Relax is a free app that can instruct you on    some simple relaxtion and breathing techniques. Http://Dawnbuse.com is a    free website that provides teaching videos on relaxation.  Also, there are  many apps that   can be downloaded for "mindful" relaxation.  An app called YOGA NIDRA will help walk you through mindfulness. Another app called Calm can be downloaded to give you a structured mindfulness guide with daily reminders and skill development. Headspace for guided meditation Mindfulness Based Stress Reduction Online Course: www.palousemindfulness.com Cold compresses.   5. Don't wait!! Take the maximum allowable dosage of prescribed medication at the first sign of migraine.   6. Compliance:  Take prescribed medication regularly as directed and at the first sign of a migraine.   7. Communicate:  Call your physician when problems arise,  especially if your headaches change, increase in frequency/severity, or become associated with neurological symptoms (weakness, numbness, slurred speech, etc.). Proceed to emergency room if you experience new or worsening symptoms or symptoms do not resolve, if you have new neurologic symptoms or if headache is severe, or for any concerning symptom.   8. Headache/pain management therapies: Consider various complementary methods, including medication, behavioral therapy, psychological counselling, biofeedback, massage therapy,  acupuncture, dry needling, and other modalities.  Such measures may reduce the need for medications. Counseling for pain management, where patients learn to function and ignore/minimize their pain, seems to work very well.   9. Recommend changing family's attention and focus away from patient's headaches. Instead, emphasize daily activities. If first question of day is 'How are your headaches/Do you have a headache today?', then patient will constantly think about headaches, thus making them worse. Goal is to re-direct attention away from headaches, toward daily activities and other distractions.   10. Helpful Websites: www.AmericanHeadacheSociety.org PatentHood.ch www.headaches.org TightMarket.nl www.achenet.org

## 2023-09-18 ENCOUNTER — Ambulatory Visit (INDEPENDENT_AMBULATORY_CARE_PROVIDER_SITE_OTHER): Admitting: Family Medicine

## 2023-09-18 ENCOUNTER — Encounter: Payer: Self-pay | Admitting: Family Medicine

## 2023-09-18 VITALS — BP 136/78 | HR 86 | Ht 69.0 in | Wt 315.0 lb

## 2023-09-18 DIAGNOSIS — M5442 Lumbago with sciatica, left side: Secondary | ICD-10-CM

## 2023-09-18 DIAGNOSIS — G43709 Chronic migraine without aura, not intractable, without status migrainosus: Secondary | ICD-10-CM | POA: Diagnosis not present

## 2023-09-18 DIAGNOSIS — G8929 Other chronic pain: Secondary | ICD-10-CM

## 2023-09-18 DIAGNOSIS — G4733 Obstructive sleep apnea (adult) (pediatric): Secondary | ICD-10-CM

## 2023-09-18 DIAGNOSIS — E0842 Diabetes mellitus due to underlying condition with diabetic polyneuropathy: Secondary | ICD-10-CM

## 2023-09-18 MED ORDER — TOPIRAMATE 25 MG PO TABS: 50.0000 mg | ORAL_TABLET | Freq: Every day | ORAL | 3 refills | Status: AC

## 2023-09-18 MED ORDER — GABAPENTIN 300 MG PO CAPS: ORAL_CAPSULE | ORAL | 3 refills | Status: AC

## 2023-09-25 ENCOUNTER — Ambulatory Visit (INDEPENDENT_AMBULATORY_CARE_PROVIDER_SITE_OTHER): Admitting: Neurology

## 2023-09-25 DIAGNOSIS — G4733 Obstructive sleep apnea (adult) (pediatric): Secondary | ICD-10-CM

## 2023-09-25 DIAGNOSIS — G43709 Chronic migraine without aura, not intractable, without status migrainosus: Secondary | ICD-10-CM

## 2023-10-05 ENCOUNTER — Ambulatory Visit: Admitting: Family Medicine

## 2023-10-22 NOTE — Progress Notes (Signed)
 See procedure note.

## 2023-10-23 NOTE — Procedures (Signed)
 [facility]  HOME SLEEP TEST (SANSA) REPORT (Mail-Out Device):   STUDY DATE: 10/11/2023  DOB: 05-27-1975  MRN: [id number]  ORDERING CLINICIAN: True Mar, MD, PhD   REFERRING CLINICIAN: Greig Forbes, NP  CLINICAL INFORMATION/HISTORY: 48 year old female with an underlying medical history of diabetes, migraines, low back pain, depression, anxiety, factor V Leiden, hypertension, thyroid  disease, sleep apnea, on PAP therapy, and morbid obesity with a BMI of over 45, who presents for evaluation of her obstructive sleep apnea.  She has been compliant with her AutoPap of 5 to 13 cm with EPR of 3.  She has good apnea control and tolerance of treatment.  She should be eligible for new machine.  BMI (at the time of sleep clinic visit and/or test date): 46.5 kg/m  FINDINGS:   Study Protocol:    The SANSA single-point-of-skin-contact chest-worn sensor - an FDA cleared and DOT approved type 4 home sleep test device - measures eight physiological channels,  including blood oxygen saturation (measured via PPG [photoplethysmography]), EKG-derived heart rate, respiratory effort, chest movement (measured via accelerometer), snoring, body position, and actigraphy. The device is designed to be worn for up to 10 hours per study.   Sleep Summary:   Total Recording Time (hours, min): 8 hours, 59 min  Total Effective Sleep Time (hours, min):  4 hours, 49 min  Sleep Efficiency (%):    54%   Respiratory Indices:   Calculated sAHI (per hour):  11.8/hour         Oxygen Saturation Statistics:    Oxygen Saturation (%) Mean: 94.2%   Minimum oxygen saturation (%):                 75.9%   O2 Saturation Range (%): 75.9-99%   Time below or at 88% saturation: 3 min   Pulse Rate Statistics:   Pulse Mean (bpm):    82/min    Pulse Range (72-111/min)   Snoring: Mild to moderate  IMPRESSION/DIAGNOSES:   OSA (obstructive sleep apnea)     RECOMMENDATIONS:   This home sleep test  demonstrates overall mild obstructive sleep apnea with a total AHI of 11.8/hour, O2 nadir of 75.9% with mild to moderate snoring detected.  The patient has been compliant with her current AutoPap machine with good tolerance and good apnea control.  She should be eligible for a new equipment.  I would recommend keeping her current settings the same, mask of choice, sized to fit.  A full night, in-lab PAP titration study may aid in improving proper treatment settings and with mask fit, if needed, down the road. Alternative treatments may include weight loss (where appropriate) along with avoidance of the supine sleep position (if possible), or an oral appliance in appropriate candidates.   Please note that untreated obstructive sleep apnea may carry additional perioperative morbidity. Patients with significant obstructive sleep apnea should receive perioperative PAP therapy and the surgeons and particularly the anesthesiologist should be informed of the diagnosis and the severity of the sleep disordered breathing. The patient should be cautioned not to drive, work at heights, or operate dangerous or heavy equipment when tired or sleepy. Review and reiteration of good sleep hygiene measures should be pursued with any patient. Other causes of the patient's symptoms, including circadian rhythm disturbances, an underlying mood disorder, medication effect and/or an underlying medical problem cannot be ruled out based on this test. Clinical correlation is recommended.  The patient and her referring provider will be notified of the test results. The patient will  be seen in follow up in sleep clinic at [facility], as necessary.  I certify that I have reviewed the raw data recording prior to the issuance of this report in accordance with the standards of the American Academy of Sleep Medicine (AASM).    INTERPRETING PHYSICIAN:   True Mar, MD, PhD Medical Director, [facility]  [facility]) Diplomat, ABPN (Neurology and Sleep)   [facility] [address], [address] [place], [place] [address] [phone]

## 2023-10-27 ENCOUNTER — Encounter: Payer: Self-pay | Admitting: Family Medicine

## 2023-10-27 NOTE — Addendum Note (Signed)
 Addended by: CARY NO L on: 10/27/2023 04:31 PM   Modules accepted: Orders

## 2023-11-24 ENCOUNTER — Telehealth: Payer: Self-pay | Admitting: *Deleted

## 2023-11-24 NOTE — Telephone Encounter (Signed)
 Phone room: please call to schedule appt between 12/25/23-02/22/24

## 2023-11-25 NOTE — Telephone Encounter (Signed)
 Pt has been scheduled for her initial CPAP f/u for 02/18/24, pt aware to arrive at 7:15, pt aware to bring CPAP and power cord . Phone rep advised to make needed appointment between 12/25/23-02/22/24

## 2023-12-21 ENCOUNTER — Telehealth: Payer: Self-pay

## 2023-12-21 ENCOUNTER — Other Ambulatory Visit (HOSPITAL_COMMUNITY): Payer: Self-pay

## 2023-12-21 NOTE — Telephone Encounter (Signed)
 Pt needs a PA for Ozempic . She has been out x2 weeks.

## 2023-12-21 NOTE — Telephone Encounter (Signed)
 Pharmacy Patient Advocate Encounter   Received notification from Pt Calls Messages that prior authorization for Ozempic  8mg /67ml is required/requested.   Insurance verification completed.   The patient is insured through [employer].   Per test claim: PA required; PA submitted to above mentioned insurance via Latent Key/confirmation #/EOC [id number] Status is pending

## 2023-12-22 ENCOUNTER — Other Ambulatory Visit: Payer: Self-pay

## 2024-01-22 ENCOUNTER — Ambulatory Visit: Admitting: Internal Medicine

## 2024-01-22 ENCOUNTER — Encounter: Payer: Self-pay | Admitting: Internal Medicine

## 2024-01-22 VITALS — BP 138/70 | HR 87 | Ht 69.0 in | Wt 324.6 lb

## 2024-01-22 DIAGNOSIS — E66813 Obesity, class 3: Secondary | ICD-10-CM

## 2024-01-22 DIAGNOSIS — Z6841 Body Mass Index (BMI) 40.0 and over, adult: Secondary | ICD-10-CM | POA: Diagnosis not present

## 2024-01-22 DIAGNOSIS — E119 Type 2 diabetes mellitus without complications: Secondary | ICD-10-CM

## 2024-01-22 DIAGNOSIS — E890 Postprocedural hypothyroidism: Secondary | ICD-10-CM | POA: Diagnosis not present

## 2024-01-22 DIAGNOSIS — Z7985 Long-term (current) use of injectable non-insulin antidiabetic drugs: Secondary | ICD-10-CM

## 2024-01-22 DIAGNOSIS — Z7984 Long term (current) use of oral hypoglycemic drugs: Secondary | ICD-10-CM

## 2024-01-22 NOTE — Progress Notes (Signed)
 Patient ID: Katie Harris, female   DOB: 10-08-1975, 48 y.o.   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 Last visit 6 months ago.  Interim history: No increased urination, chest pain, nausea.  She was off Ozempic  for almost 2 months  - insurance pbs - now [place] approved.  She was able to restart it.  She had vomiting and she restarted it at the target dose.  However, now tolerated well.  Reviewed history: Pt was diagnosed with thyrotoxicosis in 11/2014. At that time, she was taking several thyrotoxic supplements and also took Biotin.  Before our first visit, she was on biotin, B6 vitamin, [place], but was previously on [place], Coleus Forte - stopped 3-4 weeks prior to the appt. I advised her to stay off all the supplements and hold the Biotin few days before next TFTs.  Her TSI antibodies were not elevated.   TFTs continued to fluctuate but she continued to have suppressed TSH and elevated free T3.  Thyroid  Uptake and scan which she had on 10/25/2015: Elevated 24 hour radio iodine uptake of 42%.  Large hot nodule within LEFT thyroid  lobe with suppression of uptake within the RIGHT lobe consistent with a toxic adenoma.   11/09/2015: RAI treatment  She developed post ablative hypothyroidism afterwards.  She is on levothyroxine  75 mcg daily.  She takes this: - in am - fasting - at least 30 min from b'fast - no Ca, Fe, PPIs - + MVIs in the pm - on Biotin in Hair Skin and Nails (2500 mcg) - she forgot to stop this before the appointment today.  Reviewed her TFTs: 07/31/2023: TSH 1.61 Lab Results  Component Value Date   TSH 1.57 08/22/2022   TSH 1.54 06/11/2021   TSH 1.26 12/17/2020   TSH 1.50 06/08/2020   TSH 1.96 11/21/2019   TSH 2.57 01/14/2019   TSH 3.71 05/10/2018   TSH  3.35 12/22/2017   TSH 6.830 (H) 09/08/2017   TSH 4.780 (H) 04/23/2017   FREET4 0.9 08/22/2022   FREET4 0.77 06/11/2021   FREET4 0.79 12/17/2020   FREET4 0.81 06/08/2020   FREET4 0.68 11/21/2019   FREET4 1.0 01/14/2019   FREET4 0.95 05/10/2018   FREET4 0.84 12/22/2017   FREET4 0.90 09/08/2017   FREET4 0.95 04/23/2017   T3FREE 3.3 07/29/2016   T3FREE 2.7 03/31/2016   T3FREE 3.8 12/21/2015   T3FREE 4.5 (H) 10/15/2015   T3FREE 5.0 (H) 08/31/2015   T3FREE 5.2 (H) 07/19/2015  11/07/2014: TSH <0.01, fT4 1.54 (0.61-1.18)  TSI's were not elevated: Lab Results  Component Value Date   TSI <89 07/19/2015   Pt denies: - feeling nodules in neck - hoarseness - dysphagia - choking  Pt does have a FH of thyroid  ds: hypothyroidism in mother, possibly MGM. No FH of thyroid  cancer. No h/o radiation tx to head or neck other than RAI treatment. No herbal supplements. No recent steroids use.   She also has a history of OSA, PCOS, and pseudotumor cerebri. She has a history of IBS and has abdominal pain and diarrhea after higher fiber meals.   DM2: - dx'ed 2019  Reviewed HbA1c levels: Lab Results  Component Value Date   HGBA1C 5.8 (A) 07/21/2023   HGBA1C 5.7 (A) 01/20/2023   HGBA1C 5.8 (A) 07/22/2022   HGBA1C 5.8 (A) 12/11/2021   HGBA1C  5.7 (A) 06/11/2021   HGBA1C 5.8 (A) 12/11/2020   HGBA1C 5.7 (A) 06/08/2020   HGBA1C 5.5 11/21/2019   HGBA1C 5.8 (A) 04/21/2019   HGBA1C 7.9 (A) 01/14/2019   She is currently on: - Metformin  500 mg >> 850 mg 2x a day   - Ozempic  0.5 mg weekly - started 01/2019 >> 1 >> 2 mg weekly  She was checking sugars 0 to 1x a day: - am: 120-135, 140 >> 118-131,161 >> 122 >> 145-212 - 2h after b'fast: n/c >> 118, 138 >> 183, 203 >> n/c - lunch: 134 >> n/c >> 112 >> 105-140 >> 109 >> 106-188 - 2h after lunch:100-130 >>  110-144 >> 155 >> 107-142 - dinner: 119, 128, 133 >> n/c >> 96, 110 >> 120 >> 125, 148 - 2h after dinner: 204 >> n/c >> 128 >> n/c -  bedtime: 182 >> n/c Lowest: 96 >> 84 >> 106 Highest: 200 x1 >> 203 after protein shake >> 180 >> 212.  Normal kidney function:  01/14/2023: 9/0.62, GFR 109, glucose 118 Lab Results  Component Value Date   BUN 10 08/22/2022   Lab Results  Component Value Date   CREATININE 0.62 08/22/2022   CREATININE 0.68 06/11/2021   CREATININE 0.62 06/09/2019   CREATININE 0.70 04/21/2019   CREATININE 0.78 09/08/2017   CREATININE 0.71 04/23/2017   CREATININE 0.68 01/06/2017   CREATININE 0.62 10/09/2016   CREATININE 0.65 07/09/2016   CREATININE 0.62 03/22/2012   Lab Results  Component Value Date   MICRALBCREAT NOTE 07/21/2023   MICRALBCREAT NOTE 08/22/2022   + HL: Lab Results  Component Value Date   CHOL 177 08/22/2022   HDL 42 (L) 08/22/2022   LDLCALC 108 (H) 08/22/2022   LDLDIRECT 143.0 04/21/2019   TRIG 157 (H) 08/22/2022   CHOLHDL 4.2 08/22/2022  She was found to have fatty liver on abdominal ultrasound from 09/28/2020. She is on pravastatin  20 mg daily.  Last eye exam was in 06/29/2023: No DR reportedly.  Last foot exam 07/22/2022. She is on alpha-lipoic acid (in MVI) and Neurontin .  ROS:  + see HPI  I reviewed pt's medications, allergies, PMH, social hx, family hx, and changes were documented in the history of present illness. Otherwise, unchanged from my initial visit note.  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    Cancer (HCC)    basal cell removed from left hairline area   Depression    Diabetes mellitus without complication (HCC)    recent A1C 6.5 no meds presently   Factor 5 Leiden mutation, heterozygous 04/21/2012   Lab 04/16/12 in view of positive Leiden in her father; no personal hx of thrombosis despite prior pregnancy & C-section 2007   Headache(784.0)    chronic due to pseudo tumor cerebri   HTN (hypertension), benign 04/21/2012   Hypertension    Hyperthyroidism     levels were abnormal in Dec- no treatment currently   Hyperthyroidism 04/21/2012   Infertility, female    Leg edema    PCOS (polycystic ovarian syndrome)    Peripheral vascular disease    neuropathy bilateral feet   Prediabetes    Sleep apnea    Past Surgical History:  Procedure Laterality Date   CESAREAN SECTION  2007   DILATATION & CURETTAGE/HYSTEROSCOPY WITH MYOSURE N/A 10/17/2016   Procedure: DILATATION & CURETTAGE/HYSTEROSCOPY WITH  MYOSURE POLYPECTOMY;  Surgeon: Estelle Service, MD;  Location: [facility];  Service: Gynecology;  Laterality: N/A;  myosure rep will be here confirmed on 10/06/16   DILATION AND CURETTAGE OF UTERUS     polpectomy   INTRAUTERINE DEVICE (IUD) INSERTION N/A 10/17/2016   Procedure: INTRAUTERINE DEVICE (IUD) INSERTION MIRENA;  Surgeon: Estelle Service, MD;  Location: [facility];  Service: Gynecology;  Laterality: N/A;   TONSILLECTOMY     Social History   Social History   Marital Status: Married    Spouse Name: N/A   Number of Children: 1   Occupational History   Land   Social History Main Topics   Smoking status: Never Smoker    Smokeless tobacco: Not on file   Alcohol Use: No   Drug Use: No   Current Outpatient Medications on File Prior to Visit  Medication Sig Dispense Refill   Blood Glucose Monitoring Suppl (ONETOUCH VERIO) w/Device KIT Use as instructed to check blood sugar 1X daily 1 kit 0   cyclobenzaprine  (FLEXERIL ) 10 MG tablet at bedtime.   3   DULoxetine (CYMBALTA) 60 MG capsule Take 60 mg by mouth daily.   6   gabapentin  (NEURONTIN ) 300 MG capsule Take 1 capsule (300 mg total) by mouth in the morning AND 2 capsules (600 mg total) daily with lunch AND 3 capsules (900 mg total) at bedtime. 540 capsule 3   glucose blood (ONETOUCH VERIO) test strip Use as instructed to check blood sugar 1X daily 100 each 3   Lancets (ONETOUCH ULTRASOFT) lancets Use as instructed to check blood sugar 1X daily 100 each 3   levothyroxine   (SYNTHROID ) 75 MCG tablet TAKE 1 TABLET DAILY BEFORE BREAKFAST 90 tablet 3   Meloxicam 10 MG CAPS Take 1 tablet by mouth daily.     metFORMIN  (GLUCOPHAGE ) 850 MG tablet TAKE 1 TABLET TWICE A DAY WITH MEALS 180 tablet 3   metoprolol (LOPRESSOR) 50 MG tablet Take 50 mg by mouth 2 (two) times daily.      Multiple Vitamins-Minerals (ALIVE MULTI-VITAMIN PO) Take by mouth.     pravastatin  (PRAVACHOL ) 20 MG tablet Take 1 tablet (20 mg total) by mouth daily. 90 tablet 3   Semaglutide , 2 MG/DOSE, (OZEMPIC , 2 MG/DOSE,) 8 MG/3ML SOPN Inject 2 mg into the skin once a week. 9 mL 3   topiramate  (TOPAMAX ) 25 MG tablet Take 2 tablets (50 mg total) by mouth daily. 180 tablet 3   VITAMIN D  PO Take by mouth daily.     No current facility-administered medications on file prior to visit.   No Known Allergies Family History  Problem Relation Age of Onset   Hypertension Mother    Hyperlipidemia Mother    Thyroid  disease Mother    Depression Mother    Anxiety disorder Mother    Sleep apnea Mother    Obesity Mother    Kidney cancer Mother 68   Heart disease Father    Diabetes Father    Hypertension Father    Hyperlipidemia Father    Obesity Father    Diabetes Maternal Grandmother    Hyperlipidemia Maternal Grandmother    Hyperlipidemia Maternal Grandfather    Hyperlipidemia Paternal Grandmother    Diabetes Paternal Grandmother    Cancer Paternal Grandmother    Heart disease Paternal Grandfather    PE: BP 138/70   Pulse 87   Ht 5' 9 (1.753 m)   Wt (!) 324 lb 9.6 oz (147.2 kg)   SpO2 96%   BMI  47.94 kg/m  Wt Readings from Last 3 Encounters:  01/22/24 (!) 324 lb 9.6 oz (147.2 kg)  09/18/23 (!) 315 lb (142.9 kg)  07/21/23 (!) 324 lb (147 kg)   Constitutional: overweight, in NAD Eyes:  EOMI, no exophthalmos ENT: no neck masses, no cervical lymphadenopathy Cardiovascular: RRR, No MRG Respiratory: CTA B Musculoskeletal: no deformities Skin:no rashes Neurological: no tremor with outstretched  hands Diabetic Foot Exam - Simple   Simple Foot Form Diabetic Foot exam was performed with the following findings: Yes 01/22/2024  4:27 PM  Visual Inspection No deformities, no ulcerations, no other skin breakdown bilaterally: Yes Sensation Testing Intact to touch and monofilament testing bilaterally: Yes See comments: Yes Pulse Check Posterior Tibialis and Dorsalis pulse intact bilaterally: Yes Comments No sensation to monofilament in R foot, decreased sensation in L foot up to ankle    ASSESSMENT: 1.  Post ablative hypothyroidism -After RAI treatment for toxic adenoma  2. DM2, non-insulin -dependent,  with complications -Peripheral neuropathy  3.  Obesity class III  PLAN:  1. Patient with history of thyrotoxicosis with a diagnosis of left toxic adenoma, now status post RAI treatment in 11/2015, after which she developed postablative hypothyroidism - latest thyroid  labs reviewed with pt. >> normal: Lab Results  Component Value Date   TSH 1.57 08/22/2022  - she continues on LT4 75 mcg daily - pt feels good on this dose. - we discussed about taking the thyroid  hormone every day, with water, >30 minutes before breakfast, separated by >4 hours from acid reflux medications, calcium, iron, multivitamins. Pt. is taking it correctly. - will check thyroid  tests at next lab draw as she forgot to stop the biotin at today's visit: TSH and fT4.  I advised her to stay off biotin for at least 3 days before checking the labs - I plan to see her back in 6 months  3. DM2 -Patient with well-controlled type 2 diabetes, on metformin  and weekly GLP-1 receptor agonist, increased at last visit.  At last visit she was not checking blood sugars and we discussed about the importance of doing so.  HbA1c was still at goal.  -At today's visit, she mentions that she was off Ozempic  for almost 2 months and sugars increased in this period of time.  She was able to started back and they started to improve.  The  HbA1c, however, is higher, reflecting coming off the GLP-1 receptor agonist.  However, since she is now back on this, we can stay on the same medication doses. -I advised her to: Patient Instructions  Please continue: - Metformin  850 mg 2x a day - Ozempic  2 mg weekly  Please continue: - Levothyroxine  75 mcg daily.  Take the thyroid  hormone every day, with water, at least 30 minutes before breakfast, separated by at least 4 hours from: - acid reflux medications - calcium - iron - multivitamins  Please come back for labs fasting.  Please return in 6 months with your sugar log.   - we checked her HbA1c: 6.4% (higher) - advised to check sugars at different times of the day - 1x a day, rotating check times - advised for yearly eye exams >> she is UTD - she is due for lipid panel-I advised her to return for this fasting. - we checked her foot exam today.  She has significantly decreased sensation to monofilament, which she mentions started before being diagnosed with diabetes. - return to clinic in 6 months  3.  Obesity class III - Continue  Ozempic  which should also help with weight loss.  The dose was increased at last visit. - Continues Topamax  for headaches.  This can also help with weight loss - She gained 5 pounds before last visit and weight is stable at today's visit  Orders Placed This Encounter  Procedures   TSH   T4, free   Lipid Panel w/reflex Direct LDL   Lela Fendt, MD PhD [facility]

## 2024-01-22 NOTE — Patient Instructions (Addendum)
 Please continue: - Metformin  850 mg 2x a day - Ozempic  2 mg weekly  Please continue: - Levothyroxine  75 mcg daily.  Take the thyroid  hormone every day, with water, at least 30 minutes before breakfast, separated by at least 4 hours from: - acid reflux medications - calcium - iron - multivitamins  Come back for labs fasting.  Please return in 6 months with your sugar log.

## 2024-02-18 ENCOUNTER — Encounter: Payer: Self-pay | Admitting: Neurology

## 2024-02-18 ENCOUNTER — Ambulatory Visit: Admitting: Neurology

## 2024-02-18 VITALS — BP 133/60 | HR 80 | Ht 69.0 in | Wt 314.0 lb

## 2024-02-18 DIAGNOSIS — K5909 Other constipation: Secondary | ICD-10-CM

## 2024-02-18 DIAGNOSIS — G4733 Obstructive sleep apnea (adult) (pediatric): Secondary | ICD-10-CM

## 2024-02-18 NOTE — Progress Notes (Signed)
 Subjective:    Patient ID: Katie Harris is a 48 y.o. female.  HPI    Interim history:   Katie Harris is a 48 year old right-handed woman with an underlying medical history of idiopathic intracranial hypertension, anxiety, arthritis, factor V Leiden, recurrent headaches, hypertension, hyperthyroidism, history of neuropathy, and morbid obesity with a BMI of over 55, who presents for follow-up consultation of her obstructive sleep apnea, after recent sleep study testing and getting a new AutoPap machine.  The patient is unaccompanied today.  She has been followed regularly in our clinic by Dr. Ines previously and Katie Forbes, NP, for her neuropathy.  She was last seen in our clinic on 09/18/2023 by Katie Forbes, NP.  She had a home sleep test through our office on 10/11/2023 which showed overall mild obstructive sleep apnea with an AHI of 11.8/h, O2 nadir 75.9% with mild to moderate snoring detected.  She was prescribed a new AutoPap machine.  Her set up date was 11/23/2073.  Her DME company is [employer].  She has a ResMed AirSense 11 AutoSet machine.  Today, 02/18/2024: I reviewed her AutoPap compliance data from 01/18/2024 through 02/16/2024, which is a total of 30 days, during which time she used her machine every night with percent use days greater than 4 hours at 100%, indicating superb compliance with an average usage of 9 hours and 24 minutes, residual AHI at goal at 1.8/h, leak on the low side with the 95th percentile at 1.2 L/min on a pressure range of 5 to 13 cm with EPR of 3, 95th percentile pressure at 11.8 cm.  She reports doing well, she continues to benefit from treatment.  She goes to bed generally between 9 and 10 and rises around 7.  Her husband gets up early for work.  She has been working on weight loss.  She has a sedentary job.  She is on Ozempic  but suffers from severe constipation, tried MiraLAX and is on a supplement called Gruns.  She still has constipation.  She has also tried  Dulcolax which did not help. She is advised to try Senna and may want to talk to her PCP about prescription medicine such as Linzess.  The patient's allergies, current medications, family history, past medical history, past social history, past surgical history and problem list were reviewed and updated as appropriate.   Previously:  07/09/2017: I first met her on 01/06/2017 at the request of Dr. Ines, at which time the patient reported snoring and morning headaches. I suggested we proceed with a sleep study. She had a baseline sleep study on 04/17/2017. I went over her test results with her in detail today. Her sleep latency was delayed at 48 minutes, sleep efficiency reduced at 69.1%, REM latency was delayed at 325.5 minutes. She had an increased percentage of stage I sleep, an increased percentage of slow-wave sleep and a reduced percentage of REM sleep. Total AHI was 8.4 per hour, REM AHI was 11.8 per hour, supine sleep was not achieved. Average oxygen saturation was 96%, nadir was 83%. Time below 89% saturation was 16 minutes. She had no significant PLMS. I suggested treatment of her mild OSA with AutoPap therapy at home.   I reviewed her AutoPap compliance data from 06/08/2017 through 07/07/2017 which is a total of 30 days, during which time she used her AutoPap every night with percent used days greater than 4 hours at 100%, indicating superb compliance with an average usage of 8 hours and 45 minutes, residual AHI  2.3 per hour, 95th percentile pressure at 12.3 cm, leak acceptable with the 95th percentile of 12.4 L/m on a pressure range of 5-13 cm with EPR. She reports feeling about the same, she is able to tolerate AutoPap. She uses a fullface mask. She had a recent increase in her gabapentin  to 600 mg 3 times a day but could not tolerate the daytime dose that high. She reports some improvement in her headache.    01/06/2017: (She) reports snoring and morning headaches. She had an eye exam recently  no papilledema. Tonsillectomy as a child. She is s/p RAI last year in September. Her father has OSA. Her husband has noted leg movements and twitching, while she is asleep.  I reviewed your office note from 12/04/2016. She had blood work today and felt a little lightheaded, improved after a little snack. Epworth sleepiness score is 5 out of 24, fatigue score is 38 out of 63. She is a nonsmoker and does not utilize alcohol, drinks caffeine about 2-3 servings per day. She lives at home with her husband and her 6 year old daughter. She works for a [employer]. She feels tired during the day and not fully rested when she first wakes up. She tries to be embedded before 11. Wakeup time is around 7. She denies telltale symptoms of restless leg syndrome. She had a tonsillectomy as a child. She has nocturia about once per average night. She has had morning headaches. Her father has sleep apnea. Her snoring can be loud and disruptive to her husband's sleep.  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   HTN (hypertension), benign 04/21/2012   Hypertension    Hyperthyroidism    levels were abnormal in Dec- no treatment currently   Hyperthyroidism 04/21/2012   Infertility, female    Leg edema    PCOS (polycystic ovarian syndrome)    Peripheral vascular disease    neuropathy bilateral feet   Prediabetes    Sleep apnea     Her Past Surgical History Is Significant For: Past  Surgical History:  Procedure Laterality Date   CESAREAN SECTION  2007   DILATATION & CURETTAGE/HYSTEROSCOPY WITH MYOSURE N/A 10/17/2016   Procedure: DILATATION & CURETTAGE/HYSTEROSCOPY WITH MYOSURE POLYPECTOMY;  Surgeon: Estelle Service, MD;  Location: [facility];  Service: Gynecology;  Laterality: N/A;  myosure rep will be here confirmed on 10/06/16   DILATION AND CURETTAGE OF UTERUS     polpectomy   INTRAUTERINE DEVICE (IUD) INSERTION N/A 10/17/2016   Procedure: INTRAUTERINE DEVICE (IUD) INSERTION MIRENA;  Surgeon: Estelle Service, MD;  Location: [facility];  Service: Gynecology;  Laterality: N/A;   TONSILLECTOMY      Her Family History Is Significant For: Family History  Problem Relation Age of Onset   Hypertension Mother    Hyperlipidemia Mother    Thyroid  disease Mother    Depression Mother  Anxiety disorder Mother    Obesity Mother    Kidney cancer Mother 82   Heart disease Father    Diabetes Father    Hypertension Father    Hyperlipidemia Father    Obesity Father    Sleep apnea Father    Sleep apnea Paternal Uncle    Diabetes Maternal Grandmother    Hyperlipidemia Maternal Grandmother    Hyperlipidemia Maternal Grandfather    Hyperlipidemia Paternal Grandmother    Diabetes Paternal Grandmother    Cancer Paternal Grandmother    Heart disease Paternal Grandfather     Her Social History Is Significant For: Social History   Socioeconomic History   Marital status: Married    Spouse name: Katie Harris   Number of children: 1   Years of education: Not on file   Highest education level: Some college, no degree  Occupational History   Occupation: land  Tobacco Use   Smoking status: Never   Smokeless tobacco: Never  Vaping Use   Vaping status: Never Used  Substance and Sexual Activity   Alcohol use: No   Drug use: No   Sexual activity: Yes    Birth control/protection: None  Other Topics Concern   Not on file  Social History Narrative   Lives at home  with her husband, child and dog    Right handed   No caffein   Pt works       Social Drivers of Health   Tobacco Use: Low Risk (02/18/2024)   Patient History    Smoking Tobacco Use: Never    Smokeless Tobacco Use: Never    Passive Exposure: Not on file  Financial Resource Strain: Not on file  Food Insecurity: Not on file  Transportation Needs: Not on file  Physical Activity: Not on file  Stress: Not on file  Social Connections: Not on file  Depression (EYV7-0): Not on file  Alcohol Screen: Not on file  Housing: Not on file  Utilities: Not on file  Health Literacy: Not on file    Her Allergies Are:  Allergies[1]:   Her Current Medications Are:  Outpatient Encounter Medications as of 02/18/2024  Medication Sig   Blood Glucose Monitoring Suppl (ONETOUCH VERIO) w/Device KIT Use as instructed to check blood sugar 1X daily   cyclobenzaprine  (FLEXERIL ) 10 MG tablet at bedtime.    DULoxetine (CYMBALTA) 60 MG capsule Take 60 mg by mouth daily.    gabapentin  (NEURONTIN ) 300 MG capsule Take 1 capsule (300 mg total) by mouth in the morning AND 2 capsules (600 mg total) daily with lunch AND 3 capsules (900 mg total) at bedtime.   glucose blood (ONETOUCH VERIO) test strip Use as instructed to check blood sugar 1X daily   Lancets (ONETOUCH ULTRASOFT) lancets Use as instructed to check blood sugar 1X daily   levothyroxine  (SYNTHROID ) 75 MCG tablet TAKE 1 TABLET DAILY BEFORE BREAKFAST   Meloxicam 10 MG CAPS Take 1 tablet by mouth daily.   metFORMIN  (GLUCOPHAGE ) 850 MG tablet TAKE 1 TABLET TWICE A DAY WITH MEALS   metoprolol (LOPRESSOR) 50 MG tablet Take 50 mg by mouth 2 (two) times daily.    Multiple Vitamins-Minerals (ALIVE MULTI-VITAMIN PO) Take by mouth.   pravastatin  (PRAVACHOL ) 20 MG tablet Take 1 tablet (20 mg total) by mouth daily.   Semaglutide , 2 MG/DOSE, (OZEMPIC , 2 MG/DOSE,) 8 MG/3ML SOPN Inject 2 mg into the skin once a week.   topiramate  (TOPAMAX ) 25 MG tablet Take 2 tablets  (50 mg total) by  mouth daily.   VITAMIN D  PO Take by mouth daily.   No facility-administered encounter medications on file as of 02/18/2024.  :  Review of Systems:  Out of a complete 14 point review of systems, all are reviewed and negative with the exception of these symptoms as listed below:  Review of Systems  Objective:  Neurological Exam  Physical Exam Physical Examination:   Vitals:   02/18/24 0738  BP: 133/60  Pulse: 80    General Examination: The patient is a very pleasant 48 y.o. female in no acute distress. She appears well-developed and well-nourished and well groomed.   HEENT: Normocephalic, atraumatic, tracking well-preserved, hearing grossly intact, face is symmetric with normal facial animation.  Speech without dysarthria, hypophonia or voice tremor.  Neck with full range of motion, no carotid bruits.    Chest: Clear to auscultation without wheezing, rhonchi or crackles noted.   Heart: S1+S2+0, regular and normal without murmurs, rubs or gallops noted.    Abdomen: Soft, non-tender and non-distended with normal bowel sounds appreciated on auscultation.   Extremities: There is no pitting edema in the distal lower extremities bilaterally.    Skin: Warm and dry without trophic changes noted.   Musculoskeletal: exam reveals no obvious joint deformities.    Neurologically:  Mental status: The patient is awake, alert and oriented in all 4 spheres. Her immediate and remote memory, attention, language skills and fund of knowledge are appropriate. There is no evidence of aphasia, agnosia, apraxia or anomia. Speech is clear with normal prosody and enunciation. Thought process is linear. Mood is normal and affect is normal.  Cranial nerves II - XII are as described above under HEENT exam.  Motor exam: Normal bulk, moving all 4 extremities without restriction.  No obvious resting or action tremor.   Cerebellar testing: No dysmetria or intention tremor. Sensory exam:  intact to light touch.   Gait, station and balance: She stands easily. No veering to one side is noted. No leaning to one side is noted. Posture is age-appropriate and stance is narrow based. Gait shows normal stride length and normal pace. No problems turning are noted.    Assessment and Plan:  In summary, Katie Harris is a 48 year old right-handed woman with an underlying medical history of idiopathic intracranial hypertension, anxiety, arthritis, factor V Leiden, recurrent headaches, hypertension, hyperthyroidism, history of neuropathy, and morbid obesity with a BMI of over 55, who presents for follow-up consultation of her obstructive sleep apnea, after recent sleep study testing and getting a new AutoPap machine.  Her home sleep test through our office from August 2025 showed overall mild obstructive sleep apnea.  She started treatment with a new machine in September 2025 and is fully compliant with treatment.  She has had significant weight loss over time.  She continues to work on weight loss.  She is commended for treatment adherence.  We reviewed her AutoPap compliance data in detail today.  She is advised to try senna for her chronic constipation and also talk to her PCP about prescription medicine options.  At this juncture, she is advised to follow-up routinely for sleep apnea management in 1 year to see Katie Forbes, NP.  I answered all her questions today and she was in agreement. I spent 30 minutes in total face-to-face time and in reviewing records during pre-charting, more than 50% of which was spent in counseling and coordination of care, reviewing test results, reviewing medications and treatment regimen and/or in discussing or reviewing the diagnosis  of OSA, chronic constipation, the prognosis and treatment options. Pertinent laboratory and imaging test results that were available during this visit with the patient were reviewed by me and considered in my medical decision making (see chart  for details).      [1] No Known Allergies

## 2024-02-29 NOTE — Telephone Encounter (Signed)
 Pharmacy Patient Advocate Encounter  Received notification from [employer] that Prior Authorization for Ozempic  (2 MG/DOSE) 8MG /3ML pen-injectors  has been APPROVED from 12/22/2023 to 12/21/2024   PA #/Case ID/Reference #: [id number]

## 2024-07-22 ENCOUNTER — Ambulatory Visit: Admitting: Internal Medicine

## 2025-02-20 ENCOUNTER — Ambulatory Visit: Admitting: Neurology
